# Patient Record
Sex: Male | Born: 1960 | Race: White | Hispanic: No | Smoking: Current some day smoker
Health system: Southern US, Community
[De-identification: ages and names within clinical notes are randomized; demographics above are authoritative.]

## PROBLEM LIST (undated history)

## (undated) DIAGNOSIS — J45909 Unspecified asthma, uncomplicated: Secondary | ICD-10-CM

## (undated) DIAGNOSIS — J189 Pneumonia, unspecified organism: Secondary | ICD-10-CM

## (undated) DIAGNOSIS — G893 Neoplasm related pain (acute) (chronic): Secondary | ICD-10-CM

## (undated) DIAGNOSIS — I10 Essential (primary) hypertension: Secondary | ICD-10-CM

## (undated) DIAGNOSIS — E119 Type 2 diabetes mellitus without complications: Secondary | ICD-10-CM

## (undated) DIAGNOSIS — C3490 Malignant neoplasm of unspecified part of unspecified bronchus or lung: Secondary | ICD-10-CM

## (undated) DIAGNOSIS — J449 Chronic obstructive pulmonary disease, unspecified: Secondary | ICD-10-CM

## (undated) HISTORY — PX: FRACTURE SURGERY: SHX138

## (undated) HISTORY — PX: TONSILLECTOMY: SUR1361

## (undated) HISTORY — DX: Neoplasm related pain (acute) (chronic): G89.3

---

## 2008-11-25 ENCOUNTER — Emergency Department: Payer: Self-pay | Admitting: Unknown Physician Specialty

## 2015-05-15 ENCOUNTER — Emergency Department
Admission: EM | Admit: 2015-05-15 | Discharge: 2015-05-15 | Disposition: A | Discharge status: Home / Self Care | Payer: 59 | Attending: Emergency Medicine | Admitting: Emergency Medicine

## 2015-05-15 ENCOUNTER — Encounter: Payer: Self-pay | Admitting: Medical Oncology

## 2015-05-15 DIAGNOSIS — I10 Essential (primary) hypertension: Secondary | ICD-10-CM | POA: Diagnosis not present

## 2015-05-15 DIAGNOSIS — E1165 Type 2 diabetes mellitus with hyperglycemia: Secondary | ICD-10-CM | POA: Diagnosis present

## 2015-05-15 DIAGNOSIS — F17200 Nicotine dependence, unspecified, uncomplicated: Secondary | ICD-10-CM | POA: Insufficient documentation

## 2015-05-15 DIAGNOSIS — R110 Nausea: Secondary | ICD-10-CM | POA: Diagnosis not present

## 2015-05-15 DIAGNOSIS — R739 Hyperglycemia, unspecified: Secondary | ICD-10-CM

## 2015-05-15 HISTORY — DX: Chronic obstructive pulmonary disease, unspecified: J44.9

## 2015-05-15 HISTORY — DX: Essential (primary) hypertension: I10

## 2015-05-15 HISTORY — DX: Type 2 diabetes mellitus without complications: E11.9

## 2015-05-15 LAB — CBC
HEMATOCRIT: 52.3 % — AB (ref 40.0–52.0)
Hemoglobin: 17.4 g/dL (ref 13.0–18.0)
MCH: 29.3 pg (ref 26.0–34.0)
MCHC: 33.2 g/dL (ref 32.0–36.0)
MCV: 88.2 fL (ref 80.0–100.0)
PLATELETS: 186 10*3/uL (ref 150–440)
RBC: 5.93 MIL/uL — AB (ref 4.40–5.90)
RDW: 12.7 % (ref 11.5–14.5)
WBC: 8.2 10*3/uL (ref 3.8–10.6)

## 2015-05-15 LAB — BASIC METABOLIC PANEL
Anion gap: 12 (ref 5–15)
BUN: 20 mg/dL (ref 6–20)
CHLORIDE: 86 mmol/L — AB (ref 101–111)
CO2: 29 mmol/L (ref 22–32)
CREATININE: 0.96 mg/dL (ref 0.61–1.24)
Calcium: 8.7 mg/dL — ABNORMAL LOW (ref 8.9–10.3)
GFR calc non Af Amer: >60 mL/min (ref >60)
Glucose, Bld: 446 mg/dL — ABNORMAL HIGH (ref 65–99)
POTASSIUM: 3.5 mmol/L (ref 3.5–5.1)
SODIUM: 127 mmol/L — AB (ref 135–145)

## 2015-05-15 LAB — URINALYSIS COMPLETE WITH MICROSCOPIC (ARMC ONLY)
BILIRUBIN URINE: NEGATIVE
Glucose, UA: >500 mg/dL — AB
Nitrite: NEGATIVE
PH: 5 (ref 5.0–8.0)
PROTEIN: 100 mg/dL — AB
Specific Gravity, Urine: 1.02 (ref 1.005–1.030)

## 2015-05-15 LAB — GLUCOSE, CAPILLARY
GLUCOSE-CAPILLARY: 399 mg/dL — AB (ref 65–99)
GLUCOSE-CAPILLARY: 413 mg/dL — AB (ref 65–99)
GLUCOSE-CAPILLARY: 371 mg/dL — AB (ref 65–99)

## 2015-05-15 MED ORDER — SODIUM CHLORIDE 0.9 % IV BOLUS (SEPSIS)
1000.0000 mL | INTRAVENOUS | Status: AC
  Administered 2015-05-15: 1000 mL via INTRAVENOUS

## 2015-05-15 NOTE — ED Provider Notes (Signed)
[facility] Emergency Department Provider Note  ____________________________________________  Time seen: Approximately 6:54 PM  I have reviewed the triage vital signs and the nursing notes.   HISTORY  Chief Complaint Hyperglycemia    HPI Gabriel Mendez is a 54 y.o. male with a history of diabetes on oral medication, hypertension, and COPD who presents with hyperglycemia.  He is reportedly to his primary care doctor and they checked his blood sugar and found it is greater than 470 they sent him to the emergency Department.  In general he feels well except that over the last few days he has had occasional episodes of nausea.  He denies headache, visual changes, weakness, chest pain, shortness of breath, abdominal pain, vomiting, dysuria.  He has been urinating more than usual.  Thanksgiving was last week, and he said he has not been sticking to his diabetic diet at all and has had many dietary indiscretions.  He has also been drinking a lot of (high-calorie) Gatorade recently.  A blood sugar greater than 400 consult to do's severe for him.  His dietary instructions are making it worse and nothing is making it better.   Past Medical History  Diagnosis Date  . Diabetes mellitus without complication ([place])   . Hypertension   . COPD (chronic obstructive pulmonary disease) ([facility])     There are no active problems to display for this patient.   History reviewed. No pertinent past surgical history.  No current outpatient prescriptions on file.  Allergies Shrimp  No family history on file.  Social History Social History  Substance Use Topics  . Smoking status: Current Every Day Smoker  . Smokeless tobacco: None  . Alcohol Use: No    Review of Systems Constitutional: No fever/chills Eyes: No visual changes. ENT: No sore throat. Cardiovascular: Denies chest pain. Respiratory: Denies shortness of breath. Gastrointestinal: No abdominal pain.  nausea, no  vomiting.  No diarrhea.  No constipation. Genitourinary: Negative for dysuria. Musculoskeletal: Negative for back pain. Skin: Negative for rash. Neurological: Negative for headaches, focal weakness or numbness.  10-point ROS otherwise negative.  ____________________________________________   PHYSICAL EXAM:  VITAL SIGNS: ED Triage Vitals  Enc Vitals Group     BP 05/15/15 1731 156/100 mmHg     Pulse Rate 05/15/15 1731 88     Resp 05/15/15 1731 18     Temp 05/15/15 1731 98.8 F (37.1 C)     Temp Source 05/15/15 1731 Oral     SpO2 05/15/15 1731 95 %     Weight 05/15/15 1731 208 lb (94.348 kg)     Height 05/15/15 1731 '5\' 8"'$  (1.727 m)     Head Cir --      Peak Flow --      Pain Score 05/15/15 1731 5     Pain Loc --      Pain Edu? --      Excl. in [place]? --     Constitutional: Alert and oriented. Well appearing and in no acute distress. Eyes: Conjunctivae are normal. PERRL. EOMI. Head: Atraumatic. Nose: No congestion/rhinnorhea. Mouth/Throat: Mucous membranes are moist.  Oropharynx non-erythematous. Neck: No stridor.   Cardiovascular: Normal rate, regular rhythm. Grossly normal heart sounds.  Good peripheral circulation. Respiratory: Normal respiratory effort.  No retractions. Lungs CTAB. Gastrointestinal: Soft and nontender. No distention. No abdominal bruits. No CVA tenderness. Musculoskeletal: No lower extremity tenderness nor edema.  No joint effusions. Neurologic:  Normal speech and language. No gross focal neurologic deficits are appreciated.  Skin:  Skin is warm, dry and intact. No rash noted. Psychiatric: Mood and affect are normal. Speech and behavior are normal.  ____________________________________________   LABS (all labs ordered are listed, but only abnormal results are displayed)  Labs Reviewed  BASIC METABOLIC PANEL - Abnormal; Notable for the following:    Sodium 127 (*)    Chloride 86 (*)    Glucose, Bld 446 (*)    Calcium 8.7 (*)    All other  components within normal limits  CBC - Abnormal; Notable for the following:    RBC 5.93 (*)    HCT 52.3 (*)    All other components within normal limits  GLUCOSE, CAPILLARY - Abnormal; Notable for the following:    Glucose-Capillary 399 (*)    All other components within normal limits  GLUCOSE, CAPILLARY - Abnormal; Notable for the following:    Glucose-Capillary 413 (*)    All other components within normal limits  URINALYSIS COMPLETEWITH MICROSCOPIC ([facility] ONLY)  CBG MONITORING, ED   ____________________________________________  EKG  Not indicated ____________________________________________  RADIOLOGY   No results found.  ____________________________________________   PROCEDURES  Procedure(s) performed: None  Critical Care performed: No ____________________________________________   INITIAL IMPRESSION / ASSESSMENT AND PLAN / ED COURSE  Pertinent labs & imaging results that were available during my care of the patient were reviewed by me and considered in my medical decision making (see chart for details).  The patient's blood sugar is elevated he is otherwise well and asymptomatic.  He is not in DKA and his anion gap is within normal limits.  I explained to him that we will give him a liter of fluids and see if we can bring his blood sugar down somewhat, but that IV insulin is not indicated.  Mostly he needs to be hydrated with water, stick to his diabetic diet, follow up as an outpatient.  The patient understands and agrees and is comfortably watching TV at this time.  ----------------------------------------- 9:26 PM on 05/15/2015 -----------------------------------------  Patient remains asymptomatic.  Blood sugar remains elevated at nearly 400, but I have no medical reason to keep the patient, and he very much wants to go home.  I gave my usual customary return precautions.  ____________________________________________  FINAL CLINICAL IMPRESSION(S) / ED  DIAGNOSES  Final diagnoses:  Hyperglycemia      NEW MEDICATIONS STARTED DURING THIS VISIT:  New Prescriptions   No medications on file     Hinda Kehr, MD 05/15/15 2126

## 2015-05-15 NOTE — Discharge Instructions (Signed)
As we discussed, though your blood sugar is running high, it is not dangerous at this time.  Making adjustments in the Emergency Department (ED) and possibly causing your glucose level to drop too low is more dangerous than continuing your current medications at this time until you can follow up with your clinic doctor.  Please continue your medications and follow up with your regular doctor as recommended in these documents.  If you develop new or worsening symptoms that concern you, please return to the Emergency Department.   Hyperglycemia Hyperglycemia occurs when the glucose (sugar) in your blood is too high. Hyperglycemia can happen for many reasons, but it most often happens to people who do not know they have diabetes or are not managing their diabetes properly.  CAUSES  Whether you have diabetes or not, there are other causes of hyperglycemia. Hyperglycemia can occur when you have diabetes, but it can also occur in other situations that you might not be as aware of, such as: Diabetes  If you have diabetes and are having problems controlling your blood glucose, hyperglycemia could occur because of some of the following reasons:  Not following your meal plan.  Not taking your diabetes medications or not taking it properly.  Exercising less or doing less activity than you normally do.  Being sick. Pre-diabetes  This cannot be ignored. Before people develop Type 2 diabetes, they almost always have "pre-diabetes." This is when your blood glucose levels are higher than normal, but not yet high enough to be diagnosed as diabetes. Research has shown that some long-term damage to the body, especially the heart and circulatory system, may already be occurring during pre-diabetes. If you take action to manage your blood glucose when you have pre-diabetes, you may delay or prevent Type 2 diabetes from developing. Stress  If you have diabetes, you may be "diet" controlled or on oral medications  or insulin to control your diabetes. However, you may find that your blood glucose is higher than usual in the hospital whether you have diabetes or not. This is often referred to as "stress hyperglycemia." Stress can elevate your blood glucose. This happens because of hormones put out by the body during times of stress. If stress has been the cause of your high blood glucose, it can be followed regularly by your caregiver. That way he/she can make sure your hyperglycemia does not continue to get worse or progress to diabetes. Steroids  Steroids are medications that act on the infection fighting system (immune system) to block inflammation or infection. One side effect can be a rise in blood glucose. Most people can produce enough extra insulin to allow for this rise, but for those who cannot, steroids make blood glucose levels go even higher. It is not unusual for steroid treatments to "uncover" diabetes that is developing. It is not always possible to determine if the hyperglycemia will go away after the steroids are stopped. A special blood test called an A1c is sometimes done to determine if your blood glucose was elevated before the steroids were started. SYMPTOMS  Thirsty.  Frequent urination.  Dry mouth.  Blurred vision.  Tired or fatigue.  Weakness.  Sleepy.  Tingling in feet or leg. DIAGNOSIS  Diagnosis is made by monitoring blood glucose in one or all of the following ways:  A1c test. This is a chemical found in your blood.  Fingerstick blood glucose monitoring.  Laboratory results. TREATMENT  First, knowing the cause of the hyperglycemia is important before the  hyperglycemia can be treated. Treatment may include, but is not be limited to:  Education.  Change or adjustment in medications.  Change or adjustment in meal plan.  Treatment for an illness, infection, etc.  More frequent blood glucose monitoring.  Change in exercise plan.  Decreasing or stopping  steroids.  Lifestyle changes. HOME CARE INSTRUCTIONS   Test your blood glucose as directed.  Exercise regularly. Your caregiver will give you instructions about exercise. Pre-diabetes or diabetes which comes on with stress is helped by exercising.  Eat wholesome, balanced meals. Eat often and at regular, fixed times. Your caregiver or nutritionist will give you a meal plan to guide your sugar intake.  Being at an ideal weight is important. If needed, losing as little as 10 to 15 pounds may help improve blood glucose levels. SEEK MEDICAL CARE IF:   You have questions about medicine, activity, or diet.  You continue to have symptoms (problems such as increased thirst, urination, or weight gain). SEEK IMMEDIATE MEDICAL CARE IF:   You are vomiting or have diarrhea.  Your breath smells fruity.  You are breathing faster or slower.  You are very sleepy or incoherent.  You have numbness, tingling, or pain in your feet or hands.  You have chest pain.  Your symptoms get worse even though you have been following your caregiver's orders.  If you have any other questions or concerns.   This information is not intended to replace advice given to you by your health care provider. Make sure you discuss any questions you have with your health care provider.   Document Released: 11/27/2000 Document Revised: 08/26/2011 Document Reviewed: 02/07/2015 Elsevier Interactive Patient Education [employer].

## 2015-05-15 NOTE — ED Notes (Signed)
Pt reports that he was going to [facility] for nausea when they checked his sugar and was told it was 413. Pt reports body aches.

## 2017-04-16 ENCOUNTER — Telehealth: Payer: Self-pay | Admitting: *Deleted

## 2017-04-16 NOTE — Telephone Encounter (Signed)
Attempted to call patient and notified that imaging needs to be put on a disk (CD).

## 2017-04-18 ENCOUNTER — Ambulatory Visit (INDEPENDENT_AMBULATORY_CARE_PROVIDER_SITE_OTHER): Payer: BLUE CROSS/BLUE SHIELD | Admitting: Internal Medicine

## 2017-04-18 ENCOUNTER — Encounter: Payer: Self-pay | Admitting: Internal Medicine

## 2017-04-18 VITALS — BP 144/100 | HR 88 | Ht 68.0 in

## 2017-04-18 DIAGNOSIS — F17210 Nicotine dependence, cigarettes, uncomplicated: Secondary | ICD-10-CM

## 2017-04-18 DIAGNOSIS — J42 Unspecified chronic bronchitis: Secondary | ICD-10-CM

## 2017-04-18 DIAGNOSIS — J181 Lobar pneumonia, unspecified organism: Secondary | ICD-10-CM | POA: Diagnosis not present

## 2017-04-18 NOTE — Patient Instructions (Addendum)
--  will check CT chest.  --Please get a copy of your xrays on a CD for me to review.   --Quitting smoking is the most important thing that you can do for your health.  --Quitting smoking will have greater affect on your health than any medicine that we can give you.   --The best way to quit is to set a quit date, usually a day that has meaning like someone's birthday.  --Start any medication prescribed for quitting one week before you quit date. Then toss out the cigarettes on your quit date.  --If you start smoking again, start from scratch--set another quit day and try again!

## 2017-04-18 NOTE — Progress Notes (Addendum)
[facility] Consultation      Assessment and Plan:  56 year old male with persistent changes of pneumonia.  Pneumonia versus lung mass.   --Non-resolving pneumonia concerning for resistant organism or post obstructive pneumonia.  --Per imaging report, the patient has a large area of pneumonia, images were not available for review today.  --Will send pt for CT chest, and asked that he bring in outside CXR films on CD for my review.  --Discussed with patient that I will contact him once he has had the CT chest, he may need a bronchoscopy vs. Continued surveillance.   Chronic bronchitis --Pt has wheezing on exam today, but has no complaints of dyspnea. Therefore will not prescribe inhaler at this time, his best intervention would be smoking cessation.  --Will need a PFT, will consider after viewing CT chest.   Nicotine abuse. --Discussed the importance of smoking cessation, spent > greater than 3 min in discussion.   Addendum 05/01/17; Images personally reviewed, outside chest x-ray 04/15/17 there is a right mid zone infiltrate; CT chest 04/28/17, there is consolidation/atelectasis of the predominantly anterior segment of the right upper lobe, there appears to be cut off of the right upper lobe bronchus.  There is an enlarged right paratracheal node, enlarged right 10 R/mass which wraps anterior to the right main stem bronchus, 11 R node, mild subcarinal, which may be contiguous with the right paratracheal node lymphadenopathy.  ---------Will need to schedule biopsy.    Date: 04/18/2017  MRN# [id number] NEEKO PHARO Nov 09, 1960  Referring Physician: [facility]  AUSTAN NICHOLL is a 56 y.o. old male seen in consultation for chief complaint of:    Chief Complaint  Patient presents with  . Advice Only    prod cough; recent PNA:     HPI:   His problems started in June, he was coughing up phlegm, and had a lot of chest congestion. He was coughing up green and  yellow sputum. He went to his doctor, got a Zpack, which helped somewhat but did not get better, went back and got a CXR which showed pneumonia. He was found to have pneumonia, got a course of augmentin for 10 days and felt that it helped tremendously. A follow up CXR report 2 weeks ago showed mild improvement but it still persists. He was then referred there.  He has not lost weight. He is smoking just over a ppd. He would like to quit but does not think that he is able. He works as a Pharmacist, community.  No family or personal history of cancer. He has had 4 broken ribs on the right side.  He denies reflux. He denies dyspnea. He does not snore at night and is not sleepy during the day.   Images are not currently available for review.  Review of reports, chest x-ray April 01, 2017, right anterior mid lung 7 cm x 4 cm x 11 cm irregular consolidation.   PMHX:   Past Medical History:  Diagnosis Date  . COPD (chronic obstructive pulmonary disease) ([place])   . Diabetes mellitus without complication ([place])   . Hypertension    Surgical Hx:  History reviewed. No pertinent surgical history. Family Hx:  History reviewed. No pertinent family history. Social Hx:   Social History  Substance Use Topics  . Smoking status: Current Every Day Smoker    Packs/day: 1.50  . Smokeless tobacco: Never Used  . Alcohol use No   Medication:    Current Outpatient Prescriptions:  .  acetaminophen (TYLENOL) 325 MG tablet, Take 650 mg by mouth every 8 (eight) hours as needed for mild pain., Disp: , Rfl:  .  albuterol (ACCUNEB) 0.63 MG/3ML nebulizer solution, Take 1 ampule by nebulization every 6 (six) hours as needed for wheezing., Disp: , Rfl:  .  albuterol (PROVENTIL HFA;VENTOLIN HFA) 108 (90 BASE) MCG/ACT inhaler, Inhale 2 puffs into the lungs every 6 (six) hours as needed for wheezing or shortness of breath., Disp: , Rfl:  .  beclomethasone (QVAR) 80 MCG/ACT inhaler, Inhale 1 puff into the lungs 2 (two) times daily.,  Disp: , Rfl:  .  glipiZIDE (GLUCOTROL) 5 MG tablet, Take 5 mg by mouth daily before breakfast., Disp: , Rfl:  .  lisinopril-hydrochlorothiazide (PRINZIDE,ZESTORETIC) 20-25 MG tablet, Take 1 tablet by mouth daily., Disp: , Rfl:  .  metFORMIN (GLUCOPHAGE) 1000 MG tablet, Take 1,000 mg by mouth 2 (two) times daily., Disp: , Rfl:    Allergies:  Shrimp [shellfish allergy]  Review of Systems: Gen:  Denies  fever, sweats, chills HEENT: Denies blurred vision, double vision. bleeds, sore throat Cvc:  No dizziness, chest pain. Resp:   Denies cough or sputum production, shortness of breath Gi: Denies swallowing difficulty, stomach pain. Gu:  Denies bladder incontinence, burning urine Ext:   No Joint pain, stiffness. Skin: No skin rash,  hives  Endoc:  No polyuria, polydipsia. Psych: No depression, insomnia. Other:  All other systems were reviewed with the patient and were negative other that what is mentioned in the HPI.   Physical Examination:   VS: BP (!) 144/100 (BP Location: Left Arm, Cuff Size: Normal)   Pulse 88   Ht 5\' 8"  (1.727 m)   SpO2 98%   General Appearance: No distress  Neuro:without focal findings,  speech normal,  HEENT: PERRLA, EOM intact.   Pulmonary: normal breath sounds, No wheezing.  CardiovascularNormal S1,S2.  No m/r/g.   Abdomen: Benign, Soft, non-tender. Renal:  No costovertebral tenderness  GU:  No performed at this time. Endoc: No evident thyromegaly, no signs of acromegaly. Skin:   warm, no rashes, no ecchymosis  Extremities: normal, no cyanosis, clubbing.  Other findings:    LABORATORY PANEL:   CBC No results for input(s): WBC, HGB, HCT, PLT in the last 168 hours. ------------------------------------------------------------------------------------------------------------------  Chemistries  No results for input(s): NA, K, CL, CO2, GLUCOSE, BUN, CREATININE, CALCIUM, MG, AST, ALT, ALKPHOS, BILITOT in the last 168 hours.  Invalid input(s):  GFRCGP ------------------------------------------------------------------------------------------------------------------  Cardiac Enzymes No results for input(s): TROPONINI in the last 168 hours. ------------------------------------------------------------  RADIOLOGY:  No results found.     Thank  you for the consultation and for allowing [facility] to assist in the care of your patient. Our recommendations are noted above.  Please contact us if we can be of further service.   Marda Stalker, MD.  Board Certified in Internal Medicine, Pulmonary Medicine, [place], and Sleep Medicine.  [facility] Office Number: [phone]  Patricia Pesa, M.D.  Merton Border, M.D  04/18/2017

## 2017-04-18 NOTE — H&P (View-Only) (Signed)
[name]      Assessment and Plan:  56 year old male with persistent changes of pneumonia.  Pneumonia versus lung mass.   --Non-resolving pneumonia concerning for resistant organism or post obstructive pneumonia.  --Per imaging report, the patient has a large area of pneumonia, images were not available for review today.  --Will send pt for CT chest, and asked that he bring in outside CXR films on CD for my review.  --Discussed with patient that I will contact him once he has had the CT chest, he may need a bronchoscopy vs. Continued surveillance.   Chronic bronchitis --Pt has wheezing on exam today, but has no complaints of dyspnea. Therefore will not prescribe inhaler at this time, his best intervention would be smoking cessation.  --Will need a PFT, will consider after viewing CT chest.   Nicotine abuse. --Discussed the importance of smoking cessation, spent > greater than 3 min in discussion.   Addendum 05/01/17; Images personally reviewed, outside chest x-ray 04/15/17 there is a right mid zone infiltrate; CT chest 04/28/17, there is consolidation/atelectasis of the predominantly anterior segment of the right upper lobe, there appears to be cut off of the right upper lobe bronchus.  There is an enlarged right paratracheal node, enlarged right 10 R/mass which wraps anterior to the right main stem bronchus, 11 R node, mild subcarinal, which may be contiguous with the right paratracheal node lymphadenopathy.  ---------Will need to schedule biopsy.    Date: 04/18/2017  MRN# [id number] Gabriel Mendez Jul 18, 1960  Referring Physician: [facility]  Gabriel Mendez is a 56 y.o. old male seen in consultation for chief complaint of:    Chief Complaint  Patient presents with  . Advice Only    prod cough; recent PNA:     HPI:   His problems started in June, he was coughing up phlegm, and had a lot of chest congestion. He was coughing up green and  yellow sputum. He went to his doctor, got a Zpack, which helped somewhat but did not get better, went back and got a CXR which showed pneumonia. He was found to have pneumonia, got a course of augmentin for 10 days and felt that it helped tremendously. A follow up CXR report 2 weeks ago showed mild improvement but it still persists. He was then referred there.  He has not lost weight. He is smoking just over a ppd. He would like to quit but does not think that he is able. He works as a Pharmacist, community.  No family or personal history of cancer. He has had 4 broken ribs on the right side.  He denies reflux. He denies dyspnea. He does not snore at night and is not sleepy during the day.   Images are not currently available for review.  Review of reports, chest x-ray April 01, 2017, right anterior mid lung 7 cm x 4 cm x 11 cm irregular consolidation.   PMHX:   Past Medical History:  Diagnosis Date  . COPD (chronic obstructive pulmonary disease) ([place])   . Diabetes mellitus without complication ([place])   . Hypertension    Surgical Hx:  History reviewed. No pertinent surgical history. Family Hx:  History reviewed. No pertinent family history. Social Hx:   Social History  Substance Use Topics  . Smoking status: Current Every Day Smoker    Packs/day: 1.50  . Smokeless tobacco: Never Used  . Alcohol use No   Medication:    Current Outpatient Prescriptions:  .  acetaminophen (TYLENOL) 325 MG tablet, Take 650 mg by mouth every 8 (eight) hours as needed for mild pain., Disp: , Rfl:  .  albuterol (ACCUNEB) 0.63 MG/3ML nebulizer solution, Take 1 ampule by nebulization every 6 (six) hours as needed for wheezing., Disp: , Rfl:  .  albuterol (PROVENTIL HFA;VENTOLIN HFA) 108 (90 BASE) MCG/ACT inhaler, Inhale 2 puffs into the lungs every 6 (six) hours as needed for wheezing or shortness of breath., Disp: , Rfl:  .  beclomethasone (QVAR) 80 MCG/ACT inhaler, Inhale 1 puff into the lungs 2 (two) times daily.,  Disp: , Rfl:  .  glipiZIDE (GLUCOTROL) 5 MG tablet, Take 5 mg by mouth daily before breakfast., Disp: , Rfl:  .  lisinopril-hydrochlorothiazide (PRINZIDE,ZESTORETIC) 20-25 MG tablet, Take 1 tablet by mouth daily., Disp: , Rfl:  .  metFORMIN (GLUCOPHAGE) 1000 MG tablet, Take 1,000 mg by mouth 2 (two) times daily., Disp: , Rfl:    Allergies:  Shrimp [shellfish allergy]  Review of Systems: Gen:  Denies  fever, sweats, chills HEENT: Denies blurred vision, double vision. bleeds, sore throat Cvc:  No dizziness, chest pain. Resp:   Denies cough or sputum production, shortness of breath Gi: Denies swallowing difficulty, stomach pain. Gu:  Denies bladder incontinence, burning urine Ext:   No Joint pain, stiffness. Skin: No skin rash,  hives  Endoc:  No polyuria, polydipsia. Psych: No depression, insomnia. Other:  All other systems were reviewed with the patient and were negative other that what is mentioned in the HPI.   Physical Examination:   VS: BP (!) 144/100 (BP Location: Left Arm, Cuff Size: Normal)   Pulse 88   Ht 5\' 8"  (1.727 m)   SpO2 98%   General Appearance: No distress  Neuro:without focal findings,  speech normal,  HEENT: PERRLA, EOM intact.   Pulmonary: normal breath sounds, No wheezing.  CardiovascularNormal S1,S2.  No m/r/g.   Abdomen: Benign, Soft, non-tender. Renal:  No costovertebral tenderness  GU:  No performed at this time. Endoc: No evident thyromegaly, no signs of acromegaly. Skin:   warm, no rashes, no ecchymosis  Extremities: normal, no cyanosis, clubbing.  Other findings:    LABORATORY PANEL:   CBC No results for input(s): WBC, HGB, HCT, PLT in the last 168 hours. ------------------------------------------------------------------------------------------------------------------  Chemistries  No results for input(s): NA, K, CL, CO2, GLUCOSE, BUN, CREATININE, CALCIUM, MG, AST, ALT, ALKPHOS, BILITOT in the last 168 hours.  Invalid input(s):  GFRCGP ------------------------------------------------------------------------------------------------------------------  Cardiac Enzymes No results for input(s): TROPONINI in the last 168 hours. ------------------------------------------------------------  RADIOLOGY:  No results found.     Thank  you for the consultation and for allowing [facility] to assist in the care of your patient. Our recommendations are noted above.  Please contact us if we can be of further service.   Marda Stalker, MD.  Board Certified in Internal Medicine, Pulmonary Medicine, [place], and Sleep Medicine.  [facility] Office Number: [phone]  Patricia Pesa, M.D.  Merton Border, M.D  04/18/2017

## 2017-04-28 ENCOUNTER — Ambulatory Visit
Admission: RE | Admit: 2017-04-28 | Discharge: 2017-04-28 | Disposition: A | Discharge status: Home / Self Care | Payer: BLUE CROSS/BLUE SHIELD | Source: Ambulatory Visit | Attending: Internal Medicine | Admitting: Internal Medicine

## 2017-04-28 ENCOUNTER — Telehealth: Payer: Self-pay | Admitting: Internal Medicine

## 2017-04-28 DIAGNOSIS — J181 Lobar pneumonia, unspecified organism: Secondary | ICD-10-CM | POA: Diagnosis present

## 2017-04-28 DIAGNOSIS — I2510 Atherosclerotic heart disease of native coronary artery without angina pectoris: Secondary | ICD-10-CM | POA: Insufficient documentation

## 2017-04-28 DIAGNOSIS — J42 Unspecified chronic bronchitis: Secondary | ICD-10-CM | POA: Diagnosis present

## 2017-04-28 DIAGNOSIS — I700 Atherosclerosis of aorta: Secondary | ICD-10-CM | POA: Insufficient documentation

## 2017-04-28 LAB — POCT I-STAT CREATININE: Creatinine, Ser: 0.8 mg/dL (ref 0.61–1.24)

## 2017-04-28 MED ORDER — IOPAMIDOL (ISOVUE-300) INJECTION 61%
75.0000 mL | Freq: Once | INTRAVENOUS | Status: AC | PRN
  Administered 2017-04-28: 75 mL via INTRAVENOUS

## 2017-04-28 NOTE — Telephone Encounter (Signed)
PT dropped off disc with chest x-ray and a letter from Radiologist  Placed in Nurse Box

## 2017-04-28 NOTE — Telephone Encounter (Signed)
Disc and report placed in DR's folder for review. Nothing further needed.

## 2017-05-01 ENCOUNTER — Other Ambulatory Visit: Payer: Self-pay | Admitting: Internal Medicine

## 2017-05-01 ENCOUNTER — Telehealth: Payer: Self-pay | Admitting: *Deleted

## 2017-05-01 DIAGNOSIS — R918 Other nonspecific abnormal finding of lung field: Secondary | ICD-10-CM

## 2017-05-01 NOTE — Telephone Encounter (Signed)
-----   Message from Laverle Hobby, MD sent at 05/01/2017  9:17 AM EST ----- Regarding: Pls schedule EBUS Spoke with Mr Schoch about CT chest and CXR, will need to schedule EBUS bronchoscopy (no fluoro).

## 2017-05-02 ENCOUNTER — Telehealth: Payer: Self-pay | Admitting: *Deleted

## 2017-05-02 NOTE — Telephone Encounter (Signed)
Called patient and made aware CXR disc has been reviewed and ready for pick. It will be placed at front desk. Nothing further needed.

## 2017-05-05 ENCOUNTER — Telehealth: Payer: Self-pay | Admitting: *Deleted

## 2017-05-05 NOTE — Telephone Encounter (Signed)
EBUS --- Ramachandran scheduled for 05/12/17 at 1 pm. Pre-Admission 05/07/17 by phone 1-5.  Patient aware

## 2017-05-07 ENCOUNTER — Encounter
Admission: RE | Admit: 2017-05-07 | Discharge: 2017-05-07 | Disposition: A | Discharge status: Home / Self Care | Payer: BLUE CROSS/BLUE SHIELD | Source: Ambulatory Visit | Attending: Internal Medicine | Admitting: Internal Medicine

## 2017-05-07 ENCOUNTER — Other Ambulatory Visit: Payer: Self-pay

## 2017-05-07 HISTORY — DX: Pneumonia, unspecified organism: J18.9

## 2017-05-07 NOTE — Patient Instructions (Signed)
Your procedure is scheduled on: 05/12/17 Report to Day Surgery. [facility] SECOND FLOOR To find out your arrival time please call [phone] between 1PM - 3PM on 05/09/17.  Remember: Instructions that are not followed completely may result in serious medical risk, up to and including death, or upon the discretion of your surgeon and anesthesiologist your surgery may need to be rescheduled.     _X__ 1. Do not eat food after midnight the night before your procedure.                 No gum chewing or hard candies. You may drink clear liquids up to 2 hours                 before you are scheduled to arrive for your surgery- DO not drink clear                 liquids within 2 hours of the start of your surgery.                 Clear Liquids include:  water, apple juice without pulp, clear carbohydrate                 drink such as Clearfast of Gartorade, Black Coffee or Tea (Do not add                 anything to coffee or tea).     _X__ 2.  No Alcohol for 24 hours before or after surgery.   _X__ 3.  Do Not Smoke or use e-cigarettes For 24 Hours Prior to Your Surgery.                 Do not use any chewable tobacco products for at least 6 hours prior to                 surgery.  ____  4.  Bring all medications with you on the day of surgery if instructed.   __X__  5.  Notify your doctor if there is any change in your medical condition      (cold, fever, infections).     Do not wear jewelry, make-up, hairpins, clips or nail polish. Do not wear lotions, powders, or perfumes. You may wear deodorant. Do not shave 48 hours prior to surgery. Men may shave face and neck. Do not bring valuables to the hospital.    [facility] is not responsible for any belongings or valuables.  Contacts, dentures or bridgework may not be worn into surgery. Leave your suitcase in the car. After surgery it may be brought to your room. For patients admitted to the hospital, discharge  time is determined by your treatment team.   Patients discharged the day of surgery will not be allowed to drive home.   ____ Take these medicines the morning of surgery with A SIP OF WATER:    1.  2.   3.   4.  5.  6.  ____ Fleet Enema (as directed)   ____ Use CHG Soap as directed  _X___ Use inhalers on the day of surgery    DO NEB TX AM SURGERY AND BRING INHALERS  __X__ Stop metformin 2 days prior to surgery    ____ Take 1/2 of usual insulin dose the night before surgery. No insulin the morning          of surgery.   ____ Stop Coumadin/Plavix/aspirin on  ____ Stop Anti-inflammatories on  ____ Stop supplements until after surgery.    ____ Bring C-Pap to the hospital.

## 2017-05-07 NOTE — Pre-Procedure Instructions (Signed)
COIULD NOT COME PREOP  FOR EKG/CBC/METB. ORDERED FOR AM SURGERY

## 2017-05-12 ENCOUNTER — Ambulatory Visit: Payer: BLUE CROSS/BLUE SHIELD

## 2017-05-12 ENCOUNTER — Ambulatory Visit: Payer: BLUE CROSS/BLUE SHIELD | Admitting: Certified Registered"

## 2017-05-12 ENCOUNTER — Encounter
Admission: RE | Disposition: A | Discharge status: Home / Self Care | Payer: Self-pay | Source: Ambulatory Visit | Attending: Internal Medicine

## 2017-05-12 ENCOUNTER — Encounter: Payer: Self-pay | Admitting: *Deleted

## 2017-05-12 ENCOUNTER — Ambulatory Visit
Admission: RE | Admit: 2017-05-12 | Discharge: 2017-05-12 | Disposition: A | Discharge status: Home / Self Care | Payer: BLUE CROSS/BLUE SHIELD | Source: Ambulatory Visit | Attending: Internal Medicine | Admitting: Internal Medicine

## 2017-05-12 DIAGNOSIS — E119 Type 2 diabetes mellitus without complications: Secondary | ICD-10-CM | POA: Diagnosis not present

## 2017-05-12 DIAGNOSIS — Z79899 Other long term (current) drug therapy: Secondary | ICD-10-CM | POA: Insufficient documentation

## 2017-05-12 DIAGNOSIS — C3411 Malignant neoplasm of upper lobe, right bronchus or lung: Secondary | ICD-10-CM | POA: Diagnosis not present

## 2017-05-12 DIAGNOSIS — R918 Other nonspecific abnormal finding of lung field: Secondary | ICD-10-CM | POA: Diagnosis not present

## 2017-05-12 DIAGNOSIS — Z01818 Encounter for other preprocedural examination: Secondary | ICD-10-CM

## 2017-05-12 DIAGNOSIS — Z9889 Other specified postprocedural states: Secondary | ICD-10-CM

## 2017-05-12 DIAGNOSIS — Z8701 Personal history of pneumonia (recurrent): Secondary | ICD-10-CM | POA: Diagnosis not present

## 2017-05-12 DIAGNOSIS — C771 Secondary and unspecified malignant neoplasm of intrathoracic lymph nodes: Secondary | ICD-10-CM | POA: Diagnosis not present

## 2017-05-12 DIAGNOSIS — F17210 Nicotine dependence, cigarettes, uncomplicated: Secondary | ICD-10-CM | POA: Diagnosis not present

## 2017-05-12 DIAGNOSIS — Z7984 Long term (current) use of oral hypoglycemic drugs: Secondary | ICD-10-CM | POA: Insufficient documentation

## 2017-05-12 DIAGNOSIS — J449 Chronic obstructive pulmonary disease, unspecified: Secondary | ICD-10-CM | POA: Insufficient documentation

## 2017-05-12 DIAGNOSIS — Z791 Long term (current) use of non-steroidal anti-inflammatories (NSAID): Secondary | ICD-10-CM | POA: Diagnosis not present

## 2017-05-12 DIAGNOSIS — I10 Essential (primary) hypertension: Secondary | ICD-10-CM | POA: Diagnosis not present

## 2017-05-12 HISTORY — PX: ENDOBRONCHIAL ULTRASOUND: SHX5096

## 2017-05-12 LAB — CBC
HCT: 43 % (ref 40.0–52.0)
Hemoglobin: 14.2 g/dL (ref 13.0–18.0)
MCH: 28.4 pg (ref 26.0–34.0)
MCHC: 33 g/dL (ref 32.0–36.0)
MCV: 86 fL (ref 80.0–100.0)
PLATELETS: 416 10*3/uL (ref 150–440)
RBC: 5 MIL/uL (ref 4.40–5.90)
RDW: 13.3 % (ref 11.5–14.5)
WBC: 14.9 10*3/uL — AB (ref 3.8–10.6)

## 2017-05-12 LAB — BASIC METABOLIC PANEL
ANION GAP: 11 (ref 5–15)
BUN: 16 mg/dL (ref 6–20)
CHLORIDE: 97 mmol/L — AB (ref 101–111)
CO2: 26 mmol/L (ref 22–32)
Calcium: 9.2 mg/dL (ref 8.9–10.3)
Creatinine, Ser: 0.7 mg/dL (ref 0.61–1.24)
GFR calc Af Amer: >60 mL/min (ref >60)
GFR calc non Af Amer: >60 mL/min (ref >60)
GLUCOSE: 201 mg/dL — AB (ref 65–99)
Potassium: 3.9 mmol/L (ref 3.5–5.1)
Sodium: 134 mmol/L — ABNORMAL LOW (ref 135–145)

## 2017-05-12 LAB — GLUCOSE, CAPILLARY
GLUCOSE-CAPILLARY: 190 mg/dL — AB (ref 65–99)
GLUCOSE-CAPILLARY: 178 mg/dL — AB (ref 65–99)

## 2017-05-12 SURGERY — ENDOBRONCHIAL ULTRASOUND (EBUS)
Anesthesia: General

## 2017-05-12 MED ORDER — DEXAMETHASONE SODIUM PHOSPHATE 10 MG/ML IJ SOLN
INTRAMUSCULAR | Status: DC | PRN
  Administered 2017-05-12: 10 mg via INTRAVENOUS

## 2017-05-12 MED ORDER — FENTANYL CITRATE (PF) 100 MCG/2ML IJ SOLN: 25.0000 ug | INTRAMUSCULAR | Status: DC | PRN

## 2017-05-12 MED ORDER — PROPOFOL 10 MG/ML IV BOLUS
INTRAVENOUS | Status: AC
  Filled 2017-05-12: qty 20

## 2017-05-12 MED ORDER — ONDANSETRON HCL 4 MG/2ML IJ SOLN: 4.0000 mg | Freq: Once | INTRAMUSCULAR | Status: DC | PRN

## 2017-05-12 MED ORDER — SUCCINYLCHOLINE CHLORIDE 20 MG/ML IJ SOLN
INTRAMUSCULAR | Status: DC | PRN
  Administered 2017-05-12: 100 mg via INTRAVENOUS

## 2017-05-12 MED ORDER — DEXAMETHASONE SODIUM PHOSPHATE 10 MG/ML IJ SOLN
INTRAMUSCULAR | Status: AC
  Filled 2017-05-12: qty 1

## 2017-05-12 MED ORDER — LIDOCAINE HCL 2 % EX GEL
1.0000 "application " | Freq: Once | CUTANEOUS | Status: DC
  Filled 2017-05-12: qty 5

## 2017-05-12 MED ORDER — SUGAMMADEX SODIUM 200 MG/2ML IV SOLN
INTRAVENOUS | Status: AC
  Filled 2017-05-12: qty 2

## 2017-05-12 MED ORDER — FENTANYL CITRATE (PF) 100 MCG/2ML IJ SOLN
INTRAMUSCULAR | Status: DC | PRN
  Administered 2017-05-12: 100 ug via INTRAVENOUS

## 2017-05-12 MED ORDER — SUCCINYLCHOLINE CHLORIDE 20 MG/ML IJ SOLN
INTRAMUSCULAR | Status: AC
  Filled 2017-05-12: qty 1

## 2017-05-12 MED ORDER — IPRATROPIUM-ALBUTEROL 0.5-2.5 (3) MG/3ML IN SOLN: 3.0000 mL | Freq: Four times a day (QID) | RESPIRATORY_TRACT | Status: DC

## 2017-05-12 MED ORDER — ONDANSETRON HCL 4 MG/2ML IJ SOLN
INTRAMUSCULAR | Status: AC
  Filled 2017-05-12: qty 2

## 2017-05-12 MED ORDER — GLYCOPYRROLATE 0.2 MG/ML IJ SOLN
INTRAMUSCULAR | Status: DC | PRN
  Administered 2017-05-12: 0.2 mg via INTRAVENOUS

## 2017-05-12 MED ORDER — ROCURONIUM BROMIDE 50 MG/5ML IV SOLN
INTRAVENOUS | Status: AC
  Filled 2017-05-12: qty 1

## 2017-05-12 MED ORDER — MIDAZOLAM HCL 2 MG/2ML IJ SOLN
INTRAMUSCULAR | Status: AC
  Filled 2017-05-12: qty 2

## 2017-05-12 MED ORDER — FAMOTIDINE 20 MG PO TABS
20.0000 mg | ORAL_TABLET | Freq: Once | ORAL | Status: AC
  Administered 2017-05-12: 20 mg via ORAL

## 2017-05-12 MED ORDER — ONDANSETRON HCL 4 MG/2ML IJ SOLN
INTRAMUSCULAR | Status: DC | PRN
  Administered 2017-05-12: 4 mg via INTRAVENOUS

## 2017-05-12 MED ORDER — PHENYLEPHRINE HCL 0.25 % NA SOLN
1.0000 | Freq: Four times a day (QID) | NASAL | Status: DC | PRN
  Filled 2017-05-12: qty 15

## 2017-05-12 MED ORDER — ACETAMINOPHEN 500 MG PO TABS
ORAL_TABLET | ORAL | Status: AC
  Administered 2017-05-12: 1000 mg via ORAL
  Filled 2017-05-12: qty 2

## 2017-05-12 MED ORDER — FENTANYL CITRATE (PF) 100 MCG/2ML IJ SOLN
INTRAMUSCULAR | Status: AC
  Filled 2017-05-12: qty 2

## 2017-05-12 MED ORDER — ROCURONIUM BROMIDE 100 MG/10ML IV SOLN
INTRAVENOUS | Status: DC | PRN
  Administered 2017-05-12: 10 mg via INTRAVENOUS
  Administered 2017-05-12: 20 mg via INTRAVENOUS

## 2017-05-12 MED ORDER — SODIUM CHLORIDE 0.9 % IV SOLN
INTRAVENOUS | Status: DC
  Administered 2017-05-12: 12:00:00 via INTRAVENOUS

## 2017-05-12 MED ORDER — IPRATROPIUM-ALBUTEROL 0.5-2.5 (3) MG/3ML IN SOLN
RESPIRATORY_TRACT | Status: AC
  Administered 2017-05-12: 3 mL
  Filled 2017-05-12: qty 3

## 2017-05-12 MED ORDER — SUGAMMADEX SODIUM 200 MG/2ML IV SOLN
INTRAVENOUS | Status: DC | PRN
  Administered 2017-05-12: 180 mg via INTRAVENOUS

## 2017-05-12 MED ORDER — BUTAMBEN-TETRACAINE-BENZOCAINE 2-2-14 % EX AERO
1.0000 | INHALATION_SPRAY | Freq: Once | CUTANEOUS | Status: DC
  Filled 2017-05-12: qty 20

## 2017-05-12 MED ORDER — FAMOTIDINE 20 MG PO TABS
ORAL_TABLET | ORAL | Status: AC
  Filled 2017-05-12: qty 1

## 2017-05-12 MED ORDER — PROPOFOL 10 MG/ML IV BOLUS
INTRAVENOUS | Status: DC | PRN
  Administered 2017-05-12: 120 mg via INTRAVENOUS

## 2017-05-12 MED ORDER — ACETAMINOPHEN 500 MG PO TABS
1000.0000 mg | ORAL_TABLET | Freq: Once | ORAL | Status: AC
  Administered 2017-05-12: 1000 mg via ORAL

## 2017-05-12 NOTE — Procedures (Signed)
  [facility]            Bronchoscopy Note   FINDINGS/SUMMARY:   -Enlarged mediastinal lymph nodes seen in the right hilar, subcarinal, right paratracheal areas.  All were sampled by EBUS guided needle biopsy. -Abnormal mucosa in the right mainstem extending to the right lower lobe, with possible tumor studding.  Cytology brush was taken in the right lower lobe endobronchially. 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 transbronchial cytology brushing, endobronchial forceps biopsies, bronchoalveolar lavage were all performed in the right upper lobe.  Indication: right lung mass seen on CT chest.  The patient (or their representative) was informed of the risks (including but not limited to bleeding, infection, respiratory failure, lung injury, tooth/oral injury) and benefits of the procedure and gave consent, see chart.   Pre-op diagnosis: Right lung mass, enlarged lymph nodes.  Post-op diagnosis: same, RUL endobronchial lesion.  Estimated blood loss: 20cc  Medications for procedure: Pls see anesthesia not.   Procedure description: After obtaining informed consent, a timeout was called to confirm the patient and the procedure.  Patient was intubated by anesthesia services please see their note for further details.  Bronchoscope was passed via the endotracheal tube, the Harmon Pier scope was then taken to the subcarinal area.  3 passes were taken with good returns.  Bronchoscope was then taken to the right paratracheal area and enlarged lymph node was seen in this area, 2 passes were taken with good returns.  Bronchoscope was then taken to the right hilar lymph node station, and a large lymph node/mass was seen in this area, he was guided needle biopsy was taken here as well. Adequate tissue appear to have been obtained by EBUS bronchoscopy, rapid on-site cytology was performed with findings of atypical cells in the subcarinal node.  The Harmon Pier scope was then removed, the  white light bronchoscope was advanced via the endotracheal tube.  An anatomical tumor was performed in the left lung, all segments were visualized no abnormalities were noted. There were copious mucosal secretions throughout both lungs which were suctioned and removed. On entering the right lung there was abnormal mucosa with studying and external compression seen from the right bronchus intermedius through to the right lower lobe.  There was near complete them (90%) closure of the right upper lobe bronchus due to endobronchial tumor.  A total of 4 cc of topical epinephrine was applied. Cytology transbronchial brushing was performed of the right lower lobe x2.  Bronchoscope was then taken to the right upper lobe, the bronchoscope could not be passed into the right upper lobe due to the presence of the endobronchial lesion.  Cytology brush could be passed through the narrow opening but could not be passed all the way distal.  Cytology brushes were taken to this area.  This was followed by endobronchial forceps biopsies of the mass. Subsequently bronchoalveolar lavage performed x2 of the right upper lobe with results sent for both cytology and microbiology.  As adequate samples have been obtained at that time the bronchoscope was removed.    Condition post procedure: Stable   Complications: None noted.     Marda Stalker, MD.  Board Certified in Internal Medicine, Pulmonary Medicine, [place], and Sleep Medicine.  [facility] Office Number: [phone]  Patricia Pesa, M.D.  Cheral Marker, M.D  05/12/2017

## 2017-05-12 NOTE — Interval H&P Note (Signed)
History and Physical Interval Note:  05/12/2017 11:48 AM  Gabriel Mendez  has presented today for surgery, with the diagnosis of LUNG NODULE  The various methods of treatment have been discussed with the patient and family. After consideration of risks, benefits and other options for treatment, the patient has consented to  Procedure(s): ENDOBRONCHIAL ULTRASOUND (N/A) as a surgical intervention .  The patient's history has been reviewed, patient examined, no change in status, stable for surgery.  I have reviewed the patient's chart and labs.  Questions were answered to the patient's satisfaction.     Laverle Hobby

## 2017-05-12 NOTE — Anesthesia Preprocedure Evaluation (Signed)
Anesthesia Evaluation  Patient identified by MRN, date of birth, ID band Patient awake    Reviewed: Allergy & Precautions, NPO status , Patient's Chart, lab work & pertinent test results  Airway Mallampati: II  TM Distance: >3 FB     Dental   Pulmonary pneumonia, resolved, COPD, Current Smoker,    Pulmonary exam normal        Cardiovascular hypertension, Pt. on medications Normal cardiovascular exam     Neuro/Psych negative neurological ROS  negative psych ROS   GI/Hepatic negative GI ROS, Neg liver ROS,   Endo/Other  diabetes, Well Controlled, Type 2, Oral Hypoglycemic Agents  Renal/GU negative Renal ROS     Musculoskeletal negative musculoskeletal ROS (+)   Abdominal Normal abdominal exam  (+)   Peds  Hematology negative hematology ROS (+)   Anesthesia Other Findings   Reproductive/Obstetrics                             Anesthesia Physical Anesthesia Plan  ASA: III  Anesthesia Plan: General   Post-op Pain Management:    Induction: Intravenous, Cricoid pressure planned and Rapid sequence  PONV Risk Score and Plan:   Airway Management Planned: Oral ETT  Additional Equipment:   Intra-op Plan:   Post-operative Plan: Extubation in OR  Informed Consent: I have reviewed the patients History and Physical, chart, labs and discussed the procedure including the risks, benefits and alternatives for the proposed anesthesia with the patient or authorized representative who has indicated his/her understanding and acceptance.   Dental advisory given  Plan Discussed with: CRNA and Surgeon  Anesthesia Plan Comments:         Anesthesia Quick Evaluation

## 2017-05-12 NOTE — Anesthesia Procedure Notes (Signed)
Procedure Name: Intubation Date/Time: 05/12/2017 1:16 PM Performed by: Jonna Clark, CRNA Pre-anesthesia Checklist: Patient identified, Patient being monitored, Timeout performed, Emergency Drugs available and Suction available Patient Re-evaluated:Patient Re-evaluated prior to induction Oxygen Delivery Method: Circle system utilized Preoxygenation: Pre-oxygenation with 100% oxygen Induction Type: IV induction Ventilation: Mask ventilation without difficulty Laryngoscope Size: Miller and 2 Grade View: Grade I Tube type: Oral Tube size: 8.0 mm Number of attempts: 1 Airway Equipment and Method: Stylet Placement Confirmation: ETT inserted through vocal cords under direct vision,  positive ETCO2 and breath sounds checked- equal and bilateral Secured at: 21 cm Tube secured with: Tape Dental Injury: Teeth and Oropharynx as per pre-operative assessment

## 2017-05-12 NOTE — Anesthesia Post-op Follow-up Note (Signed)
Anesthesia QCDR form completed.        

## 2017-05-12 NOTE — Anesthesia Postprocedure Evaluation (Signed)
Anesthesia Post Note  Patient: Gabriel Mendez  Procedure(s) Performed: ENDOBRONCHIAL ULTRASOUND (N/A )  Patient location during evaluation: PACU Anesthesia Type: General Level of consciousness: awake and alert and oriented Pain management: pain level controlled Vital Signs Assessment: post-procedure vital signs reviewed and stable Respiratory status: spontaneous breathing Cardiovascular status: blood pressure returned to baseline Anesthetic complications: no     Last Vitals:  Vitals:   05/12/17 1503 05/12/17 1526  BP: (!) 131/55 (!) 129/109  Pulse: 98 100  Resp: 20 18  Temp: 36.9 C   SpO2: 93% 95%    Last Pain:  Vitals:   05/12/17 1526  TempSrc:   PainSc: 4                  Lorayne Getchell

## 2017-05-12 NOTE — Transfer of Care (Signed)
Immediate Anesthesia Transfer of Care Note  Patient: Gabriel Mendez  Procedure(s) Performed: ENDOBRONCHIAL ULTRASOUND (N/A )  Patient Location: PACU  Anesthesia Type:General  Level of Consciousness: awake, alert  and oriented  Airway & Oxygen Therapy: Patient Spontanous Breathing and Patient connected to face mask oxygen  Post-op Assessment: Report given to RN and Post -op Vital signs reviewed and stable  Post vital signs: Reviewed and stable  Last Vitals:  Vitals:   05/12/17 1142 05/12/17 1415  BP: (!) 146/82 116/71  Pulse: 85 (!) 113  Resp: 20 15  Temp: 36.6 C 37.8 C  SpO2: 96% 98%    Last Pain:  Vitals:   05/12/17 1142  TempSrc: Tympanic         Complications: No apparent anesthesia complications

## 2017-05-12 NOTE — Discharge Instructions (Signed)
AMBULATORY SURGERY  DISCHARGE INSTRUCTIONS  1) The drugs that you were given will stay in your system until tomorrow so for the next 24 hours you should not: A) Drive an automobile B) Make any legal decisions C) Drink any alcoholic beverage  2) You may resume regular meals tomorrow.  Today it is better to start with liquids and gradually work up to solid foods. You may eat anything you prefer, but it is better to start with liquids, then soup and crackers, and gradually work up to solid foods.  3) Please notify your doctor immediately if you have any unusual bleeding, trouble breathing, redness and pain at the surgery site, drainage, fever, or pain not relieved by medication.  Additional Instructions:  Please contact your physician with any problems or Same Day Surgery at [phone], Monday through Friday 6 am to 4 pm, or [place] [facility] number at [phone].

## 2017-05-13 ENCOUNTER — Other Ambulatory Visit: Payer: Self-pay | Admitting: Pathology

## 2017-05-13 ENCOUNTER — Encounter: Payer: Self-pay | Admitting: Internal Medicine

## 2017-05-13 LAB — CYTOLOGY - NON PAP

## 2017-05-13 LAB — ACID FAST SMEAR (AFB)

## 2017-05-13 LAB — ACID FAST SMEAR (AFB, MYCOBACTERIA): Acid Fast Smear: NEGATIVE

## 2017-05-13 LAB — SURGICAL PATHOLOGY

## 2017-05-15 ENCOUNTER — Encounter: Payer: Self-pay | Admitting: *Deleted

## 2017-05-15 NOTE — Progress Notes (Signed)
  Oncology Nurse Navigator Documentation  Navigator Location: [facility] (05/15/17 1400) Referral date to RadOnc/MedOnc: 05/15/17 (05/15/17 1400) )Navigator Encounter Type: Introductory phone call (05/15/17 1400)   Abnormal Finding Date: 04/28/17 (05/15/17 1400) Confirmed Diagnosis Date: 05/13/17 (05/15/17 1400)                   Barriers/Navigation Needs: Coordination of Care (05/15/17 1400)   Interventions: Coordination of Care (05/15/17 1400)   Coordination of Care: Appts (05/15/17 1400)        Acuity: Level 2 (05/15/17 1400)   Acuity Level 2: Initial guidance, education and coordination as needed;Educational needs;Assistance expediting appointments (05/15/17 1400)    phone call made to patient to give appt information and to introduce to navigator services. appt given to see Dr. Grayland Ormond in [place] on Fri 11/30 at 9:15am. Instructed pt to arrive at 9am to register. Contact info given and instructed to call with any questions or needs. Pt verbalized understanding and confirmed appt.  Time Spent with Patient: 30 (05/15/17 1400)

## 2017-05-16 ENCOUNTER — Encounter: Payer: Self-pay | Admitting: *Deleted

## 2017-05-16 ENCOUNTER — Other Ambulatory Visit: Payer: Self-pay | Admitting: Internal Medicine

## 2017-05-16 ENCOUNTER — Encounter: Payer: Self-pay | Admitting: Oncology

## 2017-05-16 ENCOUNTER — Inpatient Hospital Stay: Payer: BLUE CROSS/BLUE SHIELD | Attending: Oncology | Admitting: Oncology

## 2017-05-16 DIAGNOSIS — F17210 Nicotine dependence, cigarettes, uncomplicated: Secondary | ICD-10-CM | POA: Diagnosis not present

## 2017-05-16 DIAGNOSIS — E119 Type 2 diabetes mellitus without complications: Secondary | ICD-10-CM | POA: Diagnosis not present

## 2017-05-16 DIAGNOSIS — J449 Chronic obstructive pulmonary disease, unspecified: Secondary | ICD-10-CM | POA: Diagnosis not present

## 2017-05-16 DIAGNOSIS — Z8701 Personal history of pneumonia (recurrent): Secondary | ICD-10-CM | POA: Insufficient documentation

## 2017-05-16 DIAGNOSIS — R05 Cough: Secondary | ICD-10-CM

## 2017-05-16 DIAGNOSIS — I700 Atherosclerosis of aorta: Secondary | ICD-10-CM | POA: Insufficient documentation

## 2017-05-16 DIAGNOSIS — C3491 Malignant neoplasm of unspecified part of right bronchus or lung: Secondary | ICD-10-CM

## 2017-05-16 DIAGNOSIS — C3411 Malignant neoplasm of upper lobe, right bronchus or lung: Secondary | ICD-10-CM | POA: Insufficient documentation

## 2017-05-16 DIAGNOSIS — I2510 Atherosclerotic heart disease of native coronary artery without angina pectoris: Secondary | ICD-10-CM | POA: Diagnosis not present

## 2017-05-16 DIAGNOSIS — Z79899 Other long term (current) drug therapy: Secondary | ICD-10-CM | POA: Diagnosis not present

## 2017-05-16 DIAGNOSIS — C3492 Malignant neoplasm of unspecified part of left bronchus or lung: Secondary | ICD-10-CM | POA: Insufficient documentation

## 2017-05-16 DIAGNOSIS — Z7189 Other specified counseling: Secondary | ICD-10-CM | POA: Insufficient documentation

## 2017-05-16 DIAGNOSIS — I10 Essential (primary) hypertension: Secondary | ICD-10-CM | POA: Insufficient documentation

## 2017-05-16 DIAGNOSIS — Z7984 Long term (current) use of oral hypoglycemic drugs: Secondary | ICD-10-CM | POA: Diagnosis not present

## 2017-05-16 LAB — CULTURE, RESPIRATORY

## 2017-05-16 LAB — CULTURE, RESPIRATORY W GRAM STAIN

## 2017-05-16 MED ORDER — SULFAMETHOXAZOLE-TRIMETHOPRIM 400-80 MG PO TABS: 1.0000 | ORAL_TABLET | Freq: Two times a day (BID) | ORAL | 0 refills | Status: AC

## 2017-05-16 NOTE — Progress Notes (Signed)
  Oncology Nurse Navigator Documentation  Navigator Location: [facility] (05/16/17 1400)   )Navigator Encounter Type: Initial MedOnc (05/16/17 1400)                       Treatment Phase: Pre-Tx/Tx Discussion (05/16/17 1400) Barriers/Navigation Needs: Coordination of Care;Education (05/16/17 1400) Education: Understanding Cancer/ Treatment Options;Newly Diagnosed Cancer Education (05/16/17 1400) Interventions: Coordination of Care (05/16/17 1400)   Coordination of Care: Appts;Radiology (05/16/17 1400)           met with patient during initial med-onc consultation with Dr. Grayland Ormond. All questions answered at the time of visit. Pt given education materials regarding diagnosis and information regarding supportive services available. Upcoming appts reviewed with patient. Informed pt that I will call him once his appts with Dr. Grayland Ormond and Dr. Baruch Gouty have been scheduled. Contact info given and instructed pt to call if has any further questions or needs. Pt verbalized understanding.        Time Spent with Patient: 60 (05/16/17 1400)

## 2017-05-16 NOTE — Progress Notes (Signed)
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  INTERVAL HISTORY: Patient is a 56 year old male who presented to his primary care physician with a persistent cough.  His symptoms mildly improved with antibiotics he was referred to the pulmonologist.  Further workup included CT scan endoscopy which revealed stated lung cancer.  He continues to have a cough, but otherwise feels well.  He has no neurologic complaints.  He denies any recent fevers.  He has a good appetite and denies weight loss.  He denies any chest pain, shortness of breath, or hemoptysis.  He has no nausea, vomiting, constipation, or diarrhea.  He has no urinary complaints.  Patient offers no further specific complaints today.  REVIEW OF SYSTEMS:   Review of Systems  Constitutional: Negative.  Negative for fever, malaise/fatigue and weight loss.  Respiratory: Positive for cough. Negative for hemoptysis and shortness of breath.   Cardiovascular: Negative.  Negative for chest pain and leg swelling.  Gastrointestinal: Negative.  Negative for abdominal pain.  Genitourinary: Negative.   Musculoskeletal: Negative.   Skin: Negative.  Negative for rash.  Neurological: Negative.  Negative for sensory change and weakness.  Psychiatric/Behavioral: Negative.  The patient is not nervous/anxious.     As per HPI. Otherwise, a complete review of systems is negative.  PAST MEDICAL HISTORY: Past Medical History:  Diagnosis Date  . COPD (chronic obstructive pulmonary disease) ([place])   . Diabetes mellitus without complication ([place])   . Hypertension   . Pneumonia     PAST SURGICAL HISTORY: Past Surgical History:  Procedure Laterality Date  .  ENDOBRONCHIAL ULTRASOUND N/A 05/12/2017   Procedure: ENDOBRONCHIAL ULTRASOUND;  Surgeon: Laverle Hobby, MD;  Location: [facility] ORS;  Service: Pulmonary;  Laterality: N/A;  . FRACTURE SURGERY Left    ANKLE X 2  . TONSILLECTOMY      FAMILY HISTORY: Family History  Problem Relation Age of Onset  . Stroke Mother   . Diabetes Mother   . Hypertension Mother   . Diabetes Father   . Hypertension Father   . Diabetes Sister   . Diabetes Brother   . Heart attack Paternal Uncle   . Stroke Maternal Grandmother     ADVANCED DIRECTIVES (Y/N):  N  HEALTH MAINTENANCE: Social History   Tobacco Use  . Smoking status: Current Every Day Smoker    Packs/day: 1.50  . Smokeless tobacco: Never Used  Substance Use Topics  . Alcohol use: No  . Drug use: Not on file     Colonoscopy:  PAP:  Bone density:  Lipid panel:  Allergies  Allergen Reactions  . Shrimp [Shellfish Allergy] Nausea And Vomiting    Current Outpatient Medications  Medication Sig Dispense Refill  . albuterol (PROVENTIL HFA;VENTOLIN HFA) 108 (90 BASE) MCG/ACT inhaler Inhale 2 puffs into the lungs every 6 (six) hours as needed for wheezing or shortness of breath.    Marland Kitchen albuterol (PROVENTIL) (2.5 MG/3ML) 0.083% nebulizer solution Take 2.5 mg every 6 (six) hours as needed by nebulization for wheezing or shortness of breath.    . beclomethasone (QVAR) 80 MCG/ACT inhaler Inhale 1 puff into the lungs 2 (two) times daily.    Marland Kitchen glipiZIDE (GLUCOTROL) 5 MG tablet Take 5 mg by mouth daily before breakfast.    .  ibuprofen (ADVIL,MOTRIN) 200 MG tablet Take 400-800 mg every 8 (eight) hours as needed by mouth (for pain.).    Marland Kitchen lisinopril-hydrochlorothiazide (PRINZIDE,ZESTORETIC) 20-25 MG tablet Take 1 tablet by mouth daily.    . metFORMIN (GLUCOPHAGE) 1000 MG tablet Take 1,000 mg by mouth 2 (two) times daily.    Marland Kitchen sulfamethoxazole-trimethoprim (BACTRIM) 400-80 MG tablet Take 1 tablet by mouth 2 (two) times daily for 10 days. 20 tablet 0    No current facility-administered medications for this visit.     OBJECTIVE: Vitals:   05/16/17 0922  BP: (!) 149/98  Pulse: 80  Resp: 20  Temp: 97.9 F (36.6 C)     Body mass index is 32.08 kg/m.    ECOG FS:0 - Asymptomatic  General: Well-developed, well-nourished, no acute distress. Eyes: Pink conjunctiva, anicteric sclera. HEENT: Normocephalic, moist mucous membranes, clear oropharnyx. Lungs: Clear to auscultation bilaterally. Heart: Regular rate and rhythm. No rubs, murmurs, or gallops. Abdomen: Soft, nontender, nondistended. No organomegaly noted, normoactive bowel sounds. Musculoskeletal: No edema, cyanosis, or clubbing. Neuro: Alert, answering all questions appropriately. Cranial nerves grossly intact. Skin: No rashes or petechiae noted. Psych: Normal affect. Lymphatics: No cervical, calvicular, axillary or inguinal LAD.   LAB RESULTS:  Lab Results  Component Value Date   NA 134 (L) 05/12/2017   K 3.9 05/12/2017   CL 97 (L) 05/12/2017   CO2 26 05/12/2017   GLUCOSE 201 (H) 05/12/2017   BUN 16 05/12/2017   CREATININE 0.70 05/12/2017   CALCIUM 9.2 05/12/2017   GFRNONAA >60 05/12/2017   GFRAA >60 05/12/2017    Lab Results  Component Value Date   WBC 14.9 (H) 05/12/2017   HGB 14.2 05/12/2017   HCT 43.0 05/12/2017   MCV 86.0 05/12/2017   PLT 416 05/12/2017     STUDIES: Dg Chest 1 View  Result Date: 05/12/2017 CLINICAL DATA:  Lung biopsy. EXAM: CHEST 1 VIEW COMPARISON:  04/28/2017 FINDINGS: Heart size is normal. There is persistent dense opacity within the right upper lobe. Following lung biopsy, it no pneumothorax is identified. No pulmonary edema. Left lung is clear. IMPRESSION: Persistent right upper lobe opacity.  No pneumothorax. Electronically Signed   By: Nolon Nations M.D.   On: 05/12/2017 15:19   Ct Chest W Contrast  Result Date: 04/28/2017 CLINICAL DATA:  Pneumonia, shortness of breath. EXAM: CT CHEST WITH CONTRAST TECHNIQUE: Multidetector  CT imaging of the chest was performed during intravenous contrast administration. CONTRAST:  44mL ISOVUE-300 IOPAMIDOL (ISOVUE-300) INJECTION 61% COMPARISON:  None. FINDINGS: Cardiovascular: Heart is normal size. Aorta is normal caliber. Scattered coronary artery and aortic calcifications. Mediastinum/Nodes: Numerous borderline sized and mildly enlarged mediastinal lymph nodes. Right paratracheal lymph node has a short axis diameter of 12 mm on image 61. Pre carina lymph node has a short axis diameter of 12 mm on image 70. No visible axillary or hilar adenopathy. Lungs/Pleura: Airspace disease noted in the anterior right upper lobe extending from the hilum to the anterolateral pleural surface. There is circumferential wall thickening noted in the right mainstem bronchus with possible bronchial occlusion in the upper lobe bronchus. Findings concerning for central obstructing endobronchial process/malignancy and postobstructive process. No effusions. Upper Abdomen: Right adrenal nodule measures 2.4 cm with a density of 18 Hounsfield units, nonspecific. Mild diffuse thickening of the right adrenal gland. Musculoskeletal: Chest wall soft tissues are unremarkable. No acute bony abnormality. IMPRESSION: Airspace disease/ consolidation in the right upper lobe with associated bronchial wall thickening in the right mainstem bronchus and upper airways with  possible airway occlusion/obstruction. Findings concerning for central obstructing process, possible malignancy. This could be further evaluated with bronchoscopy or followed with repeat CT after treatment for pneumonia. Borderline sized and mildly enlarged mediastinal lymph nodes. Nonspecific 2.4 cm left adrenal nodule. This could be further evaluated with noncontrast abdominal CT. Coronary artery disease, aortic atherosclerosis. Electronically Signed   By: Rolm Baptise M.D.   On: 04/28/2017 08:24    ASSESSMENT: Stage IIIb squamous cell carcinoma of the left  lung.  PLAN:    1. 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  Patient was also discussed at cancer conference earlier this week.  He will require a PET scan as well as MRI of the brain to complete the staging workup.  Patient was also given a referral to radiation oncology consideration of concurrent chemotherapy along with XRT.  The stage of his disease, he will also benefit from maintenance immunotherapy at the conclusion of his chemotherapy and XRT.  Return to clinic in approximately a week for further evaluation and treatment planning.  Approximately 60 minutes was spent in discussion of which greater than 50% was consultation.  Patient expressed understanding and was in agreement with this plan. He also understands that He can call clinic at any time with any questions, concerns, or complaints.   Cancer Staging Squamous cell lung cancer, left [facility]) Staging form: Lung, AJCC 8th Edition - Clinical stage from 05/16/2017: Stage IIIB (cT3, cN2, cM0) - Signed by Lloyd Huger, MD on 05/16/2017   Lloyd Huger, MD   05/18/2017 8:42 AM

## 2017-05-17 DIAGNOSIS — C3490 Malignant neoplasm of unspecified part of unspecified bronchus or lung: Secondary | ICD-10-CM

## 2017-05-17 HISTORY — DX: Malignant neoplasm of unspecified part of unspecified bronchus or lung: C34.90

## 2017-05-22 ENCOUNTER — Ambulatory Visit: Payer: BLUE CROSS/BLUE SHIELD

## 2017-05-23 ENCOUNTER — Ambulatory Visit
Admission: RE | Admit: 2017-05-23 | Discharge: 2017-05-23 | Disposition: A | Discharge status: Home / Self Care | Payer: BLUE CROSS/BLUE SHIELD | Source: Ambulatory Visit | Attending: Oncology | Admitting: Oncology

## 2017-05-23 ENCOUNTER — Encounter: Payer: Self-pay | Admitting: Radiation Oncology

## 2017-05-23 ENCOUNTER — Other Ambulatory Visit: Payer: Self-pay

## 2017-05-23 ENCOUNTER — Encounter: Payer: Self-pay | Admitting: *Deleted

## 2017-05-23 ENCOUNTER — Ambulatory Visit
Admission: RE | Admit: 2017-05-23 | Discharge: 2017-05-23 | Disposition: A | Discharge status: Home / Self Care | Payer: BLUE CROSS/BLUE SHIELD | Source: Ambulatory Visit | Attending: Radiation Oncology | Admitting: Radiation Oncology

## 2017-05-23 VITALS — BP 163/93 | HR 92 | Temp 97.8°F | Resp 20 | Wt 210.2 lb

## 2017-05-23 DIAGNOSIS — E119 Type 2 diabetes mellitus without complications: Secondary | ICD-10-CM | POA: Insufficient documentation

## 2017-05-23 DIAGNOSIS — Z7189 Other specified counseling: Secondary | ICD-10-CM | POA: Insufficient documentation

## 2017-05-23 DIAGNOSIS — C3492 Malignant neoplasm of unspecified part of left bronchus or lung: Secondary | ICD-10-CM | POA: Diagnosis present

## 2017-05-23 DIAGNOSIS — C3491 Malignant neoplasm of unspecified part of right bronchus or lung: Secondary | ICD-10-CM | POA: Diagnosis not present

## 2017-05-23 DIAGNOSIS — R9389 Abnormal findings on diagnostic imaging of other specified body structures: Secondary | ICD-10-CM | POA: Insufficient documentation

## 2017-05-23 DIAGNOSIS — Z8701 Personal history of pneumonia (recurrent): Secondary | ICD-10-CM | POA: Diagnosis not present

## 2017-05-23 DIAGNOSIS — Z79899 Other long term (current) drug therapy: Secondary | ICD-10-CM | POA: Insufficient documentation

## 2017-05-23 DIAGNOSIS — I10 Essential (primary) hypertension: Secondary | ICD-10-CM | POA: Diagnosis not present

## 2017-05-23 DIAGNOSIS — F17210 Nicotine dependence, cigarettes, uncomplicated: Secondary | ICD-10-CM | POA: Insufficient documentation

## 2017-05-23 DIAGNOSIS — Z510 Encounter for antineoplastic radiation therapy: Secondary | ICD-10-CM | POA: Insufficient documentation

## 2017-05-23 DIAGNOSIS — Z7984 Long term (current) use of oral hypoglycemic drugs: Secondary | ICD-10-CM | POA: Diagnosis not present

## 2017-05-23 DIAGNOSIS — J449 Chronic obstructive pulmonary disease, unspecified: Secondary | ICD-10-CM | POA: Insufficient documentation

## 2017-05-23 MED ORDER — GADOBENATE DIMEGLUMINE 529 MG/ML IV SOLN
20.0000 mL | Freq: Once | INTRAVENOUS | Status: AC | PRN
  Administered 2017-05-23: 19 mL via INTRAVENOUS

## 2017-05-23 NOTE — Progress Notes (Signed)
  Oncology Nurse Navigator Documentation  Navigator Location: [facility] (05/23/17 1500)   )Navigator Encounter Type: Initial RadOnc (05/23/17 1500)                       Treatment Phase: Pre-Tx/Tx Discussion (05/23/17 1500) Barriers/Navigation Needs: Coordination of Care (05/23/17 1500)   Interventions: Coordination of Care (05/23/17 1500)   Coordination of Care: Appts;Radiology (05/23/17 1500)         met with patient during initial rad-onc consultation with Dr. Baruch Gouty. Pt present with 2 daughters and his father. All questions answered at the time of visit. Upcoming appts reviewed with the patient and family. No further questions or needs. Informed pt that will follow up with him next week after his PET scan. Pt verbalized understanding.         Time Spent with Patient: 60 (05/23/17 1500)

## 2017-05-23 NOTE — Consult Note (Signed)
NEW PATIENT EVALUATION  Name: Gabriel Mendez  MRN: [id number]  Date:   05/23/2017     DOB: 1960/09/06   This 56 y.o. male patient presents to the clinic for initial evaluation of stage IIIB squamous cell carcinoma of the left lung.  REFERRING PHYSICIAN: Othelia Pulling Justain, P*  CHIEF COMPLAINT:  Chief Complaint  Patient presents with  . Cancer    Pt is here for initial consultation of lung cancer    DIAGNOSIS: The encounter diagnosis was Squamous cell lung cancer, left ([place]).   PREVIOUS INVESTIGATIONS:  CT scan PET CT scans reviewed Pathology reports reviewed Clinical notes reviewed  HPI: Patient is a 56 year old male who presented to his PMD with increasing persistent cough initially tried on empiric antibiotic therapy. He eventually had a CT scan of the chest showing consolidation of the right upper lobe concerning for central obstructing mass possibly malignancy. Patient underwent bronchoscopy with findings of squamous cell carcinoma of the left lung. Workup including MRI of the brain showed no evidence of metastatic disease. He is scheduled for early next week to have a PET CT scan performed. He is seen today and is doing fairly well still has persistent cough no hemoptysis. He has been seen by medical oncology and is now referred to radiation oncology for opinion. He's having no pain at this time and by mouth intake is good.  PLANNED TREATMENT REGIMEN: Concurrent chemoradiation with curative intent  PAST MEDICAL HISTORY:  has a past medical history of COPD (chronic obstructive pulmonary disease) ([place]), Diabetes mellitus without complication ([place]), Hypertension, and Pneumonia.    PAST SURGICAL HISTORY:  Past Surgical History:  Procedure Laterality Date  . ENDOBRONCHIAL ULTRASOUND N/A 05/12/2017   Procedure: ENDOBRONCHIAL ULTRASOUND;  Surgeon: Laverle Hobby, MD;  Location: [facility] ORS;  Service: Pulmonary;  Laterality: N/A;  . FRACTURE SURGERY Left    ANKLE X 2  .  TONSILLECTOMY      FAMILY HISTORY: family history includes Diabetes in his brother, father, mother, and sister; Heart attack in his paternal uncle; Hypertension in his father and mother; Stroke in his maternal grandmother and mother.  SOCIAL HISTORY:  reports that he has been smoking.  He has been smoking about 1.50 packs per day. he has never used smokeless tobacco. He reports that he does not drink alcohol.  ALLERGIES: Shrimp [shellfish allergy]  MEDICATIONS:  Current Outpatient Medications  Medication Sig Dispense Refill  . albuterol (PROVENTIL HFA;VENTOLIN HFA) 108 (90 BASE) MCG/ACT inhaler Inhale 2 puffs into the lungs every 6 (six) hours as needed for wheezing or shortness of breath.    Marland Kitchen albuterol (PROVENTIL) (2.5 MG/3ML) 0.083% nebulizer solution Take 2.5 mg every 6 (six) hours as needed by nebulization for wheezing or shortness of breath.    . beclomethasone (QVAR) 80 MCG/ACT inhaler Inhale 1 puff into the lungs 2 (two) times daily.    Marland Kitchen glipiZIDE (GLUCOTROL) 5 MG tablet Take 5 mg by mouth daily before breakfast.    . ibuprofen (ADVIL,MOTRIN) 200 MG tablet Take 400-800 mg every 8 (eight) hours as needed by mouth (for pain.).    Marland Kitchen lisinopril-hydrochlorothiazide (PRINZIDE,ZESTORETIC) 20-25 MG tablet Take 1 tablet by mouth daily.    . metFORMIN (GLUCOPHAGE) 1000 MG tablet Take 1,000 mg by mouth 2 (two) times daily.    Marland Kitchen sulfamethoxazole-trimethoprim (BACTRIM) 400-80 MG tablet Take 1 tablet by mouth 2 (two) times daily for 10 days. 20 tablet 0   No current facility-administered medications for this encounter.     ECOG PERFORMANCE  STATUS:  1 - Symptomatic but completely ambulatory  REVIEW OF SYSTEMS:  Patient denies any weight loss, fatigue, weakness, fever, chills or night sweats. Patient denies any loss of vision, blurred vision. Patient denies any ringing  of the ears or hearing loss. No irregular heartbeat. Patient denies heart murmur or history of fainting. Patient denies any  chest pain or pain radiating to her upper extremities. Patient denies any shortness of breath, difficulty breathing at night, cough or hemoptysis. Patient denies any swelling in the lower legs. Patient denies any nausea vomiting, vomiting of blood, or coffee ground material in the vomitus. Patient denies any stomach pain. Patient states has had normal bowel movements no significant constipation or diarrhea. Patient denies any dysuria, hematuria or significant nocturia. Patient denies any problems walking, swelling in the joints or loss of balance. Patient denies any skin changes, loss of hair or loss of weight. Patient denies any excessive worrying or anxiety or significant depression. Patient denies any problems with insomnia. Patient denies excessive thirst, polyuria, polydipsia. Patient denies any swollen glands, patient denies easy bruising or easy bleeding. Patient denies any recent infections, allergies or URI. Patient "s visual fields have not changed significantly in recent time.    PHYSICAL EXAM: BP (!) 163/93   Pulse 92   Temp 97.8 F (36.6 C)   Resp 20   Wt 210 lb 3.3 oz (95.4 kg)   BMI 31.96 kg/m  Well-developed well-nourished patient in NAD. HEENT reveals PERLA, EOMI, discs not visualized.  Oral cavity is clear. No oral mucosal lesions are identified. Neck is clear without evidence of cervical or supraclavicular adenopathy. Lungs are clear to A&P. Cardiac examination is essentially unremarkable with regular rate and rhythm without murmur rub or thrill. Abdomen is benign with no organomegaly or masses noted. Motor sensory and DTR levels are equal and symmetric in the upper and lower extremities. Cranial nerves II through XII are grossly intact. Proprioception is intact. No peripheral adenopathy or edema is identified. No motor or sensory levels are noted. Crude visual fields are within normal range.  LABORATORY DATA: Cytology and pathology reports reviewed    RADIOLOGY RESULTS: CT scan  of chest and MRI scan of brain reviewed PET CT scan to be reviewed when available   IMPRESSION: Stage IIIB squamous cell carcinoma of left lung in 57 year old male  PLAN: At this time will review his PET/CT scan for complete staging when available. At this time I would stage this is a IIIB squamous cell carcinoma the left lung. I would recommend concurrent chemoradiation. I would plan on delivering 6600 cGy using I MRT radiation therapy to his left lung. Would use PET CT fusion study for treatment planning. I would choose I MRT to void critical structures such as his left ventricle normal lung volume spinal cord and esophagus. Risks and benefits of treatment including possible radiation esophagitis fatigue skin reaction alteration of blood counts all were discussed in detail with the patient and his family. They all seem to comprehend my treatment plan well.There will be extra effort by both professional staff as well as technical staff to coordinate and manage concurrent chemoradiation and ensuing side effects during his treatments. I have personally set up and ordered CT simulation for next week after I have reviewed his PET/CT scan. We'll coordinate his chemotherapy with medical oncology. Patient and family seem to comprehend my treatment plan well.  I would like to take this opportunity to thank you for allowing me to participate in the care of  your patient.Armstead Peaks., MD

## 2017-05-26 ENCOUNTER — Ambulatory Visit: Payer: BLUE CROSS/BLUE SHIELD

## 2017-05-27 ENCOUNTER — Encounter
Admission: RE | Admit: 2017-05-27 | Discharge: 2017-05-27 | Disposition: A | Discharge status: Home / Self Care | Payer: BLUE CROSS/BLUE SHIELD | Source: Ambulatory Visit | Attending: Oncology | Admitting: Oncology

## 2017-05-27 ENCOUNTER — Ambulatory Visit: Payer: BLUE CROSS/BLUE SHIELD | Admitting: Oncology

## 2017-05-27 DIAGNOSIS — C3491 Malignant neoplasm of unspecified part of right bronchus or lung: Secondary | ICD-10-CM | POA: Diagnosis present

## 2017-05-27 DIAGNOSIS — Z7189 Other specified counseling: Secondary | ICD-10-CM | POA: Insufficient documentation

## 2017-05-27 LAB — GLUCOSE, CAPILLARY: GLUCOSE-CAPILLARY: 148 mg/dL — AB (ref 65–99)

## 2017-05-27 MED ORDER — FLUDEOXYGLUCOSE F - 18 (FDG) INJECTION
12.0000 | Freq: Once | INTRAVENOUS | Status: AC | PRN
  Administered 2017-05-27: 13.1 via INTRAVENOUS

## 2017-05-27 NOTE — Progress Notes (Signed)
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  INTERVAL HISTORY: Patient returns to clinic today for further evaluation, discussion of his imaging results, and treatment planning.  He currently feels well and is asymptomatic. He has no neurologic complaints.  He denies any recent fevers.  He has a good appetite and denies weight loss.  He denies any chest pain, cough, shortness of breath, or hemoptysis.  He has no nausea, vomiting, constipation, or diarrhea.  He has no urinary complaints.  Patient offers no specific complaints today.  REVIEW OF SYSTEMS:   Review of Systems  Constitutional: Negative.  Negative for fever, malaise/fatigue and weight loss.  Respiratory: Negative.  Negative for cough, hemoptysis and shortness of breath.   Cardiovascular: Negative.  Negative for chest pain and leg swelling.  Gastrointestinal: Negative.  Negative for abdominal pain.  Genitourinary: Negative.   Musculoskeletal: Negative.   Skin: Negative.  Negative for rash.  Neurological: Negative.  Negative for sensory change and weakness.  Psychiatric/Behavioral: Negative.  The patient is not nervous/anxious.     As per HPI. Otherwise, a complete review of systems is negative.  PAST MEDICAL HISTORY: Past Medical History:  Diagnosis Date  . COPD (chronic obstructive pulmonary disease) ([place])   . Diabetes mellitus without complication ([place])   . Hypertension   . Pneumonia     PAST SURGICAL HISTORY: Past Surgical History:  Procedure Laterality Date  . ENDOBRONCHIAL ULTRASOUND N/A 05/12/2017   Procedure: ENDOBRONCHIAL ULTRASOUND;  Surgeon: Laverle Hobby, MD;  Location: [facility] ORS;  Service: Pulmonary;   Laterality: N/A;  . FRACTURE SURGERY Left    ANKLE X 2  . TONSILLECTOMY      FAMILY HISTORY: Family History  Problem Relation Age of Onset  . Stroke Mother   . Diabetes Mother   . Hypertension Mother   . Diabetes Father   . Hypertension Father   . Diabetes Sister   . Diabetes Brother   . Heart attack Paternal Uncle   . Stroke Maternal Grandmother     ADVANCED DIRECTIVES (Y/N):  N  HEALTH MAINTENANCE: Social History   Tobacco Use  . Smoking status: Current Every Day Smoker    Packs/day: 1.50  . Smokeless tobacco: Never Used  Substance Use Topics  . Alcohol use: No  . Drug use: Not on file     Colonoscopy:  PAP:  Bone density:  Lipid panel:  Allergies  Allergen Reactions  . Shrimp [Shellfish Allergy] Nausea And Vomiting    Current Outpatient Medications  Medication Sig Dispense Refill  . albuterol (PROVENTIL HFA;VENTOLIN HFA) 108 (90 BASE) MCG/ACT inhaler Inhale 2 puffs into the lungs every 6 (six) hours as needed for wheezing or shortness of breath.    Marland Kitchen albuterol (PROVENTIL) (2.5 MG/3ML) 0.083% nebulizer solution Take 2.5 mg every 6 (six) hours as needed by nebulization for wheezing or shortness of breath.    . beclomethasone (QVAR) 80 MCG/ACT inhaler Inhale 1 puff into the lungs 2 (two) times daily.    Marland Kitchen glipiZIDE (GLUCOTROL) 5 MG tablet Take 5 mg by mouth daily before breakfast.    . ibuprofen (ADVIL,MOTRIN) 200 MG tablet Take 400-800 mg every 8 (eight) hours as needed by mouth (for pain.).    Marland Kitchen lisinopril-hydrochlorothiazide (PRINZIDE,ZESTORETIC) 20-25 MG  tablet Take 1 tablet by mouth daily.    . metFORMIN (GLUCOPHAGE) 1000 MG tablet Take 1,000 mg by mouth 2 (two) times daily.     No current facility-administered medications for this visit.     OBJECTIVE: Vitals:   05/28/17 0932  BP: (!) 178/97  Pulse: (!) 111  Resp: 18  Temp: 97.7 F (36.5 C)     Body mass index is 32.46 kg/m.    ECOG FS:0 - Asymptomatic  General: Well-developed, well-nourished,  no acute distress. Eyes: Pink conjunctiva, anicteric sclera. Lungs: Clear to auscultation bilaterally. Heart: Regular rate and rhythm. No rubs, murmurs, or gallops. Abdomen: Soft, nontender, nondistended. No organomegaly noted, normoactive bowel sounds. Musculoskeletal: No edema, cyanosis, or clubbing. Neuro: Alert, answering all questions appropriately. Cranial nerves grossly intact. Skin: No rashes or petechiae noted. Psych: Normal affect.  LAB RESULTS:  Lab Results  Component Value Date   NA 134 (L) 05/12/2017   K 3.9 05/12/2017   CL 97 (L) 05/12/2017   CO2 26 05/12/2017   GLUCOSE 201 (H) 05/12/2017   BUN 16 05/12/2017   CREATININE 0.70 05/12/2017   CALCIUM 9.2 05/12/2017   GFRNONAA >60 05/12/2017   GFRAA >60 05/12/2017    Lab Results  Component Value Date   WBC 14.9 (H) 05/12/2017   HGB 14.2 05/12/2017   HCT 43.0 05/12/2017   MCV 86.0 05/12/2017   PLT 416 05/12/2017     STUDIES: Dg Chest 1 View  Result Date: 05/12/2017 CLINICAL DATA:  Lung biopsy. EXAM: CHEST 1 VIEW COMPARISON:  04/28/2017 FINDINGS: Heart size is normal. There is persistent dense opacity within the right upper lobe. Following lung biopsy, it no pneumothorax is identified. No pulmonary edema. Left lung is clear. IMPRESSION: Persistent right upper lobe opacity.  No pneumothorax. Electronically Signed   By: Nolon Nations M.D.   On: 05/12/2017 15:19   Mr [name] SN Contrast  Result Date: 05/23/2017 CLINICAL DATA:  Recent diagnosis of lung cancer.  Staging. EXAM: MRI HEAD WITHOUT AND WITH CONTRAST TECHNIQUE: Multiplanar, multiecho pulse sequences of the brain and surrounding structures were obtained without and with intravenous contrast. CONTRAST:  78mL MULTIHANCE GADOBENATE DIMEGLUMINE 529 MG/ML IV SOLN COMPARISON:  None. FINDINGS: Brain: No acute infarction, hemorrhage, hydrocephalus, extra-axial collection or mass lesion. Mild chronic microvascular ischemic type change in the cerebral white matter.  Remote lacunar infarct in the left corona radiata. Vascular: Major flow voids and vascular enhancements are preserved. Tiny left frontal developmental venous anomaly that is incidental Skull and upper cervical spine: Negative for marrow lesion. Sinuses/Orbits: Mucosal thickening in the paranasal sinuses. Nasal septal perforation without neighboring nodularity. Other: 18 mm left parotid nodule, larger than expected for normal node. IMPRESSION: 1. No evidence of intracranial metastasis. 2. 18 mm left parotid nodule that is nonspecific, attention on follow-up imaging/PET. Electronically Signed   By: Monte Fantasia M.D.   On: 05/23/2017 09:30   Nm Pet Image Initial (pi) Skull Base To Thigh  Result Date: 05/27/2017 CLINICAL DATA:  Initial treatment strategy for RIGHT upper lobe consolidation. RIGHT upper lobe squamous cell carcinoma. EXAM: NUCLEAR MEDICINE PET SKULL BASE TO THIGH TECHNIQUE: 13.1 mCi F-18 FDG was injected intravenously. Full-ring PET imaging was performed from the skull base to thigh after the radiotracer. CT data was obtained and used for attenuation correction and anatomic localization. FASTING BLOOD GLUCOSE:  Value: 148 mg/dl COMPARISON:  Chest CT 04/28/2017 FINDINGS: NECK Hypermetabolic nodule within the posterior aspect of the LEFT parotid gland SUV max equals 6.7. This  nodule is mildly hyper dense on noncontrast CT measuring 13 mm (image 27, series 3). No hypermetabolic lymph nodes in the neck. CHEST Hypermetabolic RIGHT hilar mass difficult to measure on this noncontrast CT but the metabolic portion measures approximately 2.8 cm. This lesion is intensely metabolic with SUV max equal 11.2. There is partial postobstructive collapse in the RIGHT upper lobe with mild metabolic activity. There is a hypermetabolic subcarinal and RIGHT lower paratracheal lymph node. RIGHT lower paratracheal lymph node with SUV max equals 6.2. There no contralateral hypermetabolic nodes. Nodes hypermetabolic  supraclavicular nodes. No additional hypermetabolic pulmonary nodules per ABDOMEN/PELVIS Benign adrenal adenoma of the LEFT adrenal gland. No abnormal metabolic activity liver. Pancreas, spleen, kidneys are normal. Intense uptake in the bowel associated with metformin. No hypermetabolic abdominopelvic lymph nodes. SKELETON No focal hypermetabolic activity to suggest skeletal metastasis. IMPRESSION: 1. Hypermetabolic RIGHT hilar mass with partial postobstructive collapse of the RIGHT upper lobe consists with bronchogenic carcinoma. 2. Subcarinal and ipsilateral nodal metastasis. 3. No evidence distant metastatic disease. 4. FDG PET scan staging T2b N2 M0 5. Hypermetabolic LEFT parotid nodule. 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 Consider ENT consultation. Electronically Signed   By: Suzy Bouchard M.D.   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  PLAN:    1. 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  Patient was also discussed at cancer conference last week.  MRI of the brain is negative.  PET scan results reviewed independently and reported as above.  Patient will benefit from concurrent chemotherapy using carboplatinum and Taxol weekly along with daily XRT.  Given the stage of his disease, he will also benefit from maintenance immunotherapy with Imfinzi at the conclusion of his chemotherapy and XRT.  Patient has declined port placement at this time, but will reconsider in the future.  Return to clinic on June 05, 2017 to initiate cycle 1 of weekly carboplatinum and Taxol.  He will initiate XRT on June 11, 2017.    Approximately 30 minutes was spent in discussion of which greater than 50% was consultation.  Patient expressed understanding and was in agreement with this plan. He also understands that He can call clinic at any  time with any questions, concerns, or complaints.   Cancer Staging Squamous cell lung cancer, left [facility]) Staging form: Lung, AJCC 8th Edition - Clinical stage from 05/16/2017: Stage IIIB (cT3, cN2, cM0) - Signed by Lloyd Huger, MD on 05/16/2017   Lloyd Huger, MD   05/28/2017 9:41 AM

## 2017-05-28 ENCOUNTER — Inpatient Hospital Stay: Payer: BLUE CROSS/BLUE SHIELD | Attending: Oncology | Admitting: Oncology

## 2017-05-28 ENCOUNTER — Encounter: Payer: Self-pay | Admitting: *Deleted

## 2017-05-28 ENCOUNTER — Ambulatory Visit
Admission: RE | Admit: 2017-05-28 | Discharge: 2017-05-28 | Disposition: A | Discharge status: Home / Self Care | Payer: BLUE CROSS/BLUE SHIELD | Source: Ambulatory Visit | Attending: Radiation Oncology | Admitting: Radiation Oncology

## 2017-05-28 ENCOUNTER — Other Ambulatory Visit: Payer: Self-pay

## 2017-05-28 ENCOUNTER — Encounter: Payer: Self-pay | Admitting: Oncology

## 2017-05-28 VITALS — BP 178/97 | HR 111 | Temp 97.7°F | Resp 18 | Wt 213.5 lb

## 2017-05-28 DIAGNOSIS — C3411 Malignant neoplasm of upper lobe, right bronchus or lung: Secondary | ICD-10-CM | POA: Diagnosis not present

## 2017-05-28 DIAGNOSIS — Z8781 Personal history of (healed) traumatic fracture: Secondary | ICD-10-CM | POA: Insufficient documentation

## 2017-05-28 DIAGNOSIS — E1165 Type 2 diabetes mellitus with hyperglycemia: Secondary | ICD-10-CM | POA: Insufficient documentation

## 2017-05-28 DIAGNOSIS — C3492 Malignant neoplasm of unspecified part of left bronchus or lung: Secondary | ICD-10-CM

## 2017-05-28 DIAGNOSIS — K30 Functional dyspepsia: Secondary | ICD-10-CM | POA: Insufficient documentation

## 2017-05-28 DIAGNOSIS — R141 Gas pain: Secondary | ICD-10-CM | POA: Diagnosis not present

## 2017-05-28 DIAGNOSIS — J3489 Other specified disorders of nose and nasal sinuses: Secondary | ICD-10-CM

## 2017-05-28 DIAGNOSIS — Z5111 Encounter for antineoplastic chemotherapy: Secondary | ICD-10-CM | POA: Insufficient documentation

## 2017-05-28 DIAGNOSIS — Z7984 Long term (current) use of oral hypoglycemic drugs: Secondary | ICD-10-CM | POA: Diagnosis not present

## 2017-05-28 DIAGNOSIS — E119 Type 2 diabetes mellitus without complications: Secondary | ICD-10-CM | POA: Diagnosis not present

## 2017-05-28 DIAGNOSIS — J449 Chronic obstructive pulmonary disease, unspecified: Secondary | ICD-10-CM | POA: Insufficient documentation

## 2017-05-28 DIAGNOSIS — I10 Essential (primary) hypertension: Secondary | ICD-10-CM | POA: Diagnosis not present

## 2017-05-28 DIAGNOSIS — M2550 Pain in unspecified joint: Secondary | ICD-10-CM | POA: Diagnosis not present

## 2017-05-28 DIAGNOSIS — F17210 Nicotine dependence, cigarettes, uncomplicated: Secondary | ICD-10-CM | POA: Insufficient documentation

## 2017-05-28 DIAGNOSIS — C3412 Malignant neoplasm of upper lobe, left bronchus or lung: Secondary | ICD-10-CM | POA: Insufficient documentation

## 2017-05-28 DIAGNOSIS — Z8673 Personal history of transient ischemic attack (TIA), and cerebral infarction without residual deficits: Secondary | ICD-10-CM | POA: Insufficient documentation

## 2017-05-28 DIAGNOSIS — Z79899 Other long term (current) drug therapy: Secondary | ICD-10-CM | POA: Diagnosis not present

## 2017-05-28 NOTE — Progress Notes (Signed)
NEW PATIENT EVALUATION  Name: Gabriel Mendez   MRN: [id number]        Date:   05/23/2017     DOB: March 02, 1961   This 56 y.o. male patient presents to the clinic for initial evaluation of stage IIIB squamous cell carcinoma of theright lung.  REFERRING PHYSICIAN: Othelia Pulling Justain, P*  CHIEF COMPLAINT:      Chief Complaint  Patient presents with  . Cancer    Pt is here for initial consultation of lung cancer    DIAGNOSIS: The encounter diagnosis was Squamous cell lung cancer, left ([place]).   PREVIOUS INVESTIGATIONS:  CT scan PET CT scans reviewed Pathology reports reviewed Clinical notes reviewed  HPI: Patient is a 56 year old male who presented to his PMD with increasing persistent cough initially tried on empiric antibiotic therapy. He eventually had a CT scan of the chest showing consolidation of the right upper lobe concerning for central obstructing mass possibly malignancy. Patient underwent bronchoscopy with findings of squamous cell carcinoma of the right lung. Workup including MRI of the brain showed no evidence of metastatic disease. He is scheduled for early next week to have a PET CT scan performed. He is seen today and is doing fairly well still has persistent cough no hemoptysis. He has been seen by medical oncology and is now referred to radiation oncology for opinion. He's having no pain at this time and by mouth intake is good.  PLANNED TREATMENT REGIMEN: Concurrent chemoradiation with curative intent  PAST MEDICAL HISTORY:  has a past medical history of COPD (chronic obstructive pulmonary disease) ([place]), Diabetes mellitus without complication ([place]), Hypertension, and Pneumonia.    PAST SURGICAL HISTORY:       Past Surgical History:  Procedure Laterality Date  . ENDOBRONCHIAL ULTRASOUND N/A 05/12/2017   Procedure: ENDOBRONCHIAL ULTRASOUND;  Surgeon: Laverle Hobby, MD;  Location: [facility] ORS;  Service: Pulmonary;  Laterality: N/A;  . FRACTURE  SURGERY Left    ANKLE X 2  . TONSILLECTOMY      FAMILY HISTORY: family history includes Diabetes in his brother, father, mother, and sister; Heart attack in his paternal uncle; Hypertension in his father and mother; Stroke in his maternal grandmother and mother.  SOCIAL HISTORY:  reports that he has been smoking.  He has been smoking about 1.50 packs per day. he has never used smokeless tobacco. He reports that he does not drink alcohol.  ALLERGIES: Shrimp [shellfish allergy]  MEDICATIONS:        Current Outpatient Medications  Medication Sig Dispense Refill  . albuterol (PROVENTIL HFA;VENTOLIN HFA) 108 (90 BASE) MCG/ACT inhaler Inhale 2 puffs into the lungs every 6 (six) hours as needed for wheezing or shortness of breath.    Marland Kitchen albuterol (PROVENTIL) (2.5 MG/3ML) 0.083% nebulizer solution Take 2.5 mg every 6 (six) hours as needed by nebulization for wheezing or shortness of breath.    . beclomethasone (QVAR) 80 MCG/ACT inhaler Inhale 1 puff into the lungs 2 (two) times daily.    Marland Kitchen glipiZIDE (GLUCOTROL) 5 MG tablet Take 5 mg by mouth daily before breakfast.    . ibuprofen (ADVIL,MOTRIN) 200 MG tablet Take 400-800 mg every 8 (eight) hours as needed by mouth (for pain.).    Marland Kitchen lisinopril-hydrochlorothiazide (PRINZIDE,ZESTORETIC) 20-25 MG tablet Take 1 tablet by mouth daily.    . metFORMIN (GLUCOPHAGE) 1000 MG tablet Take 1,000 mg by mouth 2 (two) times daily.    Marland Kitchen sulfamethoxazole-trimethoprim (BACTRIM) 400-80 MG tablet Take 1 tablet by mouth 2 (two) times daily  for 10 days. 20 tablet 0   No current facility-administered medications for this encounter.     ECOG PERFORMANCE STATUS:  1 - Symptomatic but completely ambulatory  REVIEW OF SYSTEMS:  Patient denies any weight loss, fatigue, weakness, fever, chills or night sweats. Patient denies any loss of vision, blurred vision. Patient denies any ringing  of the ears or hearing loss. No irregular heartbeat. Patient  denies heart murmur or history of fainting. Patient denies any chest pain or pain radiating to her upper extremities. Patient denies any shortness of breath, difficulty breathing at night, cough or hemoptysis. Patient denies any swelling in the lower legs. Patient denies any nausea vomiting, vomiting of blood, or coffee ground material in the vomitus. Patient denies any stomach pain. Patient states has had normal bowel movements no significant constipation or diarrhea. Patient denies any dysuria, hematuria or significant nocturia. Patient denies any problems walking, swelling in the joints or loss of balance. Patient denies any skin changes, loss of hair or loss of weight. Patient denies any excessive worrying or anxiety or significant depression. Patient denies any problems with insomnia. Patient denies excessive thirst, polyuria, polydipsia. Patient denies any swollen glands, patient denies easy bruising or easy bleeding. Patient denies any recent infections, allergies or URI. Patient "s visual fields have not changed significantly in recent time.    PHYSICAL EXAM: BP (!) 163/93   Pulse 92   Temp 97.8 F (36.6 C)   Resp 20   Wt 210 lb 3.3 oz (95.4 kg)   BMI 31.96 kg/m  Well-developed well-nourished patient in NAD. HEENT reveals PERLA, EOMI, discs not visualized.  Oral cavity is clear. No oral mucosal lesions are identified. Neck is clear without evidence of cervical or supraclavicular adenopathy. Lungs are clear to A&P. Cardiac examination is essentially unremarkable with regular rate and rhythm without murmur rub or thrill. Abdomen is benign with no organomegaly or masses noted. Motor sensory and DTR levels are equal and symmetric in the upper and lower extremities. Cranial nerves II through XII are grossly intact. Proprioception is intact. No peripheral adenopathy or edema is identified. No motor or sensory levels are noted. Crude visual fields are within normal range.  LABORATORY DATA: Cytology  and pathology reports reviewed    RADIOLOGY RESULTS: CT scan of chest and MRI scan of brain reviewed PET CT scan to be reviewed when available   IMPRESSION: Stage IIIB squamous cell carcinoma oright lung in 56 year old male  PLAN: At this time will review his PET/CT scan for complete staging when available. At this time I would stage this is a IIIB squamous cell carcinoma the right lung. I would recommend concurrent chemoradiation. I would plan on delivering 6600 cGy using I MRT radiation therapy to his left lung. Would use PET CT fusion study for treatment planning. I would choose I MRT to void critical structures such as his left ventricle normal lung volume spinal cord and esophagus. Risks and benefits of treatment including possible radiation esophagitis fatigue skin reaction alteration of blood counts all were discussed in detail with the patient and his family. They all seem to comprehend my treatment plan well.There will be extra effort by both professional staff as well as technical staff to coordinate and manage concurrent chemoradiation and ensuing side effects during his treatments. I have personally set up and ordered CT simulation for next week after I have reviewed his PET/CT scan. We'll coordinate his chemotherapy with medical oncology. Patient and family seem to comprehend my treatment plan well.  I would like to take this opportunity to thank you for allowing me to participate in the care of your patient.Armstead Peaks., MD                  Electronically signed by [name], MD at 05/23/2017 12:31 PM     CONSULT on 05/23/2017        Detailed Report

## 2017-05-28 NOTE — Progress Notes (Signed)
  Oncology Nurse Navigator Documentation  Navigator Location: [facility] (05/28/17 1100)   )Navigator Encounter Type: Diagnostic Results;Follow-up Appt (05/28/17 1100)                         Barriers/Navigation Needs: Coordination of Care (05/28/17 1100)   Interventions: Coordination of Care (05/28/17 1100)   Coordination of Care: Appts;Chemo (05/28/17 1100)     met with patient during follow up visit with Dr. Grayland Ormond to review results from recent PET scan and to discuss treatment planning. All questions answered at the time of visit. Reviewed upcoming appts with patient. Instructed pt to call with any further needs or questions. Nothing further needed at this time.             Time Spent with Patient: 30 (05/28/17 1100)

## 2017-05-28 NOTE — Progress Notes (Signed)
Patient here today for follow up and PET results.

## 2017-05-29 NOTE — Patient Instructions (Signed)

## 2017-05-30 ENCOUNTER — Other Ambulatory Visit: Payer: Self-pay

## 2017-05-30 DIAGNOSIS — C3492 Malignant neoplasm of unspecified part of left bronchus or lung: Secondary | ICD-10-CM

## 2017-05-30 MED ORDER — ONDANSETRON HCL 8 MG PO TABS: 8.0000 mg | ORAL_TABLET | Freq: Two times a day (BID) | ORAL | 2 refills | Status: DC | PRN

## 2017-05-30 MED ORDER — PROCHLORPERAZINE MALEATE 10 MG PO TABS: 10.0000 mg | ORAL_TABLET | Freq: Four times a day (QID) | ORAL | 2 refills | Status: DC | PRN

## 2017-05-30 NOTE — Progress Notes (Signed)
START ON PATHWAY REGIMEN - Non-Small Cell Lung     Administer weekly:     Paclitaxel      Carboplatin   **Always confirm dose/schedule in your pharmacy ordering system**    Patient Characteristics: Stage III - Unresectable, PS = 0, 1 AJCC T Category: T3 Current Disease Status: No Distant Mets or Local Recurrence AJCC N Category: N2 AJCC M Category: M0 AJCC 8 Stage Grouping: IIIB Performance Status: PS = 0, 1 Intent of Therapy: Curative Intent, Discussed with Patient

## 2017-06-02 LAB — CULTURE, FUNGUS WITHOUT SMEAR

## 2017-06-03 ENCOUNTER — Inpatient Hospital Stay: Payer: BLUE CROSS/BLUE SHIELD

## 2017-06-03 DIAGNOSIS — C3490 Malignant neoplasm of unspecified part of unspecified bronchus or lung: Secondary | ICD-10-CM

## 2017-06-03 MED ORDER — PNEUMOCOCCAL VAC POLYVALENT 25 MCG/0.5ML IJ INJ
0.5000 mL | INJECTION | Freq: Once | INTRAMUSCULAR | Status: DC
  Filled 2017-06-03: qty 0.5

## 2017-06-03 NOTE — Progress Notes (Signed)
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  INTERVAL HISTORY: Patient returns to clinic today for further evaluation and initiation of cycle 1 of weekly carboplatinum and Taxol.  He will initiate concurrent XRT next week. He currently feels well and is asymptomatic. He has no neurologic complaints.  He denies any recent fevers.  He has a good appetite and denies weight loss.  He denies any chest pain, cough, shortness of breath, or hemoptysis.  He has no nausea, vomiting, constipation, or diarrhea.  He has no urinary complaints.  Patient offers no specific complaints today.  REVIEW OF SYSTEMS:   Review of Systems  Constitutional: Negative.  Negative for fever, malaise/fatigue and weight loss.  Respiratory: Negative.  Negative for cough, hemoptysis and shortness of breath.   Cardiovascular: Negative.  Negative for chest pain and leg swelling.  Gastrointestinal: Negative.  Negative for abdominal pain.  Genitourinary: Negative.   Musculoskeletal: Negative.   Skin: Negative.  Negative for rash.  Neurological: Negative.  Negative for sensory change and weakness.  Psychiatric/Behavioral: Negative.  The patient is not nervous/anxious.     As per HPI. Otherwise, a complete review of systems is negative.  PAST MEDICAL HISTORY: Past Medical History:  Diagnosis Date  . COPD (chronic obstructive pulmonary disease) ([place])   . Diabetes mellitus without complication ([place])   . Hypertension   . Pneumonia     PAST SURGICAL HISTORY: Past Surgical History:  Procedure Laterality Date  . ENDOBRONCHIAL ULTRASOUND N/A 05/12/2017   Procedure: ENDOBRONCHIAL ULTRASOUND;  Surgeon: Laverle Hobby, MD;   Location: [facility] ORS;  Service: Pulmonary;  Laterality: N/A;  . FRACTURE SURGERY Left    ANKLE X 2  . TONSILLECTOMY      FAMILY HISTORY: Family History  Problem Relation Age of Onset  . Stroke Mother   . Diabetes Mother   . Hypertension Mother   . Diabetes Father   . Hypertension Father   . Diabetes Sister   . Diabetes Brother   . Heart attack Paternal Uncle   . Stroke Maternal Grandmother     ADVANCED DIRECTIVES (Y/N):  N  HEALTH MAINTENANCE: Social History   Tobacco Use  . Smoking status: Current Every Day Smoker    Packs/day: 1.50  . Smokeless tobacco: Never Used  Substance Use Topics  . Alcohol use: No  . Drug use: Not on file     Colonoscopy:  PAP:  Bone density:  Lipid panel:  Allergies  Allergen Reactions  . Shrimp [Shellfish Allergy] Nausea And Vomiting    Current Outpatient Medications  Medication Sig Dispense Refill  . albuterol (PROVENTIL HFA;VENTOLIN HFA) 108 (90 BASE) MCG/ACT inhaler Inhale 2 puffs into the lungs every 6 (six) hours as needed for wheezing or shortness of breath.    Marland Kitchen albuterol (PROVENTIL) (2.5 MG/3ML) 0.083% nebulizer solution Take 2.5 mg every 6 (six) hours as needed by nebulization for wheezing or shortness of breath.    . beclomethasone (QVAR) 80 MCG/ACT inhaler Inhale 1 puff into the lungs 2 (two) times daily.    Marland Kitchen glipiZIDE (GLUCOTROL) 5 MG tablet Take 5 mg by mouth daily before breakfast.    . lisinopril-hydrochlorothiazide (PRINZIDE,ZESTORETIC) 20-25 MG tablet Take 1 tablet by mouth daily.    . metFORMIN (GLUCOPHAGE)  1000 MG tablet Take 1,000 mg by mouth 2 (two) times daily.    . ondansetron (ZOFRAN) 8 MG tablet Take 1 tablet (8 mg total) by mouth 2 (two) times daily as needed for refractory nausea / vomiting. 30 tablet 2  . prochlorperazine (COMPAZINE) 10 MG tablet Take 1 tablet (10 mg total) by mouth every 6 (six) hours as needed (Nausea or vomiting). 60 tablet 2  . ibuprofen (ADVIL,MOTRIN) 200 MG tablet Take 400-800 mg every  8 (eight) hours as needed by mouth (for pain.).     No current facility-administered medications for this visit.     OBJECTIVE: Vitals:   06/05/17 0841  BP: 124/81  Pulse: 87  Resp: 18  Temp: (!) 97.4 F (36.3 C)     Body mass index is 32.02 kg/m.    ECOG FS:0 - Asymptomatic  General: Well-developed, well-nourished, no acute distress. Eyes: Pink conjunctiva, anicteric sclera. Lungs: Clear to auscultation bilaterally. Heart: Regular rate and rhythm. No rubs, murmurs, or gallops. Abdomen: Soft, nontender, nondistended. No organomegaly noted, normoactive bowel sounds. Musculoskeletal: No edema, cyanosis, or clubbing. Neuro: Alert, answering all questions appropriately. Cranial nerves grossly intact. Skin: No rashes or petechiae noted. Psych: Normal affect.  LAB RESULTS:  Lab Results  Component Value Date   NA 134 (L) 06/05/2017   K 3.8 06/05/2017   CL 96 (L) 06/05/2017   CO2 28 06/05/2017   GLUCOSE 209 (H) 06/05/2017   BUN 15 06/05/2017   CREATININE 0.79 06/05/2017   CALCIUM 9.2 06/05/2017   PROT 7.3 06/05/2017   ALBUMIN 3.9 06/05/2017   AST 16 06/05/2017   ALT 14 (L) 06/05/2017   ALKPHOS 97 06/05/2017   BILITOT 0.5 06/05/2017   GFRNONAA >60 06/05/2017   GFRAA >60 06/05/2017    Lab Results  Component Value Date   WBC 11.7 (H) 06/05/2017   NEUTROABS 8.4 (H) 06/05/2017   HGB 14.6 06/05/2017   HCT 43.8 06/05/2017   MCV 86.8 06/05/2017   PLT 343 06/05/2017     STUDIES: Dg Chest 1 View  Result Date: 05/12/2017 CLINICAL DATA:  Lung biopsy. EXAM: CHEST 1 VIEW COMPARISON:  04/28/2017 FINDINGS: Heart size is normal. There is persistent dense opacity within the right upper lobe. Following lung biopsy, it no pneumothorax is identified. No pulmonary edema. Left lung is clear. IMPRESSION: Persistent right upper lobe opacity.  No pneumothorax. Electronically Signed   By: Nolon Nations M.D.   On: 05/12/2017 15:19   Mr [name] AC Contrast  Result Date:  05/23/2017 CLINICAL DATA:  Recent diagnosis of lung cancer.  Staging. EXAM: MRI HEAD WITHOUT AND WITH CONTRAST TECHNIQUE: Multiplanar, multiecho pulse sequences of the brain and surrounding structures were obtained without and with intravenous contrast. CONTRAST:  27mL MULTIHANCE GADOBENATE DIMEGLUMINE 529 MG/ML IV SOLN COMPARISON:  None. FINDINGS: Brain: No acute infarction, hemorrhage, hydrocephalus, extra-axial collection or mass lesion. Mild chronic microvascular ischemic type change in the cerebral white matter. Remote lacunar infarct in the left corona radiata. Vascular: Major flow voids and vascular enhancements are preserved. Tiny left frontal developmental venous anomaly that is incidental Skull and upper cervical spine: Negative for marrow lesion. Sinuses/Orbits: Mucosal thickening in the paranasal sinuses. Nasal septal perforation without neighboring nodularity. Other: 18 mm left parotid nodule, larger than expected for normal node. IMPRESSION: 1. No evidence of intracranial metastasis. 2. 18 mm left parotid nodule that is nonspecific, attention on follow-up imaging/PET. Electronically Signed   By: Monte Fantasia M.D.   On: 05/23/2017 09:30   Nm  Pet Image Initial (pi) Skull Base To Thigh  Result Date: 05/27/2017 CLINICAL DATA:  Initial treatment strategy for RIGHT upper lobe consolidation. RIGHT upper lobe squamous cell carcinoma. EXAM: NUCLEAR MEDICINE PET SKULL BASE TO THIGH TECHNIQUE: 13.1 mCi F-18 FDG was injected intravenously. Full-ring PET imaging was performed from the skull base to thigh after the radiotracer. CT data was obtained and used for attenuation correction and anatomic localization. FASTING BLOOD GLUCOSE:  Value: 148 mg/dl COMPARISON:  Chest CT 04/28/2017 FINDINGS: NECK Hypermetabolic nodule within the posterior aspect of the LEFT parotid gland SUV max equals 6.7. This nodule is mildly hyper dense on noncontrast CT measuring 13 mm (image 27, series 3). No hypermetabolic lymph  nodes in the neck. CHEST Hypermetabolic RIGHT hilar mass difficult to measure on this noncontrast CT but the metabolic portion measures approximately 2.8 cm. This lesion is intensely metabolic with SUV max equal 11.2. There is partial postobstructive collapse in the RIGHT upper lobe with mild metabolic activity. There is a hypermetabolic subcarinal and RIGHT lower paratracheal lymph node. RIGHT lower paratracheal lymph node with SUV max equals 6.2. There no contralateral hypermetabolic nodes. Nodes hypermetabolic supraclavicular nodes. No additional hypermetabolic pulmonary nodules per ABDOMEN/PELVIS Benign adrenal adenoma of the LEFT adrenal gland. No abnormal metabolic activity liver. Pancreas, spleen, kidneys are normal. Intense uptake in the bowel associated with metformin. No hypermetabolic abdominopelvic lymph nodes. SKELETON No focal hypermetabolic activity to suggest skeletal metastasis. IMPRESSION: 1. Hypermetabolic RIGHT hilar mass with partial postobstructive collapse of the RIGHT upper lobe consists with bronchogenic carcinoma. 2. Subcarinal and ipsilateral nodal metastasis. 3. No evidence distant metastatic disease. 4. FDG PET scan staging T2b N2 M0 5. Hypermetabolic LEFT parotid nodule. 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 Consider ENT consultation. Electronically Signed   By: Suzy Bouchard M.D.   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  PLAN:    1. 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  Patient was also discussed at cancer conference last week.  MRI of the brain is negative. PET scan results reviewed independently and reported as above.  Patient will benefit from concurrent chemotherapy using carboplatinum and Taxol weekly along with daily XRT.  Given the stage of his disease, he will also benefit from maintenance  immunotherapy with Imfinzi at the conclusion of his chemotherapy and XRT.  Patient has declined port placement at this time, but will reconsider in the future.  Proceed with cycle 1 of weekly carboplatinum and Taxol today.  He will initiate XRT on June 11, 2017.  Return to clinic in 1 week for further evaluation and consideration of cycle 2. 2.  Hyperglycemia: Monitor.  Patient is receiving dexamethasone as a premedication.  Approximately 30 minutes was spent in discussion of which greater than 50% was consultation.  Patient expressed understanding and was in agreement with this plan. He also understands that He can call clinic at any time with any questions, concerns, or complaints.   Cancer Staging Squamous cell lung cancer, left [facility]) Staging form: Lung, AJCC 8th Edition - Clinical stage from 05/16/2017: Stage IIIB (cT3, cN2, cM0) - Signed by Lloyd Huger, MD on 05/16/2017   Lloyd Huger, MD   06/07/2017 10:41 AM

## 2017-06-05 ENCOUNTER — Encounter: Payer: Self-pay | Admitting: *Deleted

## 2017-06-05 ENCOUNTER — Inpatient Hospital Stay (HOSPITAL_BASED_OUTPATIENT_CLINIC_OR_DEPARTMENT_OTHER): Payer: BLUE CROSS/BLUE SHIELD | Admitting: Oncology

## 2017-06-05 ENCOUNTER — Inpatient Hospital Stay: Payer: BLUE CROSS/BLUE SHIELD

## 2017-06-05 VITALS — BP 124/81 | HR 87 | Temp 97.4°F | Resp 18 | Wt 210.6 lb

## 2017-06-05 DIAGNOSIS — C3411 Malignant neoplasm of upper lobe, right bronchus or lung: Secondary | ICD-10-CM | POA: Diagnosis not present

## 2017-06-05 DIAGNOSIS — F17210 Nicotine dependence, cigarettes, uncomplicated: Secondary | ICD-10-CM

## 2017-06-05 DIAGNOSIS — I10 Essential (primary) hypertension: Secondary | ICD-10-CM | POA: Diagnosis not present

## 2017-06-05 DIAGNOSIS — E1165 Type 2 diabetes mellitus with hyperglycemia: Secondary | ICD-10-CM | POA: Diagnosis not present

## 2017-06-05 DIAGNOSIS — J449 Chronic obstructive pulmonary disease, unspecified: Secondary | ICD-10-CM | POA: Diagnosis not present

## 2017-06-05 DIAGNOSIS — Z8673 Personal history of transient ischemic attack (TIA), and cerebral infarction without residual deficits: Secondary | ICD-10-CM | POA: Diagnosis not present

## 2017-06-05 DIAGNOSIS — Z7984 Long term (current) use of oral hypoglycemic drugs: Secondary | ICD-10-CM | POA: Diagnosis not present

## 2017-06-05 DIAGNOSIS — Z8781 Personal history of (healed) traumatic fracture: Secondary | ICD-10-CM

## 2017-06-05 DIAGNOSIS — J3489 Other specified disorders of nose and nasal sinuses: Secondary | ICD-10-CM

## 2017-06-05 DIAGNOSIS — C3492 Malignant neoplasm of unspecified part of left bronchus or lung: Secondary | ICD-10-CM

## 2017-06-05 DIAGNOSIS — C3412 Malignant neoplasm of upper lobe, left bronchus or lung: Secondary | ICD-10-CM | POA: Diagnosis not present

## 2017-06-05 DIAGNOSIS — Z79899 Other long term (current) drug therapy: Secondary | ICD-10-CM

## 2017-06-05 LAB — CBC WITH DIFFERENTIAL/PLATELET
BASOS PCT: 1 %
Basophils Absolute: 0.1 10*3/uL (ref 0–0.1)
EOS ABS: 0.5 10*3/uL (ref 0–0.7)
EOS PCT: 4 %
HCT: 43.8 % (ref 40.0–52.0)
HEMOGLOBIN: 14.6 g/dL (ref 13.0–18.0)
Lymphocytes Relative: 16 %
Lymphs Abs: 1.9 10*3/uL (ref 1.0–3.6)
MCH: 28.9 pg (ref 26.0–34.0)
MCHC: 33.3 g/dL (ref 32.0–36.0)
MCV: 86.8 fL (ref 80.0–100.0)
MONOS PCT: 7 %
Monocytes Absolute: 0.9 10*3/uL (ref 0.2–1.0)
NEUTROS PCT: 72 %
Neutro Abs: 8.4 10*3/uL — ABNORMAL HIGH (ref 1.4–6.5)
PLATELETS: 343 10*3/uL (ref 150–440)
RBC: 5.05 MIL/uL (ref 4.40–5.90)
RDW: 13.8 % (ref 11.5–14.5)
WBC: 11.7 10*3/uL — AB (ref 3.8–10.6)

## 2017-06-05 LAB — COMPREHENSIVE METABOLIC PANEL
ALK PHOS: 97 U/L (ref 38–126)
ALT: 14 U/L — AB (ref 17–63)
AST: 16 U/L (ref 15–41)
Albumin: 3.9 g/dL (ref 3.5–5.0)
Anion gap: 10 (ref 5–15)
BILIRUBIN TOTAL: 0.5 mg/dL (ref 0.3–1.2)
BUN: 15 mg/dL (ref 6–20)
CALCIUM: 9.2 mg/dL (ref 8.9–10.3)
CO2: 28 mmol/L (ref 22–32)
CREATININE: 0.79 mg/dL (ref 0.61–1.24)
Chloride: 96 mmol/L — ABNORMAL LOW (ref 101–111)
Glucose, Bld: 209 mg/dL — ABNORMAL HIGH (ref 65–99)
Potassium: 3.8 mmol/L (ref 3.5–5.1)
Sodium: 134 mmol/L — ABNORMAL LOW (ref 135–145)
Total Protein: 7.3 g/dL (ref 6.5–8.1)

## 2017-06-05 MED ORDER — FAMOTIDINE IN NACL 20-0.9 MG/50ML-% IV SOLN
20.0000 mg | Freq: Once | INTRAVENOUS | Status: AC
  Administered 2017-06-05: 20 mg via INTRAVENOUS
  Filled 2017-06-05: qty 50

## 2017-06-05 MED ORDER — SODIUM CHLORIDE 0.9 % IV SOLN
300.0000 mg | Freq: Once | INTRAVENOUS | Status: AC
  Administered 2017-06-05: 300 mg via INTRAVENOUS
  Filled 2017-06-05: qty 30

## 2017-06-05 MED ORDER — DEXAMETHASONE SODIUM PHOSPHATE 10 MG/ML IJ SOLN
10.0000 mg | Freq: Once | INTRAMUSCULAR | Status: AC
  Administered 2017-06-05: 10 mg via INTRAVENOUS
  Filled 2017-06-05: qty 1

## 2017-06-05 MED ORDER — SODIUM CHLORIDE 0.9 % IV SOLN
45.0000 mg/m2 | Freq: Once | INTRAVENOUS | Status: AC
  Administered 2017-06-05: 96 mg via INTRAVENOUS
  Filled 2017-06-05: qty 16

## 2017-06-05 MED ORDER — PALONOSETRON HCL INJECTION 0.25 MG/5ML
0.2500 mg | Freq: Once | INTRAVENOUS | Status: AC
  Administered 2017-06-05: 0.25 mg via INTRAVENOUS
  Filled 2017-06-05: qty 5

## 2017-06-05 MED ORDER — SODIUM CHLORIDE 0.9 % IV SOLN
Freq: Once | INTRAVENOUS | Status: AC
  Administered 2017-06-05: 09:00:00 via INTRAVENOUS
  Filled 2017-06-05: qty 1000

## 2017-06-05 MED ORDER — SODIUM CHLORIDE 0.9 % IV SOLN: 10.0000 mg | Freq: Once | INTRAVENOUS | Status: DC

## 2017-06-05 MED ORDER — DIPHENHYDRAMINE HCL 50 MG/ML IJ SOLN
25.0000 mg | Freq: Once | INTRAMUSCULAR | Status: AC
  Administered 2017-06-05: 25 mg via INTRAVENOUS
  Filled 2017-06-05: qty 1

## 2017-06-05 NOTE — Progress Notes (Signed)
  Oncology Nurse Navigator Documentation  Navigator Location: [facility]Med Onc (06/05/17 0900)   )Navigator Encounter Type: Treatment (06/05/17 0900)     Confirmed Diagnosis Date: 05/13/17 (06/05/17 0900)             Treatment Initiated Date: 06/05/17 (06/05/17 0900) Patient Visit Type: MedOnc (06/05/17 0900) Treatment Phase: First Chemo Tx (06/05/17 0900) Barriers/Navigation Needs: No Questions;No Needs (06/05/17 0900)   Interventions: None required (06/05/17 0900)             met with patient prior to receiving first chemo treatment today. Pt had no questions or needs at this time. Informed pt to call with any questions or needs. Pt verbalized understanding. Nothing further needed at this time.         Time Spent with Patient: 15 (06/05/17 0900)

## 2017-06-09 DIAGNOSIS — C3492 Malignant neoplasm of unspecified part of left bronchus or lung: Secondary | ICD-10-CM | POA: Diagnosis not present

## 2017-06-10 NOTE — Progress Notes (Signed)
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  INTERVAL HISTORY: Patient returns to clinic today for further evaluation and consideration of cycle 2 of weekly carboplatinum and Taxol.  Patient initiated concurrent XRT today as well.  He tolerated his first infusion without significant side effects.  He admits to increased indigestion and "gas pains", but otherwise feels well and is asymptomatic. He has no neurologic complaints.  He denies any recent fevers.  He has a good appetite and denies weight loss.  He denies any chest pain, cough, shortness of breath, or hemoptysis.  He has no nausea, vomiting, constipation, or diarrhea.  He has no urinary complaints.  Patient offers no further specific complaints today.  REVIEW OF SYSTEMS:   Review of Systems  Constitutional: Negative.  Negative for fever, malaise/fatigue and weight loss.  Respiratory: Negative.  Negative for cough, hemoptysis and shortness of breath.   Cardiovascular: Negative.  Negative for chest pain and leg swelling.  Gastrointestinal: Positive for abdominal pain and heartburn.  Genitourinary: Negative.   Musculoskeletal: Positive for joint pain.  Skin: Negative.  Negative for rash.  Neurological: Negative.  Negative for sensory change and weakness.  Psychiatric/Behavioral: Negative.  The patient is not nervous/anxious.     As per HPI. Otherwise, a complete review of systems is negative.  PAST MEDICAL HISTORY: Past Medical History:  Diagnosis Date  . COPD (chronic obstructive pulmonary disease) ([place])   . Diabetes mellitus without complication ([place])   . Hypertension   . Pneumonia     PAST SURGICAL HISTORY: Past Surgical History:    Procedure Laterality Date  . ENDOBRONCHIAL ULTRASOUND N/A 05/12/2017   Procedure: ENDOBRONCHIAL ULTRASOUND;  Surgeon: Laverle Hobby, MD;  Location: [facility] ORS;  Service: Pulmonary;  Laterality: N/A;  . FRACTURE SURGERY Left    ANKLE X 2  . TONSILLECTOMY      FAMILY HISTORY: Family History  Problem Relation Age of Onset  . Stroke Mother   . Diabetes Mother   . Hypertension Mother   . Diabetes Father   . Hypertension Father   . Diabetes Sister   . Diabetes Brother   . Heart attack Paternal Uncle   . Stroke Maternal Grandmother     ADVANCED DIRECTIVES (Y/N):  N  HEALTH MAINTENANCE: Social History   Tobacco Use  . Smoking status: Current Every Day Smoker    Packs/day: 1.50  . Smokeless tobacco: Never Used  Substance Use Topics  . Alcohol use: No  . Drug use: Not on file     Colonoscopy:  PAP:  Bone density:  Lipid panel:  Allergies  Allergen Reactions  . Shrimp [Shellfish Allergy] Nausea And Vomiting    Current Outpatient Medications  Medication Sig Dispense Refill  . albuterol (PROVENTIL HFA;VENTOLIN HFA) 108 (90 BASE) MCG/ACT inhaler Inhale 2 puffs into the lungs every 6 (six) hours as needed for wheezing or shortness of breath.    Marland Kitchen albuterol (PROVENTIL) (2.5 MG/3ML) 0.083% nebulizer solution Take 2.5 mg every 6 (six) hours as needed by nebulization for wheezing or shortness of breath.    . beclomethasone (QVAR) 80 MCG/ACT inhaler Inhale 1 puff into the lungs 2 (two) times daily.    Marland Kitchen glipiZIDE (GLUCOTROL) 5 MG tablet Take 5 mg by mouth daily  before breakfast.    . ibuprofen (ADVIL,MOTRIN) 200 MG tablet Take 400-800 mg every 8 (eight) hours as needed by mouth (for pain.).    Marland Kitchen lisinopril-hydrochlorothiazide (PRINZIDE,ZESTORETIC) 20-25 MG tablet Take 1 tablet by mouth daily.    . metFORMIN (GLUCOPHAGE) 1000 MG tablet Take 1,000 mg by mouth 2 (two) times daily.    . ondansetron (ZOFRAN) 8 MG tablet Take 1 tablet (8 mg total) by mouth 2 (two) times daily as  needed for refractory nausea / vomiting. 30 tablet 2  . prochlorperazine (COMPAZINE) 10 MG tablet Take 1 tablet (10 mg total) by mouth every 6 (six) hours as needed (Nausea or vomiting). 60 tablet 2  . omeprazole (PRILOSEC) 20 MG capsule Take 1 capsule (20 mg total) by mouth daily. 30 capsule 2  . traMADol (ULTRAM) 50 MG tablet Take 1 tablet (50 mg total) by mouth every 6 (six) hours as needed. 30 tablet 0   No current facility-administered medications for this visit.     OBJECTIVE: Vitals:   06/12/17 0934  BP: (!) 159/89  Pulse: 84  Resp: 20  Temp: (!) 97.3 F (36.3 C)     Body mass index is 32.57 kg/m.    ECOG FS:0 - Asymptomatic  General: Well-developed, well-nourished, no acute distress. Eyes: Pink conjunctiva, anicteric sclera. Lungs: Clear to auscultation bilaterally. Heart: Regular rate and rhythm. No rubs, murmurs, or gallops. Abdomen: Soft, nontender, nondistended. No organomegaly noted, normoactive bowel sounds. Musculoskeletal: No edema, cyanosis, or clubbing. Neuro: Alert, answering all questions appropriately. Cranial nerves grossly intact. Skin: No rashes or petechiae noted. Psych: Normal affect.  LAB RESULTS:  Lab Results  Component Value Date   NA 136 06/12/2017   K 3.9 06/12/2017   CL 98 (L) 06/12/2017   CO2 27 06/12/2017   GLUCOSE 191 (H) 06/12/2017   BUN 16 06/12/2017   CREATININE 0.85 06/12/2017   CALCIUM 9.0 06/12/2017   PROT 6.8 06/12/2017   ALBUMIN 3.6 06/12/2017   AST 22 06/12/2017   ALT 17 06/12/2017   ALKPHOS 99 06/12/2017   BILITOT 0.4 06/12/2017   GFRNONAA >60 06/12/2017   GFRAA >60 06/12/2017    Lab Results  Component Value Date   WBC 11.0 (H) 06/12/2017   NEUTROABS 7.7 (H) 06/12/2017   HGB 14.3 06/12/2017   HCT 42.6 06/12/2017   MCV 86.4 06/12/2017   PLT 374 06/12/2017     STUDIES: Mr [name] LF Contrast  Result Date: 05/23/2017 CLINICAL DATA:  Recent diagnosis of lung cancer.  Staging. EXAM: MRI HEAD WITHOUT AND WITH  CONTRAST TECHNIQUE: Multiplanar, multiecho pulse sequences of the brain and surrounding structures were obtained without and with intravenous contrast. CONTRAST:  10mL MULTIHANCE GADOBENATE DIMEGLUMINE 529 MG/ML IV SOLN COMPARISON:  None. FINDINGS: Brain: No acute infarction, hemorrhage, hydrocephalus, extra-axial collection or mass lesion. Mild chronic microvascular ischemic type change in the cerebral white matter. Remote lacunar infarct in the left corona radiata. Vascular: Major flow voids and vascular enhancements are preserved. Tiny left frontal developmental venous anomaly that is incidental Skull and upper cervical spine: Negative for marrow lesion. Sinuses/Orbits: Mucosal thickening in the paranasal sinuses. Nasal septal perforation without neighboring nodularity. Other: 18 mm left parotid nodule, larger than expected for normal node. IMPRESSION: 1. No evidence of intracranial metastasis. 2. 18 mm left parotid nodule that is nonspecific, attention on follow-up imaging/PET. Electronically Signed   By: Monte Fantasia M.D.   On: 05/23/2017 09:30   Nm Pet Image Initial (pi) Skull Base To Thigh  Result  Date: 05/27/2017 CLINICAL DATA:  Initial treatment strategy for RIGHT upper lobe consolidation. RIGHT upper lobe squamous cell carcinoma. EXAM: NUCLEAR MEDICINE PET SKULL BASE TO THIGH TECHNIQUE: 13.1 mCi F-18 FDG was injected intravenously. Full-ring PET imaging was performed from the skull base to thigh after the radiotracer. CT data was obtained and used for attenuation correction and anatomic localization. FASTING BLOOD GLUCOSE:  Value: 148 mg/dl COMPARISON:  Chest CT 04/28/2017 FINDINGS: NECK Hypermetabolic nodule within the posterior aspect of the LEFT parotid gland SUV max equals 6.7. This nodule is mildly hyper dense on noncontrast CT measuring 13 mm (image 27, series 3). No hypermetabolic lymph nodes in the neck. CHEST Hypermetabolic RIGHT hilar mass difficult to measure on this noncontrast CT but  the metabolic portion measures approximately 2.8 cm. This lesion is intensely metabolic with SUV max equal 11.2. There is partial postobstructive collapse in the RIGHT upper lobe with mild metabolic activity. There is a hypermetabolic subcarinal and RIGHT lower paratracheal lymph node. RIGHT lower paratracheal lymph node with SUV max equals 6.2. There no contralateral hypermetabolic nodes. Nodes hypermetabolic supraclavicular nodes. No additional hypermetabolic pulmonary nodules per ABDOMEN/PELVIS Benign adrenal adenoma of the LEFT adrenal gland. No abnormal metabolic activity liver. Pancreas, spleen, kidneys are normal. Intense uptake in the bowel associated with metformin. No hypermetabolic abdominopelvic lymph nodes. SKELETON No focal hypermetabolic activity to suggest skeletal metastasis. IMPRESSION: 1. Hypermetabolic RIGHT hilar mass with partial postobstructive collapse of the RIGHT upper lobe consists with bronchogenic carcinoma. 2. Subcarinal and ipsilateral nodal metastasis. 3. No evidence distant metastatic disease. 4. FDG PET scan staging T2b N2 M0 5. Hypermetabolic LEFT parotid nodule. 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 Consider ENT consultation. Electronically Signed   By: Suzy Bouchard M.D.   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  PLAN:    1. 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  Patient was also discussed at cancer conference.  MRI of the brain is negative. PET scan results reviewed independently with hypermetabolic right hilar mass as well as subcarinal and ipsilateral nodal metastasis.  No evidence of metastatic disease.  Patient will benefit from concurrent chemotherapy using carboplatinum and Taxol weekly along with daily XRT.  Given the stage of his disease, he will also benefit from maintenance immunotherapy  with Imfinzi at the conclusion of his chemotherapy and XRT.  Patient has declined port placement at this time, but will reconsider in the future.  Proceed with cycle 2 of weekly carboplatinum and Taxol today.  Continue daily XRT.  Return to clinic in 1 week for further evaluation and consideration of cycle 3. 2.  Hyperglycemia: Monitor.  Patient is receiving dexamethasone as a premedication.  3.  Indigestion: Patient was given a prescription for omeprazole today. 4.  Pain: Patient was given a prescription for tramadol.   Patient expressed understanding and was in agreement with this plan. He also understands that He can call clinic at any time with any questions, concerns, or complaints.   Cancer Staging Squamous cell lung cancer, left [facility]) Staging form: Lung, AJCC 8th Edition - Clinical stage from 05/16/2017: Stage IIIB (cT3, cN2, cM0) - Signed by Lloyd Huger, MD on 05/16/2017   Lloyd Huger, MD   06/13/2017 9:32 AM

## 2017-06-11 ENCOUNTER — Ambulatory Visit
Admission: RE | Admit: 2017-06-11 | Discharge: 2017-06-11 | Disposition: A | Discharge status: Home / Self Care | Payer: BLUE CROSS/BLUE SHIELD | Source: Ambulatory Visit | Attending: Radiation Oncology | Admitting: Radiation Oncology

## 2017-06-12 ENCOUNTER — Encounter: Payer: Self-pay | Admitting: Oncology

## 2017-06-12 ENCOUNTER — Inpatient Hospital Stay: Payer: BLUE CROSS/BLUE SHIELD

## 2017-06-12 ENCOUNTER — Other Ambulatory Visit: Payer: Self-pay

## 2017-06-12 ENCOUNTER — Ambulatory Visit
Admission: RE | Admit: 2017-06-12 | Discharge: 2017-06-12 | Disposition: A | Discharge status: Home / Self Care | Payer: BLUE CROSS/BLUE SHIELD | Source: Ambulatory Visit | Attending: Radiation Oncology | Admitting: Radiation Oncology

## 2017-06-12 ENCOUNTER — Inpatient Hospital Stay (HOSPITAL_BASED_OUTPATIENT_CLINIC_OR_DEPARTMENT_OTHER): Payer: BLUE CROSS/BLUE SHIELD | Admitting: Oncology

## 2017-06-12 VITALS — BP 159/89 | HR 84 | Temp 97.3°F | Resp 20 | Wt 214.2 lb

## 2017-06-12 DIAGNOSIS — C3492 Malignant neoplasm of unspecified part of left bronchus or lung: Secondary | ICD-10-CM

## 2017-06-12 DIAGNOSIS — F17210 Nicotine dependence, cigarettes, uncomplicated: Secondary | ICD-10-CM

## 2017-06-12 DIAGNOSIS — Z8673 Personal history of transient ischemic attack (TIA), and cerebral infarction without residual deficits: Secondary | ICD-10-CM

## 2017-06-12 DIAGNOSIS — C3412 Malignant neoplasm of upper lobe, left bronchus or lung: Secondary | ICD-10-CM | POA: Diagnosis not present

## 2017-06-12 DIAGNOSIS — M2550 Pain in unspecified joint: Secondary | ICD-10-CM | POA: Diagnosis not present

## 2017-06-12 DIAGNOSIS — E1165 Type 2 diabetes mellitus with hyperglycemia: Secondary | ICD-10-CM | POA: Diagnosis not present

## 2017-06-12 DIAGNOSIS — J3489 Other specified disorders of nose and nasal sinuses: Secondary | ICD-10-CM | POA: Diagnosis not present

## 2017-06-12 DIAGNOSIS — K30 Functional dyspepsia: Secondary | ICD-10-CM | POA: Diagnosis not present

## 2017-06-12 DIAGNOSIS — Z79899 Other long term (current) drug therapy: Secondary | ICD-10-CM | POA: Diagnosis not present

## 2017-06-12 DIAGNOSIS — I10 Essential (primary) hypertension: Secondary | ICD-10-CM | POA: Diagnosis not present

## 2017-06-12 DIAGNOSIS — R141 Gas pain: Secondary | ICD-10-CM | POA: Diagnosis not present

## 2017-06-12 DIAGNOSIS — Z8781 Personal history of (healed) traumatic fracture: Secondary | ICD-10-CM | POA: Diagnosis not present

## 2017-06-12 DIAGNOSIS — J449 Chronic obstructive pulmonary disease, unspecified: Secondary | ICD-10-CM

## 2017-06-12 DIAGNOSIS — Z7984 Long term (current) use of oral hypoglycemic drugs: Secondary | ICD-10-CM

## 2017-06-12 LAB — COMPREHENSIVE METABOLIC PANEL
ALK PHOS: 99 U/L (ref 38–126)
ALT: 17 U/L (ref 17–63)
AST: 22 U/L (ref 15–41)
Albumin: 3.6 g/dL (ref 3.5–5.0)
Anion gap: 11 (ref 5–15)
BUN: 16 mg/dL (ref 6–20)
CALCIUM: 9 mg/dL (ref 8.9–10.3)
CHLORIDE: 98 mmol/L — AB (ref 101–111)
CO2: 27 mmol/L (ref 22–32)
CREATININE: 0.85 mg/dL (ref 0.61–1.24)
GFR calc Af Amer: >60 mL/min (ref >60)
GFR calc non Af Amer: >60 mL/min (ref >60)
Glucose, Bld: 191 mg/dL — ABNORMAL HIGH (ref 65–99)
Potassium: 3.9 mmol/L (ref 3.5–5.1)
SODIUM: 136 mmol/L (ref 135–145)
Total Bilirubin: 0.4 mg/dL (ref 0.3–1.2)
Total Protein: 6.8 g/dL (ref 6.5–8.1)

## 2017-06-12 LAB — CBC WITH DIFFERENTIAL/PLATELET
Basophils Absolute: 0.1 10*3/uL (ref 0–0.1)
Basophils Relative: 1 %
EOS ABS: 0.4 10*3/uL (ref 0–0.7)
EOS PCT: 4 %
HCT: 42.6 % (ref 40.0–52.0)
HEMOGLOBIN: 14.3 g/dL (ref 13.0–18.0)
LYMPHS ABS: 2.1 10*3/uL (ref 1.0–3.6)
Lymphocytes Relative: 19 %
MCH: 29 pg (ref 26.0–34.0)
MCHC: 33.6 g/dL (ref 32.0–36.0)
MCV: 86.4 fL (ref 80.0–100.0)
MONO ABS: 0.7 10*3/uL (ref 0.2–1.0)
MONOS PCT: 6 %
Neutro Abs: 7.7 10*3/uL — ABNORMAL HIGH (ref 1.4–6.5)
Neutrophils Relative %: 70 %
PLATELETS: 374 10*3/uL (ref 150–440)
RBC: 4.93 MIL/uL (ref 4.40–5.90)
RDW: 13.6 % (ref 11.5–14.5)
WBC: 11 10*3/uL — ABNORMAL HIGH (ref 3.8–10.6)

## 2017-06-12 MED ORDER — PACLITAXEL CHEMO INJECTION 300 MG/50ML
45.0000 mg/m2 | Freq: Once | INTRAVENOUS | Status: AC
  Administered 2017-06-12: 96 mg via INTRAVENOUS
  Filled 2017-06-12: qty 16

## 2017-06-12 MED ORDER — TRAMADOL HCL 50 MG PO TABS: 50.0000 mg | ORAL_TABLET | Freq: Four times a day (QID) | ORAL | 0 refills | Status: DC | PRN

## 2017-06-12 MED ORDER — DEXAMETHASONE SODIUM PHOSPHATE 10 MG/ML IJ SOLN
10.0000 mg | Freq: Once | INTRAMUSCULAR | Status: AC
  Administered 2017-06-12: 10 mg via INTRAVENOUS
  Filled 2017-06-12: qty 1

## 2017-06-12 MED ORDER — SODIUM CHLORIDE 0.9 % IV SOLN
Freq: Once | INTRAVENOUS | Status: AC
  Administered 2017-06-12: 11:00:00 via INTRAVENOUS
  Filled 2017-06-12: qty 1000

## 2017-06-12 MED ORDER — FAMOTIDINE IN NACL 20-0.9 MG/50ML-% IV SOLN
20.0000 mg | Freq: Once | INTRAVENOUS | Status: AC
  Administered 2017-06-12: 20 mg via INTRAVENOUS

## 2017-06-12 MED ORDER — DEXAMETHASONE SODIUM PHOSPHATE 100 MG/10ML IJ SOLN: 10.0000 mg | Freq: Once | INTRAMUSCULAR | Status: DC

## 2017-06-12 MED ORDER — PALONOSETRON HCL INJECTION 0.25 MG/5ML
0.2500 mg | Freq: Once | INTRAVENOUS | Status: AC
  Administered 2017-06-12: 0.25 mg via INTRAVENOUS
  Filled 2017-06-12: qty 5

## 2017-06-12 MED ORDER — CARBOPLATIN CHEMO INJECTION 450 MG/45ML
300.0000 mg | Freq: Once | INTRAVENOUS | Status: AC
  Administered 2017-06-12: 300 mg via INTRAVENOUS
  Filled 2017-06-12: qty 30

## 2017-06-12 MED ORDER — OMEPRAZOLE 20 MG PO CPDR: 20.0000 mg | DELAYED_RELEASE_CAPSULE | Freq: Every day | ORAL | 2 refills | Status: DC

## 2017-06-12 MED ORDER — DIPHENHYDRAMINE HCL 50 MG/ML IJ SOLN
25.0000 mg | Freq: Once | INTRAMUSCULAR | Status: AC
  Administered 2017-06-12: 25 mg via INTRAVENOUS
  Filled 2017-06-12: qty 1

## 2017-06-12 NOTE — Progress Notes (Signed)
Patient here today for follow up and treatment consideration regarding lung cancer. Patient reports headaches for 2 days after treatment. Patient also reports worsening of gas pain and reflux since treatment, currently has been taking Zantac.

## 2017-06-13 ENCOUNTER — Ambulatory Visit: Payer: BLUE CROSS/BLUE SHIELD

## 2017-06-13 ENCOUNTER — Telehealth: Payer: Self-pay | Admitting: *Deleted

## 2017-06-13 MED ORDER — TRAMADOL HCL 50 MG PO TABS: 50.0000 mg | ORAL_TABLET | Freq: Four times a day (QID) | ORAL | 0 refills | Status: DC | PRN

## 2017-06-13 NOTE — Telephone Encounter (Signed)
Pt called in to report that has lost prescription for tramadol that was given to him yesterday. Wants to know if prescription can be escribed to [facility] in [place].

## 2017-06-16 ENCOUNTER — Ambulatory Visit
Admission: RE | Admit: 2017-06-16 | Discharge: 2017-06-16 | Disposition: A | Discharge status: Home / Self Care | Payer: BLUE CROSS/BLUE SHIELD | Source: Ambulatory Visit | Attending: Radiation Oncology | Admitting: Radiation Oncology

## 2017-06-16 DIAGNOSIS — C3492 Malignant neoplasm of unspecified part of left bronchus or lung: Secondary | ICD-10-CM | POA: Diagnosis not present

## 2017-06-18 ENCOUNTER — Ambulatory Visit
Admission: RE | Admit: 2017-06-18 | Discharge: 2017-06-18 | Disposition: A | Discharge status: Home / Self Care | Payer: BLUE CROSS/BLUE SHIELD | Source: Ambulatory Visit | Attending: Radiation Oncology | Admitting: Radiation Oncology

## 2017-06-18 DIAGNOSIS — C3492 Malignant neoplasm of unspecified part of left bronchus or lung: Secondary | ICD-10-CM

## 2017-06-18 NOTE — Progress Notes (Signed)
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  INTERVAL HISTORY: Patient returns to clinic today for further evaluation and consideration of cycle 3 of weekly carboplatinum and Taxol.  He has increased congestion and cough, but denies any fevers.  He otherwise feels well and is asymptomatic. He has no neurologic complaints. He has a good appetite and denies weight loss.  He denies any chest pain, shortness of breath, or hemoptysis.  He has no nausea, vomiting, constipation, or diarrhea.  He has no urinary complaints.  Patient offers no further specific complaints today.  REVIEW OF SYSTEMS:   Review of Systems  Constitutional: Negative.  Negative for fever, malaise/fatigue and weight loss.  HENT: Positive for congestion.   Respiratory: Positive for cough. Negative for hemoptysis and shortness of breath.   Cardiovascular: Negative.  Negative for chest pain and leg swelling.  Gastrointestinal: Negative for abdominal pain, heartburn, nausea and vomiting.  Genitourinary: Negative.   Musculoskeletal: Positive for joint pain.  Skin: Negative.  Negative for rash.  Neurological: Negative.  Negative for sensory change and weakness.  Psychiatric/Behavioral: Negative.  The patient is not nervous/anxious.     As per HPI. Otherwise, a complete review of systems is negative.  PAST MEDICAL HISTORY: Past Medical History:  Diagnosis Date  . COPD (chronic obstructive pulmonary disease) ([place])   . Diabetes mellitus without complication ([place])   . Hypertension   . Pneumonia     PAST SURGICAL HISTORY: Past Surgical History:  Procedure Laterality Date  . ENDOBRONCHIAL ULTRASOUND N/A 05/12/2017   Procedure:  ENDOBRONCHIAL ULTRASOUND;  Surgeon: Laverle Hobby, MD;  Location: [facility] ORS;  Service: Pulmonary;  Laterality: N/A;  . FRACTURE SURGERY Left    ANKLE X 2  . TONSILLECTOMY      FAMILY HISTORY: Family History  Problem Relation Age of Onset  . Stroke Mother   . Diabetes Mother   . Hypertension Mother   . Diabetes Father   . Hypertension Father   . Diabetes Sister   . Diabetes Brother   . Heart attack Paternal Uncle   . Stroke Maternal Grandmother     ADVANCED DIRECTIVES (Y/N):  N  HEALTH MAINTENANCE: Social History   Tobacco Use  . Smoking status: Current Every Day Smoker    Packs/day: 1.50  . Smokeless tobacco: Never Used  Substance Use Topics  . Alcohol use: No  . Drug use: Not on file     Colonoscopy:  PAP:  Bone density:  Lipid panel:  Allergies  Allergen Reactions  . Shrimp [Shellfish Allergy] Nausea And Vomiting    Current Outpatient Medications  Medication Sig Dispense Refill  . albuterol (PROVENTIL HFA;VENTOLIN HFA) 108 (90 BASE) MCG/ACT inhaler Inhale 2 puffs into the lungs every 6 (six) hours as needed for wheezing or shortness of breath.    Marland Kitchen albuterol (PROVENTIL) (2.5 MG/3ML) 0.083% nebulizer solution Take 2.5 mg every 6 (six) hours as needed by nebulization for wheezing or shortness of breath.    . beclomethasone (QVAR) 80 MCG/ACT inhaler Inhale 1 puff into the lungs 2 (two) times daily.    Marland Kitchen glipiZIDE (GLUCOTROL) 5 MG tablet Take 5 mg by mouth daily before breakfast.    . ibuprofen (ADVIL,MOTRIN) 200 MG tablet Take 400-800 mg every 8 (  eight) hours as needed by mouth (for pain.).    Marland Kitchen lisinopril-hydrochlorothiazide (PRINZIDE,ZESTORETIC) 20-25 MG tablet Take 1 tablet by mouth daily.    . metFORMIN (GLUCOPHAGE) 1000 MG tablet Take 1,000 mg by mouth 2 (two) times daily.    Marland Kitchen omeprazole (PRILOSEC) 20 MG capsule Take 1 capsule (20 mg total) by mouth daily. 30 capsule 2  . ondansetron (ZOFRAN) 8 MG tablet Take 1 tablet (8 mg total) by mouth 2 (two)  times daily as needed for refractory nausea / vomiting. 30 tablet 2  . prochlorperazine (COMPAZINE) 10 MG tablet Take 1 tablet (10 mg total) by mouth every 6 (six) hours as needed (Nausea or vomiting). 60 tablet 2  . traMADol (ULTRAM) 50 MG tablet Take 1 tablet (50 mg total) by mouth every 6 (six) hours as needed. 30 tablet 0   No current facility-administered medications for this visit.     OBJECTIVE: Vitals:   06/19/17 1032  BP: (!) 143/83  Pulse: 91  Resp: (!) 24  Temp: 97.7 F (36.5 C)  SpO2: 95%     Body mass index is 32.42 kg/m.    ECOG FS:0 - Asymptomatic  General: Well-developed, well-nourished, no acute distress. Eyes: Pink conjunctiva, anicteric sclera. Lungs: Clear to auscultation bilaterally. Heart: Regular rate and rhythm. No rubs, murmurs, or gallops. Abdomen: Soft, nontender, nondistended. No organomegaly noted, normoactive bowel sounds. Musculoskeletal: No edema, cyanosis, or clubbing. Neuro: Alert, answering all questions appropriately. Cranial nerves grossly intact. Skin: No rashes or petechiae noted. Psych: Normal affect.  LAB RESULTS:  Lab Results  Component Value Date   NA 136 06/19/2017   K 4.5 06/19/2017   CL 98 (L) 06/19/2017   CO2 28 06/19/2017   GLUCOSE 162 (H) 06/19/2017   BUN 15 06/19/2017   CREATININE 0.84 06/19/2017   CALCIUM 9.2 06/19/2017   PROT 7.1 06/19/2017   ALBUMIN 3.9 06/19/2017   AST 19 06/19/2017   ALT 17 06/19/2017   ALKPHOS 97 06/19/2017   BILITOT 0.5 06/19/2017   GFRNONAA >60 06/19/2017   GFRAA >60 06/19/2017    Lab Results  Component Value Date   WBC 8.1 06/19/2017   NEUTROABS 5.9 06/19/2017   HGB 14.8 06/19/2017   HCT 44.6 06/19/2017   MCV 87.7 06/19/2017   PLT 359 06/19/2017     STUDIES: Mr [name] QB Contrast  Result Date: 05/23/2017 CLINICAL DATA:  Recent diagnosis of lung cancer.  Staging. EXAM: MRI HEAD WITHOUT AND WITH CONTRAST TECHNIQUE: Multiplanar, multiecho pulse sequences of the brain and  surrounding structures were obtained without and with intravenous contrast. CONTRAST:  80mL MULTIHANCE GADOBENATE DIMEGLUMINE 529 MG/ML IV SOLN COMPARISON:  None. FINDINGS: Brain: No acute infarction, hemorrhage, hydrocephalus, extra-axial collection or mass lesion. Mild chronic microvascular ischemic type change in the cerebral white matter. Remote lacunar infarct in the left corona radiata. Vascular: Major flow voids and vascular enhancements are preserved. Tiny left frontal developmental venous anomaly that is incidental Skull and upper cervical spine: Negative for marrow lesion. Sinuses/Orbits: Mucosal thickening in the paranasal sinuses. Nasal septal perforation without neighboring nodularity. Other: 18 mm left parotid nodule, larger than expected for normal node. IMPRESSION: 1. No evidence of intracranial metastasis. 2. 18 mm left parotid nodule that is nonspecific, attention on follow-up imaging/PET. Electronically Signed   By: Monte Fantasia M.D.   On: 05/23/2017 09:30   Nm Pet Image Initial (pi) Skull Base To Thigh  Result Date: 05/27/2017 CLINICAL DATA:  Initial treatment strategy for RIGHT upper lobe consolidation. RIGHT upper  lobe squamous cell carcinoma. EXAM: NUCLEAR MEDICINE PET SKULL BASE TO THIGH TECHNIQUE: 13.1 mCi F-18 FDG was injected intravenously. Full-ring PET imaging was performed from the skull base to thigh after the radiotracer. CT data was obtained and used for attenuation correction and anatomic localization. FASTING BLOOD GLUCOSE:  Value: 148 mg/dl COMPARISON:  Chest CT 04/28/2017 FINDINGS: NECK Hypermetabolic nodule within the posterior aspect of the LEFT parotid gland SUV max equals 6.7. This nodule is mildly hyper dense on noncontrast CT measuring 13 mm (image 27, series 3). No hypermetabolic lymph nodes in the neck. CHEST Hypermetabolic RIGHT hilar mass difficult to measure on this noncontrast CT but the metabolic portion measures approximately 2.8 cm. This lesion is intensely  metabolic with SUV max equal 11.2. There is partial postobstructive collapse in the RIGHT upper lobe with mild metabolic activity. There is a hypermetabolic subcarinal and RIGHT lower paratracheal lymph node. RIGHT lower paratracheal lymph node with SUV max equals 6.2. There no contralateral hypermetabolic nodes. Nodes hypermetabolic supraclavicular nodes. No additional hypermetabolic pulmonary nodules per ABDOMEN/PELVIS Benign adrenal adenoma of the LEFT adrenal gland. No abnormal metabolic activity liver. Pancreas, spleen, kidneys are normal. Intense uptake in the bowel associated with metformin. No hypermetabolic abdominopelvic lymph nodes. SKELETON No focal hypermetabolic activity to suggest skeletal metastasis. IMPRESSION: 1. Hypermetabolic RIGHT hilar mass with partial postobstructive collapse of the RIGHT upper lobe consists with bronchogenic carcinoma. 2. Subcarinal and ipsilateral nodal metastasis. 3. No evidence distant metastatic disease. 4. FDG PET scan staging T2b N2 M0 5. Hypermetabolic LEFT parotid nodule. 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 Consider ENT consultation. Electronically Signed   By: Suzy Bouchard M.D.   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  PLAN:    1. 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  Patient was also discussed at cancer conference.  MRI of the brain is negative. PET scan results from May 27, 2017 reviewed independently with hypermetabolic right hilar mass as well as subcarinal and ipsilateral nodal metastasis.  No evidence of metastatic disease.  Patient will benefit from concurrent chemotherapy using carboplatinum and Taxol weekly along with daily XRT.  Given the stage of his disease, he will also benefit from maintenance immunotherapy with Imfinzi at the conclusion of his chemotherapy and  XRT.  Patient has declined port placement at this time, but will reconsider in the future.  Proceed with cycle 3 of weekly carboplatinum and Taxol today.  Continue daily XRT.  Return to clinic in 1 week for further evaluation and consideration of cycle 4. 2.  Hyperglycemia: Monitor.  Patient is receiving dexamethasone as a premedication.  3.  Indigestion: Continue omeprazole as needed. 4.  Pain: Continue tramadol as needed. 5.  Cough/congestion: Patient was instructed to use OTC decongestants as needed.   Patient expressed understanding and was in agreement with this plan. He also understands that He can call clinic at any time with any questions, concerns, or complaints.   Cancer Staging Squamous cell lung cancer, left [facility]) Staging form: Lung, AJCC 8th Edition - Clinical stage from 05/16/2017: Stage IIIB (cT3, cN2, cM0) - Signed by Lloyd Huger, MD on 05/16/2017   Lloyd Huger, MD   06/20/2017 3:00 PM

## 2017-06-19 ENCOUNTER — Inpatient Hospital Stay: Payer: BLUE CROSS/BLUE SHIELD

## 2017-06-19 ENCOUNTER — Inpatient Hospital Stay: Payer: BLUE CROSS/BLUE SHIELD | Attending: Oncology | Admitting: Oncology

## 2017-06-19 ENCOUNTER — Other Ambulatory Visit: Payer: Self-pay

## 2017-06-19 ENCOUNTER — Ambulatory Visit
Admission: RE | Admit: 2017-06-19 | Discharge: 2017-06-19 | Disposition: A | Discharge status: Home / Self Care | Payer: BLUE CROSS/BLUE SHIELD | Source: Ambulatory Visit | Attending: Radiation Oncology | Admitting: Radiation Oncology

## 2017-06-19 ENCOUNTER — Encounter: Payer: Self-pay | Admitting: Oncology

## 2017-06-19 VITALS — BP 143/83 | HR 91 | Temp 97.7°F | Resp 24 | Ht 68.5 in | Wt 216.4 lb

## 2017-06-19 DIAGNOSIS — Z5111 Encounter for antineoplastic chemotherapy: Secondary | ICD-10-CM | POA: Insufficient documentation

## 2017-06-19 DIAGNOSIS — J3489 Other specified disorders of nose and nasal sinuses: Secondary | ICD-10-CM | POA: Diagnosis not present

## 2017-06-19 DIAGNOSIS — R918 Other nonspecific abnormal finding of lung field: Secondary | ICD-10-CM | POA: Diagnosis not present

## 2017-06-19 DIAGNOSIS — E1165 Type 2 diabetes mellitus with hyperglycemia: Secondary | ICD-10-CM | POA: Diagnosis not present

## 2017-06-19 DIAGNOSIS — D696 Thrombocytopenia, unspecified: Secondary | ICD-10-CM | POA: Insufficient documentation

## 2017-06-19 DIAGNOSIS — E860 Dehydration: Secondary | ICD-10-CM | POA: Diagnosis not present

## 2017-06-19 DIAGNOSIS — J019 Acute sinusitis, unspecified: Secondary | ICD-10-CM | POA: Insufficient documentation

## 2017-06-19 DIAGNOSIS — C3492 Malignant neoplasm of unspecified part of left bronchus or lung: Secondary | ICD-10-CM

## 2017-06-19 DIAGNOSIS — Z8673 Personal history of transient ischemic attack (TIA), and cerebral infarction without residual deficits: Secondary | ICD-10-CM | POA: Diagnosis not present

## 2017-06-19 DIAGNOSIS — D3502 Benign neoplasm of left adrenal gland: Secondary | ICD-10-CM | POA: Diagnosis not present

## 2017-06-19 DIAGNOSIS — K30 Functional dyspepsia: Secondary | ICD-10-CM | POA: Diagnosis not present

## 2017-06-19 DIAGNOSIS — Z79899 Other long term (current) drug therapy: Secondary | ICD-10-CM | POA: Insufficient documentation

## 2017-06-19 DIAGNOSIS — F17210 Nicotine dependence, cigarettes, uncomplicated: Secondary | ICD-10-CM | POA: Insufficient documentation

## 2017-06-19 DIAGNOSIS — J449 Chronic obstructive pulmonary disease, unspecified: Secondary | ICD-10-CM | POA: Diagnosis not present

## 2017-06-19 DIAGNOSIS — I10 Essential (primary) hypertension: Secondary | ICD-10-CM | POA: Diagnosis not present

## 2017-06-19 DIAGNOSIS — Z8701 Personal history of pneumonia (recurrent): Secondary | ICD-10-CM | POA: Diagnosis not present

## 2017-06-19 DIAGNOSIS — B9689 Other specified bacterial agents as the cause of diseases classified elsewhere: Secondary | ICD-10-CM | POA: Insufficient documentation

## 2017-06-19 DIAGNOSIS — Z7984 Long term (current) use of oral hypoglycemic drugs: Secondary | ICD-10-CM | POA: Diagnosis not present

## 2017-06-19 LAB — CBC WITH DIFFERENTIAL/PLATELET
Basophils Absolute: 0.1 10*3/uL (ref 0–0.1)
Basophils Relative: 1 %
EOS PCT: 2 %
Eosinophils Absolute: 0.2 10*3/uL (ref 0–0.7)
HCT: 44.6 % (ref 40.0–52.0)
Hemoglobin: 14.8 g/dL (ref 13.0–18.0)
LYMPHS ABS: 1.3 10*3/uL (ref 1.0–3.6)
LYMPHS PCT: 16 %
MCH: 29 pg (ref 26.0–34.0)
MCHC: 33.1 g/dL (ref 32.0–36.0)
MCV: 87.7 fL (ref 80.0–100.0)
MONO ABS: 0.6 10*3/uL (ref 0.2–1.0)
Monocytes Relative: 7 %
Neutro Abs: 5.9 10*3/uL (ref 1.4–6.5)
Neutrophils Relative %: 74 %
PLATELETS: 359 10*3/uL (ref 150–440)
RBC: 5.09 MIL/uL (ref 4.40–5.90)
RDW: 14.1 % (ref 11.5–14.5)
WBC: 8.1 10*3/uL (ref 3.8–10.6)

## 2017-06-19 LAB — COMPREHENSIVE METABOLIC PANEL
ALBUMIN: 3.9 g/dL (ref 3.5–5.0)
ALT: 17 U/L (ref 17–63)
AST: 19 U/L (ref 15–41)
Alkaline Phosphatase: 97 U/L (ref 38–126)
Anion gap: 10 (ref 5–15)
BUN: 15 mg/dL (ref 6–20)
CO2: 28 mmol/L (ref 22–32)
CREATININE: 0.84 mg/dL (ref 0.61–1.24)
Calcium: 9.2 mg/dL (ref 8.9–10.3)
Chloride: 98 mmol/L — ABNORMAL LOW (ref 101–111)
GFR calc Af Amer: >60 mL/min (ref >60)
GLUCOSE: 162 mg/dL — AB (ref 65–99)
POTASSIUM: 4.5 mmol/L (ref 3.5–5.1)
Sodium: 136 mmol/L (ref 135–145)
Total Bilirubin: 0.5 mg/dL (ref 0.3–1.2)
Total Protein: 7.1 g/dL (ref 6.5–8.1)

## 2017-06-19 MED ORDER — SODIUM CHLORIDE 0.9 % IV SOLN
300.0000 mg | Freq: Once | INTRAVENOUS | Status: AC
  Administered 2017-06-19: 300 mg via INTRAVENOUS
  Filled 2017-06-19: qty 30

## 2017-06-19 MED ORDER — FAMOTIDINE IN NACL 20-0.9 MG/50ML-% IV SOLN
20.0000 mg | Freq: Once | INTRAVENOUS | Status: AC
  Administered 2017-06-19: 20 mg via INTRAVENOUS
  Filled 2017-06-19: qty 50

## 2017-06-19 MED ORDER — SODIUM CHLORIDE 0.9 % IV SOLN
Freq: Once | INTRAVENOUS | Status: AC
  Administered 2017-06-19: 11:00:00 via INTRAVENOUS
  Filled 2017-06-19: qty 1000

## 2017-06-19 MED ORDER — SODIUM CHLORIDE 0.9 % IV SOLN
45.0000 mg/m2 | Freq: Once | INTRAVENOUS | Status: AC
  Administered 2017-06-19: 96 mg via INTRAVENOUS
  Filled 2017-06-19: qty 16

## 2017-06-19 MED ORDER — DIPHENHYDRAMINE HCL 50 MG/ML IJ SOLN
25.0000 mg | Freq: Once | INTRAMUSCULAR | Status: AC
  Administered 2017-06-19: 25 mg via INTRAVENOUS
  Filled 2017-06-19: qty 1

## 2017-06-19 MED ORDER — SODIUM CHLORIDE 0.9 % IV SOLN: 10.0000 mg | Freq: Once | INTRAVENOUS | Status: DC

## 2017-06-19 MED ORDER — PALONOSETRON HCL INJECTION 0.25 MG/5ML
0.2500 mg | Freq: Once | INTRAVENOUS | Status: AC
  Administered 2017-06-19: 0.25 mg via INTRAVENOUS
  Filled 2017-06-19: qty 5

## 2017-06-19 MED ORDER — DEXAMETHASONE SODIUM PHOSPHATE 10 MG/ML IJ SOLN
10.0000 mg | Freq: Once | INTRAMUSCULAR | Status: AC
  Administered 2017-06-19: 10 mg via INTRAVENOUS
  Filled 2017-06-19: qty 1

## 2017-06-19 NOTE — Progress Notes (Signed)
Patient here for pre treatment check. He reports that he has developed an upper respiratory infection. The cough is persistent and productive.

## 2017-06-20 ENCOUNTER — Ambulatory Visit
Admission: RE | Admit: 2017-06-20 | Discharge: 2017-06-20 | Disposition: A | Discharge status: Home / Self Care | Payer: BLUE CROSS/BLUE SHIELD | Source: Ambulatory Visit | Attending: Radiation Oncology | Admitting: Radiation Oncology

## 2017-06-20 DIAGNOSIS — C3492 Malignant neoplasm of unspecified part of left bronchus or lung: Secondary | ICD-10-CM | POA: Diagnosis not present

## 2017-06-23 ENCOUNTER — Ambulatory Visit
Admission: RE | Admit: 2017-06-23 | Discharge: 2017-06-23 | Disposition: A | Discharge status: Home / Self Care | Payer: BLUE CROSS/BLUE SHIELD | Source: Ambulatory Visit | Attending: Radiation Oncology | Admitting: Radiation Oncology

## 2017-06-23 DIAGNOSIS — C3492 Malignant neoplasm of unspecified part of left bronchus or lung: Secondary | ICD-10-CM | POA: Diagnosis not present

## 2017-06-24 ENCOUNTER — Ambulatory Visit
Admission: RE | Admit: 2017-06-24 | Discharge: 2017-06-24 | Disposition: A | Discharge status: Home / Self Care | Payer: BLUE CROSS/BLUE SHIELD | Source: Ambulatory Visit | Attending: Radiation Oncology | Admitting: Radiation Oncology

## 2017-06-24 DIAGNOSIS — C3492 Malignant neoplasm of unspecified part of left bronchus or lung: Secondary | ICD-10-CM | POA: Diagnosis not present

## 2017-06-25 ENCOUNTER — Ambulatory Visit
Admission: RE | Admit: 2017-06-25 | Discharge: 2017-06-25 | Disposition: A | Discharge status: Home / Self Care | Payer: BLUE CROSS/BLUE SHIELD | Source: Ambulatory Visit | Attending: Radiation Oncology | Admitting: Radiation Oncology

## 2017-06-25 DIAGNOSIS — C3492 Malignant neoplasm of unspecified part of left bronchus or lung: Secondary | ICD-10-CM | POA: Diagnosis not present

## 2017-06-25 LAB — ACID FAST CULTURE WITH REFLEXED SENSITIVITIES (MYCOBACTERIA): Acid Fast Culture: NEGATIVE

## 2017-06-25 NOTE — Progress Notes (Signed)
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  INTERVAL HISTORY: Patient returns to clinic today for further evaluation and consideration of cycle 4 of weekly carboplatinum and Taxol.  He currently feels well and back to his baseline.  His congestion and cough have resolved.  He denies any fevers.  He has no neurologic complaints. He has a good appetite and denies weight loss.  He denies any chest pain, shortness of breath, or hemoptysis.  He has no nausea, vomiting, constipation, or diarrhea.  He has no urinary complaints.  Patient offers no further specific complaints today.  REVIEW OF SYSTEMS:   Review of Systems  Constitutional: Negative.  Negative for fever, malaise/fatigue and weight loss.  HENT: Negative for congestion.   Respiratory: Negative.  Negative for cough, hemoptysis and shortness of breath.   Cardiovascular: Negative.  Negative for chest pain and leg swelling.  Gastrointestinal: Negative for abdominal pain, heartburn, nausea and vomiting.  Genitourinary: Negative.   Musculoskeletal: Positive for joint pain.  Skin: Negative.  Negative for rash.  Neurological: Negative.  Negative for sensory change and weakness.  Psychiatric/Behavioral: Negative.  The patient is not nervous/anxious.     As per HPI. Otherwise, a complete review of systems is negative.  PAST MEDICAL HISTORY: Past Medical History:  Diagnosis Date  . COPD (chronic obstructive pulmonary disease) ([place])   . Diabetes mellitus without complication ([place])   . Hypertension   . Pneumonia     PAST SURGICAL HISTORY: Past Surgical History:  Procedure Laterality Date  . ENDOBRONCHIAL ULTRASOUND N/A 05/12/2017   Procedure:  ENDOBRONCHIAL ULTRASOUND;  Surgeon: Laverle Hobby, MD;  Location: [facility] ORS;  Service: Pulmonary;  Laterality: N/A;  . FRACTURE SURGERY Left    ANKLE X 2  . TONSILLECTOMY      FAMILY HISTORY: Family History  Problem Relation Age of Onset  . Stroke Mother   . Diabetes Mother   . Hypertension Mother   . Diabetes Father   . Hypertension Father   . Diabetes Sister   . Diabetes Brother   . Heart attack Paternal Uncle   . Stroke Maternal Grandmother     ADVANCED DIRECTIVES (Y/N):  N  HEALTH MAINTENANCE: Social History   Tobacco Use  . Smoking status: Current Every Day Smoker    Packs/day: 1.50  . Smokeless tobacco: Never Used  Substance Use Topics  . Alcohol use: No  . Drug use: Not on file     Colonoscopy:  PAP:  Bone density:  Lipid panel:  Allergies  Allergen Reactions  . Shrimp [Shellfish Allergy] Nausea And Vomiting    Current Outpatient Medications  Medication Sig Dispense Refill  . albuterol (PROVENTIL HFA;VENTOLIN HFA) 108 (90 BASE) MCG/ACT inhaler Inhale 2 puffs into the lungs every 6 (six) hours as needed for wheezing or shortness of breath.    Marland Kitchen albuterol (PROVENTIL) (2.5 MG/3ML) 0.083% nebulizer solution Take 2.5 mg every 6 (six) hours as needed by nebulization for wheezing or shortness of breath.    . beclomethasone (QVAR) 80 MCG/ACT inhaler Inhale 1 puff into the lungs 2 (two) times daily.    Marland Kitchen glipiZIDE (GLUCOTROL) 5 MG tablet Take 5 mg by mouth daily before breakfast.    . ibuprofen (ADVIL,MOTRIN) 200 MG tablet Take  400-800 mg every 8 (eight) hours as needed by mouth (for pain.).    Marland Kitchen lisinopril-hydrochlorothiazide (PRINZIDE,ZESTORETIC) 20-25 MG tablet Take 1 tablet by mouth daily.    . metFORMIN (GLUCOPHAGE) 1000 MG tablet Take 1,000 mg by mouth 2 (two) times daily.    Marland Kitchen omeprazole (PRILOSEC) 20 MG capsule Take 1 capsule (20 mg total) by mouth daily. 30 capsule 2  . ondansetron (ZOFRAN) 8 MG tablet Take 1 tablet (8 mg total) by mouth 2 (two)  times daily as needed for refractory nausea / vomiting. 30 tablet 2  . prochlorperazine (COMPAZINE) 10 MG tablet Take 1 tablet (10 mg total) by mouth every 6 (six) hours as needed (Nausea or vomiting). 60 tablet 2  . traMADol (ULTRAM) 50 MG tablet Take 1 tablet (50 mg total) by mouth every 6 (six) hours as needed. 30 tablet 0   No current facility-administered medications for this visit.     OBJECTIVE: Vitals:   06/26/17 0925  BP: (!) 156/97  Pulse: 84  Temp: (!) 97.1 F (36.2 C)     Body mass index is 32.04 kg/m.    ECOG FS:0 - Asymptomatic  General: Well-developed, well-nourished, no acute distress. Eyes: Pink conjunctiva, anicteric sclera. Lungs: Clear to auscultation bilaterally. Heart: Regular rate and rhythm. No rubs, murmurs, or gallops. Abdomen: Soft, nontender, nondistended. No organomegaly noted, normoactive bowel sounds. Musculoskeletal: No edema, cyanosis, or clubbing. Neuro: Alert, answering all questions appropriately. Cranial nerves grossly intact. Skin: No rashes or petechiae noted. Psych: Normal affect.  LAB RESULTS:  Lab Results  Component Value Date   NA 135 06/26/2017   K 4.1 06/26/2017   CL 98 (L) 06/26/2017   CO2 27 06/26/2017   GLUCOSE 149 (H) 06/26/2017   BUN 11 06/26/2017   CREATININE 0.71 06/26/2017   CALCIUM 9.0 06/26/2017   PROT 7.0 06/26/2017   ALBUMIN 3.9 06/26/2017   AST 19 06/26/2017   ALT 18 06/26/2017   ALKPHOS 100 06/26/2017   BILITOT 0.5 06/26/2017   GFRNONAA >60 06/26/2017   GFRAA >60 06/26/2017    Lab Results  Component Value Date   WBC 7.2 06/26/2017   NEUTROABS 5.5 06/26/2017   HGB 14.3 06/26/2017   HCT 43.0 06/26/2017   MCV 87.6 06/26/2017   PLT 290 06/26/2017     STUDIES: No results found.  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  PLAN:    1. 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  Patient was also discussed at cancer  conference.  MRI of the brain is negative. PET scan results from May 27, 2017 reviewed independently with hypermetabolic right hilar mass as well as subcarinal and ipsilateral nodal metastasis.  No evidence of metastatic disease.  Patient will benefit from concurrent chemotherapy using carboplatinum and Taxol weekly along with daily XRT.  Given the stage of his disease, he will also benefit from maintenance immunotherapy with Imfinzi at the conclusion of his chemotherapy and XRT.  Patient has declined port placement at this time, but will reconsider in the future.  Proceed with cycle 4 of weekly carboplatinum and Taxol today.  Continue daily XRT which will be completed on July 30, 2017.  Return to clinic in 1 week for further evaluation and consideration of cycle 5. 2.  Hyperglycemia: Monitor.  Patient is receiving dexamethasone as a premedication.  3.  Indigestion: Continue omeprazole as needed. 4.  Pain: Continue tramadol as needed. 5.  Cough/congestion: Resolved. 6.  Hypertension: Patient's blood pressure is mildly elevated today.  Monitor.  Patient expressed understanding and was in agreement with this plan. He also understands that He can call clinic at any time with any questions, concerns, or complaints.   Cancer Staging Squamous cell lung cancer, left [facility]) Staging form: Lung, AJCC 8th Edition - Clinical stage from 05/16/2017: Stage IIIB (cT3, cN2, cM0) - Signed by Lloyd Huger, MD on 05/16/2017   Lloyd Huger, MD   06/29/2017 9:33 AM

## 2017-06-26 ENCOUNTER — Ambulatory Visit
Admission: RE | Admit: 2017-06-26 | Discharge: 2017-06-26 | Disposition: A | Discharge status: Home / Self Care | Payer: BLUE CROSS/BLUE SHIELD | Source: Ambulatory Visit | Attending: Radiation Oncology | Admitting: Radiation Oncology

## 2017-06-26 ENCOUNTER — Inpatient Hospital Stay (HOSPITAL_BASED_OUTPATIENT_CLINIC_OR_DEPARTMENT_OTHER): Payer: BLUE CROSS/BLUE SHIELD | Admitting: Oncology

## 2017-06-26 ENCOUNTER — Inpatient Hospital Stay: Payer: BLUE CROSS/BLUE SHIELD

## 2017-06-26 ENCOUNTER — Encounter: Payer: Self-pay | Admitting: Oncology

## 2017-06-26 ENCOUNTER — Other Ambulatory Visit: Payer: Self-pay

## 2017-06-26 VITALS — BP 156/97 | HR 84 | Temp 97.1°F | Wt 213.8 lb

## 2017-06-26 DIAGNOSIS — I10 Essential (primary) hypertension: Secondary | ICD-10-CM

## 2017-06-26 DIAGNOSIS — R918 Other nonspecific abnormal finding of lung field: Secondary | ICD-10-CM | POA: Diagnosis not present

## 2017-06-26 DIAGNOSIS — J449 Chronic obstructive pulmonary disease, unspecified: Secondary | ICD-10-CM | POA: Diagnosis not present

## 2017-06-26 DIAGNOSIS — E1165 Type 2 diabetes mellitus with hyperglycemia: Secondary | ICD-10-CM | POA: Diagnosis not present

## 2017-06-26 DIAGNOSIS — K30 Functional dyspepsia: Secondary | ICD-10-CM

## 2017-06-26 DIAGNOSIS — F17210 Nicotine dependence, cigarettes, uncomplicated: Secondary | ICD-10-CM

## 2017-06-26 DIAGNOSIS — C3492 Malignant neoplasm of unspecified part of left bronchus or lung: Secondary | ICD-10-CM

## 2017-06-26 DIAGNOSIS — Z8701 Personal history of pneumonia (recurrent): Secondary | ICD-10-CM | POA: Diagnosis not present

## 2017-06-26 DIAGNOSIS — Z7984 Long term (current) use of oral hypoglycemic drugs: Secondary | ICD-10-CM | POA: Diagnosis not present

## 2017-06-26 DIAGNOSIS — Z8673 Personal history of transient ischemic attack (TIA), and cerebral infarction without residual deficits: Secondary | ICD-10-CM

## 2017-06-26 DIAGNOSIS — Z79899 Other long term (current) drug therapy: Secondary | ICD-10-CM | POA: Diagnosis not present

## 2017-06-26 DIAGNOSIS — D3502 Benign neoplasm of left adrenal gland: Secondary | ICD-10-CM | POA: Diagnosis not present

## 2017-06-26 DIAGNOSIS — J3489 Other specified disorders of nose and nasal sinuses: Secondary | ICD-10-CM

## 2017-06-26 LAB — COMPREHENSIVE METABOLIC PANEL
ALBUMIN: 3.9 g/dL (ref 3.5–5.0)
ALT: 18 U/L (ref 17–63)
AST: 19 U/L (ref 15–41)
Alkaline Phosphatase: 100 U/L (ref 38–126)
Anion gap: 10 (ref 5–15)
BUN: 11 mg/dL (ref 6–20)
CHLORIDE: 98 mmol/L — AB (ref 101–111)
CO2: 27 mmol/L (ref 22–32)
Calcium: 9 mg/dL (ref 8.9–10.3)
Creatinine, Ser: 0.71 mg/dL (ref 0.61–1.24)
GFR calc Af Amer: >60 mL/min (ref >60)
GFR calc non Af Amer: >60 mL/min (ref >60)
Glucose, Bld: 149 mg/dL — ABNORMAL HIGH (ref 65–99)
POTASSIUM: 4.1 mmol/L (ref 3.5–5.1)
Sodium: 135 mmol/L (ref 135–145)
Total Bilirubin: 0.5 mg/dL (ref 0.3–1.2)
Total Protein: 7 g/dL (ref 6.5–8.1)

## 2017-06-26 LAB — CBC WITH DIFFERENTIAL/PLATELET
Basophils Absolute: 0.1 10*3/uL (ref 0–0.1)
Basophils Relative: 1 %
EOS PCT: 1 %
Eosinophils Absolute: 0.1 10*3/uL (ref 0–0.7)
HCT: 43 % (ref 40.0–52.0)
Hemoglobin: 14.3 g/dL (ref 13.0–18.0)
LYMPHS ABS: 0.9 10*3/uL — AB (ref 1.0–3.6)
LYMPHS PCT: 13 %
MCH: 29.2 pg (ref 26.0–34.0)
MCHC: 33.3 g/dL (ref 32.0–36.0)
MCV: 87.6 fL (ref 80.0–100.0)
MONO ABS: 0.6 10*3/uL (ref 0.2–1.0)
MONOS PCT: 9 %
Neutro Abs: 5.5 10*3/uL (ref 1.4–6.5)
Neutrophils Relative %: 76 %
PLATELETS: 290 10*3/uL (ref 150–440)
RBC: 4.9 MIL/uL (ref 4.40–5.90)
RDW: 14.2 % (ref 11.5–14.5)
WBC: 7.2 10*3/uL (ref 3.8–10.6)

## 2017-06-26 MED ORDER — FAMOTIDINE IN NACL 20-0.9 MG/50ML-% IV SOLN
20.0000 mg | Freq: Once | INTRAVENOUS | Status: AC
  Administered 2017-06-26: 20 mg via INTRAVENOUS

## 2017-06-26 MED ORDER — DIPHENHYDRAMINE HCL 50 MG/ML IJ SOLN
25.0000 mg | Freq: Once | INTRAMUSCULAR | Status: AC
  Administered 2017-06-26: 25 mg via INTRAVENOUS
  Filled 2017-06-26: qty 1

## 2017-06-26 MED ORDER — SODIUM CHLORIDE 0.9 % IV SOLN
Freq: Once | INTRAVENOUS | Status: AC
  Administered 2017-06-26: 11:00:00 via INTRAVENOUS
  Filled 2017-06-26: qty 1000

## 2017-06-26 MED ORDER — PALONOSETRON HCL INJECTION 0.25 MG/5ML
0.2500 mg | Freq: Once | INTRAVENOUS | Status: AC
  Administered 2017-06-26: 0.25 mg via INTRAVENOUS
  Filled 2017-06-26: qty 5

## 2017-06-26 MED ORDER — SODIUM CHLORIDE 0.9 % IV SOLN
45.0000 mg/m2 | Freq: Once | INTRAVENOUS | Status: AC
  Administered 2017-06-26: 96 mg via INTRAVENOUS
  Filled 2017-06-26: qty 16

## 2017-06-26 MED ORDER — DEXAMETHASONE SODIUM PHOSPHATE 10 MG/ML IJ SOLN
10.0000 mg | Freq: Once | INTRAMUSCULAR | Status: AC
  Administered 2017-06-26: 10 mg via INTRAVENOUS
  Filled 2017-06-26: qty 1

## 2017-06-26 MED ORDER — SODIUM CHLORIDE 0.9 % IV SOLN
300.0000 mg | Freq: Once | INTRAVENOUS | Status: AC
  Administered 2017-06-26: 300 mg via INTRAVENOUS
  Filled 2017-06-26: qty 30

## 2017-06-27 ENCOUNTER — Ambulatory Visit
Admission: RE | Admit: 2017-06-27 | Discharge: 2017-06-27 | Disposition: A | Discharge status: Home / Self Care | Payer: BLUE CROSS/BLUE SHIELD | Source: Ambulatory Visit | Attending: Radiation Oncology | Admitting: Radiation Oncology

## 2017-06-27 DIAGNOSIS — C3492 Malignant neoplasm of unspecified part of left bronchus or lung: Secondary | ICD-10-CM | POA: Diagnosis not present

## 2017-06-29 NOTE — Progress Notes (Signed)
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  INTERVAL HISTORY: Patient returns to clinic today for further evaluation and consideration of cycle 5 of weekly carboplatinum and Taxol.  He continues to tolerate his treatment well without significant side effects. He denies any fevers.  He has no neurologic complaints. He has a good appetite and denies weight loss.  He denies any chest pain, shortness of breath, or hemoptysis.  He has no nausea, vomiting, constipation, or diarrhea.  He has no urinary complaints.  Patient offers no specific complaints today.  REVIEW OF SYSTEMS:   Review of Systems  Constitutional: Negative.  Negative for fever, malaise/fatigue and weight loss.  HENT: Negative for congestion.   Respiratory: Negative.  Negative for cough, hemoptysis and shortness of breath.   Cardiovascular: Negative.  Negative for chest pain and leg swelling.  Gastrointestinal: Negative for abdominal pain, heartburn, nausea and vomiting.  Genitourinary: Negative.   Musculoskeletal: Positive for joint pain.  Skin: Negative.  Negative for rash.  Neurological: Negative.  Negative for sensory change and weakness.  Psychiatric/Behavioral: Negative.  The patient is not nervous/anxious.     As per HPI. Otherwise, a complete review of systems is negative.  PAST MEDICAL HISTORY: Past Medical History:  Diagnosis Date  . COPD (chronic obstructive pulmonary disease) ([place])   . Diabetes mellitus without complication ([place])   . Hypertension   . Pneumonia     PAST SURGICAL HISTORY: Past Surgical History:  Procedure Laterality Date  . ENDOBRONCHIAL ULTRASOUND N/A 05/12/2017   Procedure: ENDOBRONCHIAL  ULTRASOUND;  Surgeon: Laverle Hobby, MD;  Location: [facility] ORS;  Service: Pulmonary;  Laterality: N/A;  . FRACTURE SURGERY Left    ANKLE X 2  . TONSILLECTOMY      FAMILY HISTORY: Family History  Problem Relation Age of Onset  . Stroke Mother   . Diabetes Mother   . Hypertension Mother   . Diabetes Father   . Hypertension Father   . Diabetes Sister   . Diabetes Brother   . Heart attack Paternal Uncle   . Stroke Maternal Grandmother     ADVANCED DIRECTIVES (Y/N):  N  HEALTH MAINTENANCE: Social History   Tobacco Use  . Smoking status: Current Every Day Smoker    Packs/day: 1.50  . Smokeless tobacco: Never Used  Substance Use Topics  . Alcohol use: No  . Drug use: Not on file     Colonoscopy:  PAP:  Bone density:  Lipid panel:  Allergies  Allergen Reactions  . Shrimp [Shellfish Allergy] Nausea And Vomiting    Current Outpatient Medications  Medication Sig Dispense Refill  . albuterol (PROVENTIL HFA;VENTOLIN HFA) 108 (90 BASE) MCG/ACT inhaler Inhale 2 puffs into the lungs every 6 (six) hours as needed for wheezing or shortness of breath.    Marland Kitchen albuterol (PROVENTIL) (2.5 MG/3ML) 0.083% nebulizer solution Take 2.5 mg every 6 (six) hours as needed by nebulization for wheezing or shortness of breath.    . beclomethasone (QVAR) 80 MCG/ACT inhaler Inhale 1 puff into the lungs 2 (two) times daily.    Marland Kitchen glipiZIDE (GLUCOTROL) 5 MG tablet Take 5 mg by mouth daily before breakfast.    . ibuprofen (ADVIL,MOTRIN) 200 MG tablet Take 400-800 mg every 8 (eight) hours as  needed by mouth (for pain.).    Marland Kitchen lisinopril-hydrochlorothiazide (PRINZIDE,ZESTORETIC) 20-25 MG tablet Take 1 tablet by mouth daily.    . metFORMIN (GLUCOPHAGE) 1000 MG tablet Take 1,000 mg by mouth 2 (two) times daily.    Marland Kitchen omeprazole (PRILOSEC) 20 MG capsule Take 1 capsule (20 mg total) by mouth daily. 30 capsule 2  . ondansetron (ZOFRAN) 8 MG tablet Take 1 tablet (8 mg total) by mouth 2 (two) times daily as  needed for refractory nausea / vomiting. 30 tablet 2  . prochlorperazine (COMPAZINE) 10 MG tablet Take 1 tablet (10 mg total) by mouth every 6 (six) hours as needed (Nausea or vomiting). 60 tablet 2  . sucralfate (CARAFATE) 1 g tablet Take 1 tablet (1 g total) by mouth 3 (three) times daily. Dissolve in 2-3 tbsp warm water, swish and swallow. 90 tablet 3  . traMADol (ULTRAM) 50 MG tablet Take 1 tablet (50 mg total) by mouth every 6 (six) hours as needed. 30 tablet 0   No current facility-administered medications for this visit.     OBJECTIVE: Vitals:   07/03/17 0938  BP: (!) 155/101  Pulse: 84  Resp: 18  Temp: 98 F (36.7 C)     Body mass index is 32.72 kg/m.    ECOG FS:0 - Asymptomatic  General: Well-developed, well-nourished, no acute distress. Eyes: Pink conjunctiva, anicteric sclera. Lungs: Clear to auscultation bilaterally. Heart: Regular rate and rhythm. No rubs, murmurs, or gallops. Abdomen: Soft, nontender, nondistended. No organomegaly noted, normoactive bowel sounds. Musculoskeletal: No edema, cyanosis, or clubbing. Neuro: Alert, answering all questions appropriately. Cranial nerves grossly intact. Skin: No rashes or petechiae noted. Psych: Normal affect.  LAB RESULTS:  Lab Results  Component Value Date   NA 135 07/03/2017   K 4.0 07/03/2017   CL 99 (L) 07/03/2017   CO2 27 07/03/2017   GLUCOSE 144 (H) 07/03/2017   BUN 18 07/03/2017   CREATININE 0.80 07/03/2017   CALCIUM 8.7 (L) 07/03/2017   PROT 6.5 07/03/2017   ALBUMIN 3.7 07/03/2017   AST 19 07/03/2017   ALT 19 07/03/2017   ALKPHOS 101 07/03/2017   BILITOT 0.3 07/03/2017   GFRNONAA >60 07/03/2017   GFRAA >60 07/03/2017    Lab Results  Component Value Date   WBC 5.6 07/03/2017   NEUTROABS 4.3 07/03/2017   HGB 14.1 07/03/2017   HCT 42.1 07/03/2017   MCV 88.5 07/03/2017   PLT 164 07/03/2017     STUDIES: No results found.  ASSESSMENT: Stage IIIb squamous cell carcinoma of the left  lung.  PLAN:    1. 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  Patient was also discussed at cancer conference.  MRI of the brain is negative. PET scan results from May 27, 2017 reviewed independently with hypermetabolic right hilar mass as well as subcarinal and ipsilateral nodal metastasis.  No evidence of metastatic disease.  Patient will benefit from concurrent chemotherapy using carboplatinum and Taxol weekly along with daily XRT.  Given the stage of his disease, he will also benefit from maintenance immunotherapy with Imfinzi every 2 weeks for 12 months at the conclusion of his chemotherapy and XRT.  Patient has declined port placement at this time, but will reconsider in the future.  Proceed with cycle 5 of weekly carboplatinum and Taxol today.  Continue daily XRT which will be completed on July 30, 2017.  Return to clinic in 1 week for further evaluation and consideration of cycle 6.  2.  Hyperglycemia: Monitor.  Patient is receiving dexamethasone as a premedication.  3.  Indigestion: Continue omeprazole as needed. 4.  Pain: Continue tramadol as needed. 5.  Cough/congestion: Resolved. 6.  Hypertension: Patient's blood pressure is mildly elevated today.  Continue current treatment as prescribed.  Patient expressed understanding and was in agreement with this plan. He also understands that He can call clinic at any time with any questions, concerns, or complaints.   Cancer Staging Squamous cell lung cancer, left [facility]) Staging form: Lung, AJCC 8th Edition - Clinical stage from 05/16/2017: Stage IIIB (cT3, cN2, cM0) - Signed by Lloyd Huger, MD on 05/16/2017   Lloyd Huger, MD   07/06/2017 9:02 AM

## 2017-06-30 ENCOUNTER — Ambulatory Visit
Admission: RE | Admit: 2017-06-30 | Discharge: 2017-06-30 | Disposition: A | Discharge status: Home / Self Care | Payer: BLUE CROSS/BLUE SHIELD | Source: Ambulatory Visit | Attending: Radiation Oncology | Admitting: Radiation Oncology

## 2017-06-30 DIAGNOSIS — C3492 Malignant neoplasm of unspecified part of left bronchus or lung: Secondary | ICD-10-CM | POA: Diagnosis not present

## 2017-07-01 ENCOUNTER — Ambulatory Visit
Admission: RE | Admit: 2017-07-01 | Discharge: 2017-07-01 | Disposition: A | Discharge status: Home / Self Care | Payer: BLUE CROSS/BLUE SHIELD | Source: Ambulatory Visit | Attending: Radiation Oncology | Admitting: Radiation Oncology

## 2017-07-01 ENCOUNTER — Other Ambulatory Visit: Payer: Self-pay | Admitting: *Deleted

## 2017-07-01 DIAGNOSIS — C3492 Malignant neoplasm of unspecified part of left bronchus or lung: Secondary | ICD-10-CM | POA: Diagnosis not present

## 2017-07-01 MED ORDER — SUCRALFATE 1 G PO TABS: 1.0000 g | ORAL_TABLET | Freq: Three times a day (TID) | ORAL | 3 refills | Status: DC

## 2017-07-02 ENCOUNTER — Ambulatory Visit
Admission: RE | Admit: 2017-07-02 | Discharge: 2017-07-02 | Disposition: A | Discharge status: Home / Self Care | Payer: BLUE CROSS/BLUE SHIELD | Source: Ambulatory Visit | Attending: Radiation Oncology | Admitting: Radiation Oncology

## 2017-07-02 DIAGNOSIS — C3492 Malignant neoplasm of unspecified part of left bronchus or lung: Secondary | ICD-10-CM | POA: Diagnosis not present

## 2017-07-03 ENCOUNTER — Inpatient Hospital Stay: Payer: BLUE CROSS/BLUE SHIELD

## 2017-07-03 ENCOUNTER — Inpatient Hospital Stay (HOSPITAL_BASED_OUTPATIENT_CLINIC_OR_DEPARTMENT_OTHER): Payer: BLUE CROSS/BLUE SHIELD | Admitting: Oncology

## 2017-07-03 ENCOUNTER — Ambulatory Visit
Admission: RE | Admit: 2017-07-03 | Discharge: 2017-07-03 | Disposition: A | Discharge status: Home / Self Care | Payer: BLUE CROSS/BLUE SHIELD | Source: Ambulatory Visit | Attending: Radiation Oncology | Admitting: Radiation Oncology

## 2017-07-03 VITALS — BP 155/101 | HR 84 | Temp 98.0°F | Resp 18 | Wt 218.4 lb

## 2017-07-03 DIAGNOSIS — C3492 Malignant neoplasm of unspecified part of left bronchus or lung: Secondary | ICD-10-CM

## 2017-07-03 DIAGNOSIS — R918 Other nonspecific abnormal finding of lung field: Secondary | ICD-10-CM

## 2017-07-03 DIAGNOSIS — Z7984 Long term (current) use of oral hypoglycemic drugs: Secondary | ICD-10-CM

## 2017-07-03 DIAGNOSIS — D3502 Benign neoplasm of left adrenal gland: Secondary | ICD-10-CM | POA: Diagnosis not present

## 2017-07-03 DIAGNOSIS — F17210 Nicotine dependence, cigarettes, uncomplicated: Secondary | ICD-10-CM

## 2017-07-03 DIAGNOSIS — J449 Chronic obstructive pulmonary disease, unspecified: Secondary | ICD-10-CM | POA: Diagnosis not present

## 2017-07-03 DIAGNOSIS — I10 Essential (primary) hypertension: Secondary | ICD-10-CM

## 2017-07-03 DIAGNOSIS — E1165 Type 2 diabetes mellitus with hyperglycemia: Secondary | ICD-10-CM | POA: Diagnosis not present

## 2017-07-03 DIAGNOSIS — K30 Functional dyspepsia: Secondary | ICD-10-CM | POA: Diagnosis not present

## 2017-07-03 DIAGNOSIS — J3489 Other specified disorders of nose and nasal sinuses: Secondary | ICD-10-CM | POA: Diagnosis not present

## 2017-07-03 DIAGNOSIS — Z8673 Personal history of transient ischemic attack (TIA), and cerebral infarction without residual deficits: Secondary | ICD-10-CM

## 2017-07-03 DIAGNOSIS — Z5111 Encounter for antineoplastic chemotherapy: Secondary | ICD-10-CM | POA: Diagnosis not present

## 2017-07-03 DIAGNOSIS — Z8701 Personal history of pneumonia (recurrent): Secondary | ICD-10-CM

## 2017-07-03 DIAGNOSIS — Z79899 Other long term (current) drug therapy: Secondary | ICD-10-CM

## 2017-07-03 LAB — CBC WITH DIFFERENTIAL/PLATELET
BASOS PCT: 1 %
Basophils Absolute: 0.1 10*3/uL (ref 0–0.1)
Eosinophils Absolute: 0.1 10*3/uL (ref 0–0.7)
Eosinophils Relative: 2 %
HEMATOCRIT: 42.1 % (ref 40.0–52.0)
HEMOGLOBIN: 14.1 g/dL (ref 13.0–18.0)
LYMPHS ABS: 0.7 10*3/uL — AB (ref 1.0–3.6)
Lymphocytes Relative: 12 %
MCH: 29.6 pg (ref 26.0–34.0)
MCHC: 33.5 g/dL (ref 32.0–36.0)
MCV: 88.5 fL (ref 80.0–100.0)
MONO ABS: 0.4 10*3/uL (ref 0.2–1.0)
MONOS PCT: 8 %
NEUTROS ABS: 4.3 10*3/uL (ref 1.4–6.5)
Neutrophils Relative %: 77 %
Platelets: 164 10*3/uL (ref 150–440)
RBC: 4.75 MIL/uL (ref 4.40–5.90)
RDW: 14.2 % (ref 11.5–14.5)
WBC: 5.6 10*3/uL (ref 3.8–10.6)

## 2017-07-03 LAB — COMPREHENSIVE METABOLIC PANEL
ALT: 19 U/L (ref 17–63)
AST: 19 U/L (ref 15–41)
Albumin: 3.7 g/dL (ref 3.5–5.0)
Alkaline Phosphatase: 101 U/L (ref 38–126)
Anion gap: 9 (ref 5–15)
BILIRUBIN TOTAL: 0.3 mg/dL (ref 0.3–1.2)
BUN: 18 mg/dL (ref 6–20)
CO2: 27 mmol/L (ref 22–32)
Calcium: 8.7 mg/dL — ABNORMAL LOW (ref 8.9–10.3)
Chloride: 99 mmol/L — ABNORMAL LOW (ref 101–111)
Creatinine, Ser: 0.8 mg/dL (ref 0.61–1.24)
Glucose, Bld: 144 mg/dL — ABNORMAL HIGH (ref 65–99)
POTASSIUM: 4 mmol/L (ref 3.5–5.1)
Sodium: 135 mmol/L (ref 135–145)
TOTAL PROTEIN: 6.5 g/dL (ref 6.5–8.1)

## 2017-07-03 MED ORDER — PALONOSETRON HCL INJECTION 0.25 MG/5ML
0.2500 mg | Freq: Once | INTRAVENOUS | Status: AC
  Administered 2017-07-03: 0.25 mg via INTRAVENOUS
  Filled 2017-07-03: qty 5

## 2017-07-03 MED ORDER — SODIUM CHLORIDE 0.9 % IV SOLN: 10.0000 mg | Freq: Once | INTRAVENOUS | Status: DC

## 2017-07-03 MED ORDER — DEXAMETHASONE SODIUM PHOSPHATE 10 MG/ML IJ SOLN
10.0000 mg | Freq: Once | INTRAMUSCULAR | Status: AC
  Administered 2017-07-03: 10 mg via INTRAVENOUS
  Filled 2017-07-03: qty 1

## 2017-07-03 MED ORDER — FAMOTIDINE IN NACL 20-0.9 MG/50ML-% IV SOLN
20.0000 mg | Freq: Once | INTRAVENOUS | Status: AC
  Administered 2017-07-03: 20 mg via INTRAVENOUS
  Filled 2017-07-03: qty 50

## 2017-07-03 MED ORDER — DIPHENHYDRAMINE HCL 50 MG/ML IJ SOLN
25.0000 mg | Freq: Once | INTRAMUSCULAR | Status: AC
  Administered 2017-07-03: 25 mg via INTRAVENOUS
  Filled 2017-07-03: qty 1

## 2017-07-03 MED ORDER — SODIUM CHLORIDE 0.9 % IV SOLN
Freq: Once | INTRAVENOUS | Status: AC
  Administered 2017-07-03: 10:00:00 via INTRAVENOUS
  Filled 2017-07-03: qty 1000

## 2017-07-03 MED ORDER — SODIUM CHLORIDE 0.9 % IV SOLN
45.0000 mg/m2 | Freq: Once | INTRAVENOUS | Status: AC
  Administered 2017-07-03: 96 mg via INTRAVENOUS
  Filled 2017-07-03: qty 16

## 2017-07-03 MED ORDER — CARBOPLATIN CHEMO INJECTION 450 MG/45ML
300.0000 mg | Freq: Once | INTRAVENOUS | Status: AC
  Administered 2017-07-03: 300 mg via INTRAVENOUS
  Filled 2017-07-03: qty 30

## 2017-07-04 ENCOUNTER — Ambulatory Visit
Admission: RE | Admit: 2017-07-04 | Discharge: 2017-07-04 | Disposition: A | Discharge status: Home / Self Care | Payer: BLUE CROSS/BLUE SHIELD | Source: Ambulatory Visit | Attending: Radiation Oncology | Admitting: Radiation Oncology

## 2017-07-04 DIAGNOSIS — C3492 Malignant neoplasm of unspecified part of left bronchus or lung: Secondary | ICD-10-CM | POA: Diagnosis not present

## 2017-07-07 ENCOUNTER — Ambulatory Visit
Admission: RE | Admit: 2017-07-07 | Discharge: 2017-07-07 | Disposition: A | Discharge status: Home / Self Care | Payer: BLUE CROSS/BLUE SHIELD | Source: Ambulatory Visit | Attending: Radiation Oncology | Admitting: Radiation Oncology

## 2017-07-07 DIAGNOSIS — C3492 Malignant neoplasm of unspecified part of left bronchus or lung: Secondary | ICD-10-CM | POA: Diagnosis not present

## 2017-07-07 NOTE — Progress Notes (Signed)
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  INTERVAL HISTORY: Patient returns to clinic today for further evaluation and consideration of cycle 6 of weekly carboplatinum and Taxol.  He continues to tolerate his treatments well without significant side effects. He denies any fevers or recent illnesses.  He has no neurologic complaints. He has a good appetite and denies weight loss.  He denies any chest pain, shortness of breath, or hemoptysis.  He has an occasional cough.  He has no nausea, vomiting, constipation, or diarrhea.  He has no urinary complaints.  Patient offers no further specific complaints today.  REVIEW OF SYSTEMS:   Review of Systems  Constitutional: Negative.  Negative for fever, malaise/fatigue and weight loss.  HENT: Negative for congestion.   Respiratory: Positive for cough. Negative for hemoptysis and shortness of breath.   Cardiovascular: Negative.  Negative for chest pain and leg swelling.  Gastrointestinal: Negative for abdominal pain, heartburn, nausea and vomiting.  Genitourinary: Negative.   Musculoskeletal: Positive for joint pain.  Skin: Negative.  Negative for rash.  Neurological: Negative.  Negative for sensory change and weakness.  Psychiatric/Behavioral: Negative.  The patient is not nervous/anxious.     As per HPI. Otherwise, a complete review of systems is negative.  PAST MEDICAL HISTORY: Past Medical History:  Diagnosis Date  . COPD (chronic obstructive pulmonary disease) ([place])   . Diabetes mellitus without complication ([place])   . Hypertension   . Pneumonia     PAST SURGICAL HISTORY: Past Surgical History:  Procedure Laterality Date  . ENDOBRONCHIAL  ULTRASOUND N/A 05/12/2017   Procedure: ENDOBRONCHIAL ULTRASOUND;  Surgeon: Laverle Hobby, MD;  Location: [facility] ORS;  Service: Pulmonary;  Laterality: N/A;  . FRACTURE SURGERY Left    ANKLE X 2  . TONSILLECTOMY      FAMILY HISTORY: Family History  Problem Relation Age of Onset  . Stroke Mother   . Diabetes Mother   . Hypertension Mother   . Diabetes Father   . Hypertension Father   . Diabetes Sister   . Diabetes Brother   . Heart attack Paternal Uncle   . Stroke Maternal Grandmother     ADVANCED DIRECTIVES (Y/N):  N  HEALTH MAINTENANCE: Social History   Tobacco Use  . Smoking status: Current Every Day Smoker    Packs/day: 1.50  . Smokeless tobacco: Never Used  Substance Use Topics  . Alcohol use: No  . Drug use: Not on file     Colonoscopy:  PAP:  Bone density:  Lipid panel:  Allergies  Allergen Reactions  . Shrimp [Shellfish Allergy] Nausea And Vomiting    Current Outpatient Medications  Medication Sig Dispense Refill  . albuterol (PROVENTIL HFA;VENTOLIN HFA) 108 (90 BASE) MCG/ACT inhaler Inhale 2 puffs into the lungs every 6 (six) hours as needed for wheezing or shortness of breath.    Marland Kitchen albuterol (PROVENTIL) (2.5 MG/3ML) 0.083% nebulizer solution Take 2.5 mg every 6 (six) hours as needed by nebulization for wheezing or shortness of breath.    . beclomethasone (QVAR) 80 MCG/ACT inhaler Inhale 1 puff into the lungs 2 (two) times daily.    Marland Kitchen glipiZIDE (GLUCOTROL) 5 MG tablet Take 5 mg by mouth daily before breakfast.    . ibuprofen (ADVIL,MOTRIN) 200  MG tablet Take 400-800 mg every 8 (eight) hours as needed by mouth (for pain.).    Marland Kitchen lisinopril-hydrochlorothiazide (PRINZIDE,ZESTORETIC) 20-25 MG tablet Take 1 tablet by mouth daily.    . metFORMIN (GLUCOPHAGE) 1000 MG tablet Take 1,000 mg by mouth 2 (two) times daily.    Marland Kitchen omeprazole (PRILOSEC) 20 MG capsule Take 1 capsule (20 mg total) by mouth daily. 30 capsule 2  . ondansetron (ZOFRAN) 8 MG tablet Take 1  tablet (8 mg total) by mouth 2 (two) times daily as needed for refractory nausea / vomiting. 30 tablet 2  . prochlorperazine (COMPAZINE) 10 MG tablet Take 1 tablet (10 mg total) by mouth every 6 (six) hours as needed (Nausea or vomiting). 60 tablet 2  . sucralfate (CARAFATE) 1 g tablet Take 1 tablet (1 g total) by mouth 3 (three) times daily. Dissolve in 2-3 tbsp warm water, swish and swallow. 90 tablet 3  . traMADol (ULTRAM) 50 MG tablet Take 1 tablet (50 mg total) by mouth every 6 (six) hours as needed. 30 tablet 0   No current facility-administered medications for this visit.     OBJECTIVE: Vitals:   07/10/17 0920  BP: (!) 142/90  Pulse: 79  Resp: 20  Temp: (!) 97.5 F (36.4 C)     Body mass index is 32.6 kg/m.    ECOG FS:0 - Asymptomatic  General: Well-developed, well-nourished, no acute distress. Eyes: Pink conjunctiva, anicteric sclera. Lungs: Clear to auscultation bilaterally. Heart: Regular rate and rhythm. No rubs, murmurs, or gallops. Abdomen: Soft, nontender, nondistended. No organomegaly noted, normoactive bowel sounds. Musculoskeletal: No edema, cyanosis, or clubbing. Neuro: Alert, answering all questions appropriately. Cranial nerves grossly intact. Skin: No rashes or petechiae noted. Psych: Normal affect.  LAB RESULTS:  Lab Results  Component Value Date   NA 136 07/10/2017   K 4.0 07/10/2017   CL 102 07/10/2017   CO2 25 07/10/2017   GLUCOSE 163 (H) 07/10/2017   BUN 15 07/10/2017   CREATININE 0.69 07/10/2017   CALCIUM 8.9 07/10/2017   PROT 6.6 07/10/2017   ALBUMIN 3.7 07/10/2017   AST 20 07/10/2017   ALT 21 07/10/2017   ALKPHOS 92 07/10/2017   BILITOT 0.5 07/10/2017   GFRNONAA >60 07/10/2017   GFRAA >60 07/10/2017    Lab Results  Component Value Date   WBC 4.9 07/10/2017   NEUTROABS 3.8 07/10/2017   HGB 13.8 07/10/2017   HCT 40.8 07/10/2017   MCV 87.6 07/10/2017   PLT 134 (L) 07/10/2017     STUDIES: No results found.  ASSESSMENT: Stage  IIIb squamous cell carcinoma of the left lung.  PLAN:    1. 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  Patient was also discussed at cancer conference.  MRI of the brain is negative. PET scan results from May 27, 2017 reviewed independently with hypermetabolic right hilar mass as well as subcarinal and ipsilateral nodal metastasis.  No evidence of metastatic disease.  Patient will benefit from concurrent chemotherapy using carboplatinum and Taxol weekly along with daily XRT.  Given the stage of his disease, he will also benefit from maintenance immunotherapy with Imfinzi every 2 weeks for 12 months at the conclusion of his chemotherapy and XRT.  Patient has declined port placement at this time, but will reconsider in the future.  Proceed with cycle 6 of weekly carboplatinum and Taxol today.  Continue daily XRT which will be completed on July 30, 2017.  Return to clinic in  1 week for further evaluation and consideration of cycle 7. 2.  Hyperglycemia: Monitor.  Patient is receiving dexamethasone as a premedication.  3.  Indigestion: Continue omeprazole as needed. 4.  Pain: Continue tramadol as needed. 5.  Cough/congestion: Continue OTC treatments as needed. 6.  Hypertension: Patient's blood pressure is mildly elevated today.  Continue current treatment as prescribed. 7.  Thrombocytopenia: Mild, monitor.  Patient expressed understanding and was in agreement with this plan. He also understands that He can call clinic at any time with any questions, concerns, or complaints.   Cancer Staging Squamous cell lung cancer, left [facility]) Staging form: Lung, AJCC 8th Edition - Clinical stage from 05/16/2017: Stage IIIB (cT3, cN2, cM0) - Signed by Lloyd Huger, MD on 05/16/2017   Lloyd Huger, MD   07/10/2017 9:40 AM

## 2017-07-08 ENCOUNTER — Ambulatory Visit
Admission: RE | Admit: 2017-07-08 | Discharge: 2017-07-08 | Disposition: A | Discharge status: Home / Self Care | Payer: BLUE CROSS/BLUE SHIELD | Source: Ambulatory Visit | Attending: Radiation Oncology | Admitting: Radiation Oncology

## 2017-07-08 ENCOUNTER — Ambulatory Visit: Payer: BLUE CROSS/BLUE SHIELD | Admitting: Internal Medicine

## 2017-07-08 DIAGNOSIS — C3492 Malignant neoplasm of unspecified part of left bronchus or lung: Secondary | ICD-10-CM | POA: Diagnosis not present

## 2017-07-09 ENCOUNTER — Ambulatory Visit
Admission: RE | Admit: 2017-07-09 | Discharge: 2017-07-09 | Disposition: A | Discharge status: Home / Self Care | Payer: BLUE CROSS/BLUE SHIELD | Source: Ambulatory Visit | Attending: Radiation Oncology | Admitting: Radiation Oncology

## 2017-07-09 DIAGNOSIS — C3492 Malignant neoplasm of unspecified part of left bronchus or lung: Secondary | ICD-10-CM | POA: Diagnosis not present

## 2017-07-10 ENCOUNTER — Inpatient Hospital Stay: Payer: BLUE CROSS/BLUE SHIELD

## 2017-07-10 ENCOUNTER — Encounter: Payer: Self-pay | Admitting: Oncology

## 2017-07-10 ENCOUNTER — Inpatient Hospital Stay (HOSPITAL_BASED_OUTPATIENT_CLINIC_OR_DEPARTMENT_OTHER): Payer: BLUE CROSS/BLUE SHIELD | Admitting: Oncology

## 2017-07-10 ENCOUNTER — Ambulatory Visit
Admission: RE | Admit: 2017-07-10 | Discharge: 2017-07-10 | Disposition: A | Discharge status: Home / Self Care | Payer: BLUE CROSS/BLUE SHIELD | Source: Ambulatory Visit | Attending: Radiation Oncology | Admitting: Radiation Oncology

## 2017-07-10 ENCOUNTER — Other Ambulatory Visit: Payer: Self-pay

## 2017-07-10 VITALS — BP 142/90 | HR 79 | Temp 97.5°F | Resp 20 | Wt 217.6 lb

## 2017-07-10 DIAGNOSIS — Z79899 Other long term (current) drug therapy: Secondary | ICD-10-CM

## 2017-07-10 DIAGNOSIS — D696 Thrombocytopenia, unspecified: Secondary | ICD-10-CM | POA: Diagnosis not present

## 2017-07-10 DIAGNOSIS — J449 Chronic obstructive pulmonary disease, unspecified: Secondary | ICD-10-CM

## 2017-07-10 DIAGNOSIS — Z7984 Long term (current) use of oral hypoglycemic drugs: Secondary | ICD-10-CM

## 2017-07-10 DIAGNOSIS — Z8701 Personal history of pneumonia (recurrent): Secondary | ICD-10-CM

## 2017-07-10 DIAGNOSIS — D3502 Benign neoplasm of left adrenal gland: Secondary | ICD-10-CM | POA: Diagnosis not present

## 2017-07-10 DIAGNOSIS — I10 Essential (primary) hypertension: Secondary | ICD-10-CM

## 2017-07-10 DIAGNOSIS — Z5111 Encounter for antineoplastic chemotherapy: Secondary | ICD-10-CM

## 2017-07-10 DIAGNOSIS — J3489 Other specified disorders of nose and nasal sinuses: Secondary | ICD-10-CM

## 2017-07-10 DIAGNOSIS — R918 Other nonspecific abnormal finding of lung field: Secondary | ICD-10-CM

## 2017-07-10 DIAGNOSIS — C3492 Malignant neoplasm of unspecified part of left bronchus or lung: Secondary | ICD-10-CM | POA: Diagnosis not present

## 2017-07-10 DIAGNOSIS — K30 Functional dyspepsia: Secondary | ICD-10-CM | POA: Diagnosis not present

## 2017-07-10 DIAGNOSIS — F17210 Nicotine dependence, cigarettes, uncomplicated: Secondary | ICD-10-CM

## 2017-07-10 DIAGNOSIS — E1165 Type 2 diabetes mellitus with hyperglycemia: Secondary | ICD-10-CM

## 2017-07-10 DIAGNOSIS — Z8673 Personal history of transient ischemic attack (TIA), and cerebral infarction without residual deficits: Secondary | ICD-10-CM

## 2017-07-10 LAB — COMPREHENSIVE METABOLIC PANEL
ALK PHOS: 92 U/L (ref 38–126)
ALT: 21 U/L (ref 17–63)
AST: 20 U/L (ref 15–41)
Albumin: 3.7 g/dL (ref 3.5–5.0)
Anion gap: 9 (ref 5–15)
BUN: 15 mg/dL (ref 6–20)
CALCIUM: 8.9 mg/dL (ref 8.9–10.3)
CHLORIDE: 102 mmol/L (ref 101–111)
CO2: 25 mmol/L (ref 22–32)
CREATININE: 0.69 mg/dL (ref 0.61–1.24)
GFR calc Af Amer: >60 mL/min (ref >60)
GFR calc non Af Amer: >60 mL/min (ref >60)
GLUCOSE: 163 mg/dL — AB (ref 65–99)
Potassium: 4 mmol/L (ref 3.5–5.1)
SODIUM: 136 mmol/L (ref 135–145)
Total Bilirubin: 0.5 mg/dL (ref 0.3–1.2)
Total Protein: 6.6 g/dL (ref 6.5–8.1)

## 2017-07-10 LAB — CBC WITH DIFFERENTIAL/PLATELET
BASOS PCT: 1 %
Basophils Absolute: 0.1 10*3/uL (ref 0–0.1)
EOS ABS: 0.1 10*3/uL (ref 0–0.7)
Eosinophils Relative: 2 %
HCT: 40.8 % (ref 40.0–52.0)
HEMOGLOBIN: 13.8 g/dL (ref 13.0–18.0)
LYMPHS ABS: 0.6 10*3/uL — AB (ref 1.0–3.6)
Lymphocytes Relative: 11 %
MCH: 29.6 pg (ref 26.0–34.0)
MCHC: 33.8 g/dL (ref 32.0–36.0)
MCV: 87.6 fL (ref 80.0–100.0)
MONO ABS: 0.4 10*3/uL (ref 0.2–1.0)
MONOS PCT: 8 %
NEUTROS PCT: 78 %
Neutro Abs: 3.8 10*3/uL (ref 1.4–6.5)
Platelets: 134 10*3/uL — ABNORMAL LOW (ref 150–440)
RBC: 4.67 MIL/uL (ref 4.40–5.90)
RDW: 14.7 % — AB (ref 11.5–14.5)
WBC: 4.9 10*3/uL (ref 3.8–10.6)

## 2017-07-10 MED ORDER — SODIUM CHLORIDE 0.9 % IV SOLN
Freq: Once | INTRAVENOUS | Status: AC
  Administered 2017-07-10: 10:00:00 via INTRAVENOUS
  Filled 2017-07-10: qty 1000

## 2017-07-10 MED ORDER — DIPHENHYDRAMINE HCL 50 MG/ML IJ SOLN
25.0000 mg | Freq: Once | INTRAMUSCULAR | Status: AC
  Administered 2017-07-10: 25 mg via INTRAVENOUS
  Filled 2017-07-10: qty 1

## 2017-07-10 MED ORDER — SODIUM CHLORIDE 0.9 % IV SOLN
45.0000 mg/m2 | Freq: Once | INTRAVENOUS | Status: AC
  Administered 2017-07-10: 96 mg via INTRAVENOUS
  Filled 2017-07-10: qty 16

## 2017-07-10 MED ORDER — FAMOTIDINE IN NACL 20-0.9 MG/50ML-% IV SOLN
20.0000 mg | Freq: Once | INTRAVENOUS | Status: AC
  Administered 2017-07-10: 20 mg via INTRAVENOUS
  Filled 2017-07-10: qty 50

## 2017-07-10 MED ORDER — PALONOSETRON HCL INJECTION 0.25 MG/5ML
0.2500 mg | Freq: Once | INTRAVENOUS | Status: AC
  Administered 2017-07-10: 0.25 mg via INTRAVENOUS
  Filled 2017-07-10: qty 5

## 2017-07-10 MED ORDER — DEXAMETHASONE SODIUM PHOSPHATE 10 MG/ML IJ SOLN
10.0000 mg | Freq: Once | INTRAMUSCULAR | Status: AC
  Administered 2017-07-10: 10 mg via INTRAVENOUS
  Filled 2017-07-10: qty 1

## 2017-07-10 MED ORDER — DEXAMETHASONE SODIUM PHOSPHATE 100 MG/10ML IJ SOLN: 10.0000 mg | Freq: Once | INTRAMUSCULAR | Status: DC

## 2017-07-10 MED ORDER — SODIUM CHLORIDE 0.9 % IV SOLN
300.0000 mg | Freq: Once | INTRAVENOUS | Status: AC
  Administered 2017-07-10: 300 mg via INTRAVENOUS
  Filled 2017-07-10: qty 30

## 2017-07-10 NOTE — Progress Notes (Signed)
Patient denies any concerns today.  

## 2017-07-11 ENCOUNTER — Telehealth: Payer: Self-pay | Admitting: *Deleted

## 2017-07-11 ENCOUNTER — Other Ambulatory Visit: Payer: Self-pay | Admitting: *Deleted

## 2017-07-11 ENCOUNTER — Ambulatory Visit
Admission: RE | Admit: 2017-07-11 | Discharge: 2017-07-11 | Disposition: A | Discharge status: Home / Self Care | Payer: BLUE CROSS/BLUE SHIELD | Source: Ambulatory Visit | Attending: Radiation Oncology | Admitting: Radiation Oncology

## 2017-07-11 ENCOUNTER — Encounter: Payer: Self-pay | Admitting: *Deleted

## 2017-07-11 ENCOUNTER — Encounter: Payer: Self-pay | Admitting: Nurse Practitioner

## 2017-07-11 ENCOUNTER — Inpatient Hospital Stay (HOSPITAL_BASED_OUTPATIENT_CLINIC_OR_DEPARTMENT_OTHER): Payer: BLUE CROSS/BLUE SHIELD | Admitting: Nurse Practitioner

## 2017-07-11 ENCOUNTER — Inpatient Hospital Stay: Payer: BLUE CROSS/BLUE SHIELD

## 2017-07-11 ENCOUNTER — Other Ambulatory Visit: Payer: Self-pay

## 2017-07-11 VITALS — BP 150/87 | HR 118 | Temp 101.2°F | Resp 24 | Ht 68.0 in | Wt 217.9 lb

## 2017-07-11 DIAGNOSIS — Z7984 Long term (current) use of oral hypoglycemic drugs: Secondary | ICD-10-CM

## 2017-07-11 DIAGNOSIS — R509 Fever, unspecified: Secondary | ICD-10-CM

## 2017-07-11 DIAGNOSIS — K30 Functional dyspepsia: Secondary | ICD-10-CM | POA: Diagnosis not present

## 2017-07-11 DIAGNOSIS — J3489 Other specified disorders of nose and nasal sinuses: Secondary | ICD-10-CM

## 2017-07-11 DIAGNOSIS — E860 Dehydration: Secondary | ICD-10-CM

## 2017-07-11 DIAGNOSIS — E1165 Type 2 diabetes mellitus with hyperglycemia: Secondary | ICD-10-CM | POA: Diagnosis not present

## 2017-07-11 DIAGNOSIS — J449 Chronic obstructive pulmonary disease, unspecified: Secondary | ICD-10-CM | POA: Diagnosis not present

## 2017-07-11 DIAGNOSIS — D696 Thrombocytopenia, unspecified: Secondary | ICD-10-CM

## 2017-07-11 DIAGNOSIS — Z5189 Encounter for other specified aftercare: Secondary | ICD-10-CM

## 2017-07-11 DIAGNOSIS — J019 Acute sinusitis, unspecified: Secondary | ICD-10-CM

## 2017-07-11 DIAGNOSIS — R918 Other nonspecific abnormal finding of lung field: Secondary | ICD-10-CM

## 2017-07-11 DIAGNOSIS — Z8701 Personal history of pneumonia (recurrent): Secondary | ICD-10-CM

## 2017-07-11 DIAGNOSIS — I10 Essential (primary) hypertension: Secondary | ICD-10-CM

## 2017-07-11 DIAGNOSIS — B9689 Other specified bacterial agents as the cause of diseases classified elsewhere: Secondary | ICD-10-CM | POA: Diagnosis not present

## 2017-07-11 DIAGNOSIS — Z5111 Encounter for antineoplastic chemotherapy: Secondary | ICD-10-CM

## 2017-07-11 DIAGNOSIS — D3502 Benign neoplasm of left adrenal gland: Secondary | ICD-10-CM

## 2017-07-11 DIAGNOSIS — C3492 Malignant neoplasm of unspecified part of left bronchus or lung: Secondary | ICD-10-CM | POA: Diagnosis not present

## 2017-07-11 DIAGNOSIS — Z79899 Other long term (current) drug therapy: Secondary | ICD-10-CM

## 2017-07-11 DIAGNOSIS — F17210 Nicotine dependence, cigarettes, uncomplicated: Secondary | ICD-10-CM

## 2017-07-11 DIAGNOSIS — Z8673 Personal history of transient ischemic attack (TIA), and cerebral infarction without residual deficits: Secondary | ICD-10-CM

## 2017-07-11 LAB — COMPREHENSIVE METABOLIC PANEL
ALT: 30 U/L (ref 17–63)
AST: 28 U/L (ref 15–41)
Albumin: 4 g/dL (ref 3.5–5.0)
Alkaline Phosphatase: 91 U/L (ref 38–126)
Anion gap: 10 (ref 5–15)
BILIRUBIN TOTAL: 0.6 mg/dL (ref 0.3–1.2)
BUN: 14 mg/dL (ref 6–20)
CO2: 29 mmol/L (ref 22–32)
CREATININE: 0.91 mg/dL (ref 0.61–1.24)
Calcium: 8.6 mg/dL — ABNORMAL LOW (ref 8.9–10.3)
Chloride: 95 mmol/L — ABNORMAL LOW (ref 101–111)
GFR calc non Af Amer: >60 mL/min (ref >60)
GLUCOSE: 138 mg/dL — AB (ref 65–99)
Potassium: 4 mmol/L (ref 3.5–5.1)
SODIUM: 134 mmol/L — AB (ref 135–145)
TOTAL PROTEIN: 6.9 g/dL (ref 6.5–8.1)

## 2017-07-11 LAB — CBC WITH DIFFERENTIAL/PLATELET
BASOS PCT: 0 %
Basophils Absolute: 0 10*3/uL (ref 0–0.1)
EOS ABS: 0 10*3/uL (ref 0–0.7)
Eosinophils Relative: 1 %
HCT: 41.2 % (ref 40.0–52.0)
Hemoglobin: 13.9 g/dL (ref 13.0–18.0)
LYMPHS ABS: 0.1 10*3/uL — AB (ref 1.0–3.6)
Lymphocytes Relative: 1 %
MCH: 29.7 pg (ref 26.0–34.0)
MCHC: 33.8 g/dL (ref 32.0–36.0)
MCV: 87.9 fL (ref 80.0–100.0)
MONOS PCT: 9 %
Monocytes Absolute: 0.5 10*3/uL (ref 0.2–1.0)
Neutro Abs: 5.3 10*3/uL (ref 1.4–6.5)
Neutrophils Relative %: 89 %
PLATELETS: 108 10*3/uL — AB (ref 150–440)
RBC: 4.69 MIL/uL (ref 4.40–5.90)
RDW: 14.8 % — ABNORMAL HIGH (ref 11.5–14.5)
WBC: 6 10*3/uL (ref 3.8–10.6)

## 2017-07-11 LAB — INFLUENZA PANEL BY PCR (TYPE A & B)
Influenza A By PCR: NEGATIVE
Influenza B By PCR: NEGATIVE

## 2017-07-11 LAB — MAGNESIUM: Magnesium: 1.6 mg/dL — ABNORMAL LOW (ref 1.7–2.4)

## 2017-07-11 MED ORDER — LEVOFLOXACIN 750 MG PO TABS: 750.0000 mg | ORAL_TABLET | Freq: Every day | ORAL | 0 refills | Status: AC

## 2017-07-11 MED ORDER — SODIUM CHLORIDE 0.9 % IV SOLN
Freq: Once | INTRAVENOUS | Status: AC
  Administered 2017-07-11: 11:00:00 via INTRAVENOUS
  Filled 2017-07-11: qty 1000

## 2017-07-11 MED ORDER — ACETAMINOPHEN 500 MG PO TABS
1000.0000 mg | ORAL_TABLET | Freq: Four times a day (QID) | ORAL | Status: AC | PRN
  Administered 2017-07-11: 1000 mg via ORAL
  Filled 2017-07-11: qty 2

## 2017-07-11 NOTE — Telephone Encounter (Signed)
Gabriel Mendez called patient reports not feeling well and has temp 100.9. Appointment added for lab/ NP Orders per VO L Zenia Resides, NP cbc, CMP, Mg+. Lab informed of add on

## 2017-07-11 NOTE — Progress Notes (Signed)
Patient here for symptom management. States he woke up this morning feeling hot and tired. He feels worn down. Dyspnea with exertion, bright red flush to his face and neck. He has a productive cough and reports fair amount of yellow tinged sputum.

## 2017-07-11 NOTE — Progress Notes (Signed)
Gabriel Mendez not feeling well today.   VS checked after radiation.  He is running a fever with elevated BP, pulse and resp.  Triage RN called to have patient seen by symptom mgmt clinic.

## 2017-07-11 NOTE — Progress Notes (Signed)
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 Grayland Ormond, on 07/10/17.  Initially, CT of chest was ordered by pulmonology for concerns of pneumonia and shortness of breath on 04/28/17.  CT showed pneumonia which was treated with Augmentin.  A chest x-ray on 05/12/17 was ordered to evaluate resolution of pneumonia.  However, persistent right upper lobe opacity was discovered.  Cytology and pathology positive for squamous cell carcinoma.  05/23/17 negative for intracranial metastasis.  18 mm left parotid nodule nonspecific.  PET scan on 05/27/17 showed hypermetabolic right hilar mass partial postobstructive collapse of the right upper lobe consistent with bronchogenic carcinoma.  Subcarinal and ipsilateral nodal metastasis without evidence of distant metastasis.  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  FDG PET scan staging T2b N2 M0.  Case discussed at tumor conference.  Recommendation concurrent chemotherapy using carboplatin and Taxol weekly with concurrent daily radiation.  Also benefit from maintenance immunotherapy with Imfinzi at the conclusion of chemo/xrt. Patient declined port. Initiated chemo on 06/05/17. Initiated XRT on 06/11/17.  Patient is tolerating chemoradiation without significant complications.   Interval history- Patient presents to [facility] Management [facility] from radiation this morning for complaints of low grade fever that started today. He says he 'woke up feeling bad'.  Associated symptoms: sinus and chest congestion, coughing, yellow sputum, rhinorrhea. Patient felt well yesterday and has tolerated 6 cycles of weekly TC chemo without complications. Feels cough, sputum, and congestion are worse than baseline. He states his sleep was not impaired. Has not eaten this morning. Has some gas with each cycle of chemo that resolves with omeprazole which he has not taken today. He has not taken anything for his symptoms. He is unaware of sick contacts. Received Augmentin antibiotic for pneumonia approximately 2-3 months ago.    ECOG FS:1 - Symptomatic but completely ambulatory  Review of systems- Review of Systems  Constitutional: Positive for fever and malaise/fatigue. Negative for chills, diaphoresis and weight loss.  HENT: Positive for congestion and sinus pain. Negative for ear pain, nosebleeds and sore throat.   Eyes: Negative.   Respiratory: Positive for cough and sputum production. Negative for hemoptysis, shortness of breath and wheezing.   Cardiovascular: Negative.   Gastrointestinal: Negative.   Genitourinary: Negative.   Musculoskeletal: Negative.   Skin: Negative.   Neurological: Negative.  Negative for weakness.  Endo/Heme/Allergies: Negative.   Psychiatric/Behavioral: The patient is nervous/anxious.      Current treatment- Carboplatin+Taxol- s/p cycle 6 (last 07/10/17) & XRT (last 07/11/17)  Allergies  Allergen Reactions  . Shrimp [Shellfish Allergy] Nausea And Vomiting     Past Medical History:  Diagnosis Date  . COPD (chronic obstructive pulmonary disease) ([place])   . Diabetes mellitus without complication ([place])   . Hypertension   . Pneumonia      Past Surgical History:  Procedure Laterality Date  . ENDOBRONCHIAL ULTRASOUND N/A 05/12/2017   Procedure: ENDOBRONCHIAL ULTRASOUND;  Surgeon: Laverle Hobby, MD;  Location: [facility] ORS;  Service: Pulmonary;  Laterality: N/A;  . FRACTURE SURGERY Left    ANKLE X 2  .  TONSILLECTOMY       Social History   Socioeconomic History  . Marital status: Divorced    Spouse name: Not on file  . Number of children: Not on file  . Years of education: Not on file  . Highest education level: Not on file  Social Needs  . Financial resource strain: Not on file  . Food insecurity - worry: Not on file  . Food insecurity - inability: Not on file  . Transportation needs - medical: Not on file  . Transportation needs - non-medical: Not on file  Occupational History  . Not on file  Tobacco Use  . Smoking status: Current Every Day Smoker    Packs/day: 1.50  . Smokeless tobacco: Never Used  Substance and Sexual Activity  . Alcohol use: No  . Drug use: Not on file  . Sexual activity: Not on file  Other Topics Concern  . Not on file  Social History Narrative  . Not on file    Family History  Problem Relation Age of Onset  . Stroke Mother   . Diabetes Mother   . Hypertension Mother   . Diabetes Father   . Hypertension Father   . Diabetes Sister   . Diabetes Brother   . Heart attack Paternal Uncle   . Stroke Maternal Grandmother      Current Outpatient Medications:  .  albuterol (PROVENTIL HFA;VENTOLIN HFA) 108 (90 BASE) MCG/ACT inhaler, Inhale 2 puffs into the lungs every 6 (six) hours as needed for wheezing or shortness of breath., Disp: , Rfl:  .  albuterol (PROVENTIL) (2.5 MG/3ML) 0.083% nebulizer solution, Take 2.5 mg every 6 (six) hours as needed by nebulization for wheezing or shortness of breath., Disp: , Rfl:  .  beclomethasone (QVAR) 80 MCG/ACT inhaler, Inhale 1 puff into the lungs 2 (two) times daily., Disp: , Rfl:  .  glipiZIDE (GLUCOTROL) 5 MG tablet, Take 5 mg by mouth daily before breakfast., Disp: , Rfl:  .  ibuprofen (ADVIL,MOTRIN) 200 MG tablet, Take 400-800 mg every 8 (eight) hours as needed by mouth (for pain.)., Disp: , Rfl:  .  lisinopril-hydrochlorothiazide (PRINZIDE,ZESTORETIC) 20-25 MG tablet, Take 1 tablet by mouth daily., Disp: , Rfl:  .   metFORMIN (GLUCOPHAGE) 1000 MG tablet, Take 1,000 mg by mouth 2 (two) times daily., Disp: , Rfl:  .  omeprazole (PRILOSEC) 20 MG capsule, Take 1 capsule (20 mg total) by mouth daily., Disp: 30 capsule, Rfl: 2 .  ondansetron (ZOFRAN) 8 MG tablet, Take 1 tablet (8 mg total) by mouth 2 (two) times daily as needed for refractory nausea / vomiting., Disp: 30 tablet, Rfl: 2 .  prochlorperazine (COMPAZINE) 10 MG tablet, Take 1 tablet (10 mg total) by mouth every 6 (six) hours as needed (Nausea or vomiting)., Disp: 60 tablet, Rfl: 2 .  sucralfate (CARAFATE) 1 g tablet, Take 1 tablet (1 g total) by mouth 3 (three) times daily. Dissolve in 2-3 tbsp warm water, swish and swallow., Disp: 90 tablet, Rfl: 3 .  traMADol (ULTRAM) 50 MG tablet, Take 1 tablet (50 mg total) by mouth every 6 (six) hours as needed., Disp: 30 tablet, Rfl: 0  Physical exam:  Vitals:   07/11/17 0937 07/11/17 0940 07/11/17 0944  BP:   (!) 150/87  Pulse:   (!) 118  Resp: (!) 24 (!) 24   Temp:   (!) 101.2 F (38.4 C)  TempSrc:   Tympanic  SpO2: 99%    Weight: 217 lb 14.4 oz (98.8 kg)  Height: 5\' 8"  (1.727 m)      Physical Exam  Constitutional: He is oriented to person, place, and time and well-developed, well-nourished, and in no distress. No distress.  HENT:  Head: Normocephalic and atraumatic.  Right Ear: Hearing, tympanic membrane and external ear normal. No drainage or tenderness. No mastoid tenderness. Tympanic membrane is not injected.  Left Ear: External ear normal. No drainage or tenderness. No mastoid tenderness.  Nose: Rhinorrhea present. Right sinus exhibits frontal sinus tenderness. Left sinus exhibits frontal sinus tenderness.  Mouth/Throat: Oropharynx is clear and moist.  Cerumen occluding left ear  Eyes: Conjunctivae are normal. Pupils are equal, round, and reactive to light. No scleral icterus.  Neck: Normal range of motion. Neck supple.  Cardiovascular: Regular rhythm and normal heart sounds. Tachycardia  present.  Pulmonary/Chest: No stridor. No respiratory distress. He has rhonchi in the right upper field, the right lower field, the left upper field and the left lower field.  Coughing during assessment, strong cough, no sputum visualized  Abdominal: Soft. Bowel sounds are normal. He exhibits no distension. There is no tenderness.  Musculoskeletal: Normal range of motion. He exhibits no deformity.  Neurological: He is alert and oriented to person, place, and time.  Skin: No rash noted.  Flushed, warm to touch  Psychiatric: His mood appears anxious.     CMP Latest Ref Rng & Units 07/11/2017  Glucose 65 - 99 mg/dL 138(H)  BUN 6 - 20 mg/dL 14  Creatinine 0.61 - 1.24 mg/dL 0.91  Sodium 135 - 145 mmol/L 134(L)  Potassium 3.5 - 5.1 mmol/L 4.0  Chloride 101 - 111 mmol/L 95(L)  CO2 22 - 32 mmol/L 29  Calcium 8.9 - 10.3 mg/dL 8.6(L)  Total Protein 6.5 - 8.1 g/dL 6.9  Total Bilirubin 0.3 - 1.2 mg/dL 0.6  Alkaline Phos 38 - 126 U/L 91  AST 15 - 41 U/L 28  ALT 17 - 63 U/L 30   CBC Latest Ref Rng & Units 07/11/2017  WBC 3.8 - 10.6 K/uL 6.0  Hemoglobin 13.0 - 18.0 g/dL 13.9  Hematocrit 40.0 - 52.0 % 41.2  Platelets 150 - 440 K/uL 108(L)   IMPRESSION: 1. Hypermetabolic RIGHT hilar mass with partial postobstructive collapse of the RIGHT upper lobe consists with bronchogenic carcinoma. 2. Subcarinal and ipsilateral nodal metastasis. 3. No evidence distant metastatic disease. 4. FDG PET scan staging T2b N2 M0 5. Hypermetabolic LEFT parotid nodule. 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 Consider ENT consultation.   Electronically Signed   By: Suzy Bouchard M.D.   On: 05/27/2017 14:56    Assessment and plan- Patient is a 57 y.o. male with history of stage IIIb squamous cell carcinoma of the left lung who presents to [facility] Management Clinic for complaints of fever.   1. Stage IIIb squamous cell carcinoma of the left lung-   S/p cycle 6 TC weekly chemo yesterday, plan for 8 cycles, with concurrent daily XRT. Plan for maintenance immunotherapy, Imfinzi, every 2 weeks for 12 months after completing chemo and radiation. Tolerating well. Follow-up with Dr. Grayland Ormond next week for consideration of cycle 7.   2. Acute bacterial sinusitis- WBC 6.0, ANC 5.3, fluids and tylenol given for fever and dehydration. Flu negative. Temp improved to 98.4, HR 88. Pt states feeling significantly improved. Discussed otc management and will send with Levaquin x 1 week given fever.  Advised to monitor for sx of hypoglycemia with levaquin.    Visit Diagnosis 1. Squamous cell lung cancer,  left ([place])   2. Acute bacterial sinusitis     Patient expressed understanding and was in agreement with this plan. He also understands that He can call clinic at any time with any questions, concerns, or complaints. Patient advised to notify the clinic if there is no improvement in symptoms or if symptoms worsen in next 3-4 days.    A total of (30) minutes of face-to-face time was spent with this patient with greater than 50% of that time in counseling and care-coordination.  Beckey Rutter, DNP, AGNP-C [place] [facility] [phone] (office) 07/11/17 9:55 AM

## 2017-07-13 NOTE — Progress Notes (Signed)
[place]  Telephone:(336) [phone] Fax:(336) [phone]  ID: Gabriel Mendez OB: [birth year]  MR#: [id number]  PZW#:[id number]  Patient Care Team: Gabriel Mendez as PCP - General (Physician Assistant) Telford Nab, RN as Registered Nurse  CHIEF COMPLAINT: Stage IIIb squamous cell carcinoma of the left lung.  INTERVAL HISTORY: Patient returns to clinic today for further evaluation and consideration of cycle 7 of weekly carboplatinum and Taxol.  He continues to tolerate his treatments well without significant side effects.  This past Friday he was noted to have a fever and was placed on antibiotics.  His symptoms have now resolved. He has no neurologic complaints. He has a good appetite and denies weight loss.  He denies any chest pain, shortness of breath, or hemoptysis.  He has an occasional cough.  He has no nausea, vomiting, constipation, or diarrhea.  He has no urinary complaints.  Patient offers no further specific complaints today.  REVIEW OF SYSTEMS:   Review of Systems  Constitutional: Positive for fever. Negative for malaise/fatigue and weight loss.  HENT: Negative for congestion.   Respiratory: Positive for cough. Negative for hemoptysis and shortness of breath.   Cardiovascular: Negative.  Negative for chest pain and leg swelling.  Gastrointestinal: Negative for abdominal pain, heartburn, nausea and vomiting.  Genitourinary: Negative.   Musculoskeletal: Positive for joint pain.  Skin: Negative.  Negative for rash.  Neurological: Negative.  Negative for sensory change and weakness.  Psychiatric/Behavioral: Negative.  The patient is not nervous/anxious.     As per HPI. Otherwise, a complete review of systems is negative.  PAST MEDICAL HISTORY: Past Medical History:  Diagnosis Date  . COPD (chronic obstructive pulmonary disease) ([place])   . Diabetes mellitus without complication ([place])   . Hypertension   . Pneumonia     PAST SURGICAL  HISTORY: Past Surgical History:  Procedure Laterality Date  . ENDOBRONCHIAL ULTRASOUND N/A 05/12/2017   Procedure: ENDOBRONCHIAL ULTRASOUND;  Surgeon: Laverle Hobby, MD;  Location: [facility] ORS;  Service: Pulmonary;  Laterality: N/A;  . FRACTURE SURGERY Left    ANKLE X 2  . TONSILLECTOMY      FAMILY HISTORY: Family History  Problem Relation Age of Onset  . Stroke Mother   . Diabetes Mother   . Hypertension Mother   . Diabetes Father   . Hypertension Father   . Diabetes Sister   . Diabetes Brother   . Heart attack Paternal Uncle   . Stroke Maternal Grandmother     ADVANCED DIRECTIVES (Y/N):  N  HEALTH MAINTENANCE: Social History   Tobacco Use  . Smoking status: Current Every Day Smoker    Packs/day: 1.50  . Smokeless tobacco: Never Used  Substance Use Topics  . Alcohol use: No  . Drug use: Not on file     Colonoscopy:  PAP:  Bone density:  Lipid panel:  Allergies  Allergen Reactions  . Shrimp [Shellfish Allergy] Nausea And Vomiting    Current Outpatient Medications  Medication Sig Dispense Refill  . albuterol (PROVENTIL HFA;VENTOLIN HFA) 108 (90 BASE) MCG/ACT inhaler Inhale 2 puffs into the lungs every 6 (six) hours as needed for wheezing or shortness of breath.    Marland Kitchen albuterol (PROVENTIL) (2.5 MG/3ML) 0.083% nebulizer solution Take 2.5 mg every 6 (six) hours as needed by nebulization for wheezing or shortness of breath.    . beclomethasone (QVAR) 80 MCG/ACT inhaler Inhale 1 puff into the lungs 2 (two) times daily.    Marland Kitchen glipiZIDE (GLUCOTROL) 5 MG tablet Take  5 mg by mouth daily before breakfast.    . ibuprofen (ADVIL,MOTRIN) 200 MG tablet Take 400-800 mg every 8 (eight) hours as needed by mouth (for pain.).    Marland Kitchen levofloxacin (LEVAQUIN) 750 MG tablet Take 1 tablet (750 mg total) by mouth daily for 7 days. 7 tablet 0  . lisinopril-hydrochlorothiazide (PRINZIDE,ZESTORETIC) 20-25 MG tablet Take 1 tablet by mouth daily.    . metFORMIN (GLUCOPHAGE) 1000 MG tablet  Take 1,000 mg by mouth 2 (two) times daily.    Marland Kitchen omeprazole (PRILOSEC) 20 MG capsule Take 1 capsule (20 mg total) by mouth daily. 30 capsule 2  . ondansetron (ZOFRAN) 8 MG tablet Take 1 tablet (8 mg total) by mouth 2 (two) times daily as needed for refractory nausea / vomiting. 30 tablet 2  . prochlorperazine (COMPAZINE) 10 MG tablet Take 1 tablet (10 mg total) by mouth every 6 (six) hours as needed (Nausea or vomiting). 60 tablet 2  . sucralfate (CARAFATE) 1 g tablet Take 1 tablet (1 g total) by mouth 3 (three) times daily. Dissolve in 2-3 tbsp warm water, swish and swallow. 90 tablet 3  . traMADol (ULTRAM) 50 MG tablet Take 1 tablet (50 mg total) by mouth every 6 (six) hours as needed. 30 tablet 0   No current facility-administered medications for this visit.     OBJECTIVE: Vitals:   07/17/17 1007  BP: (!) 165/97  Pulse: 93  Resp: 18  Temp: 97.8 F (36.6 C)     Body mass index is 33.07 kg/m.    ECOG FS:0 - Asymptomatic  General: Well-developed, well-nourished, no acute distress. Eyes: Pink conjunctiva, anicteric sclera. Lungs: Clear to auscultation bilaterally. Heart: Regular rate and rhythm. No rubs, murmurs, or gallops. Abdomen: Soft, nontender, nondistended. No organomegaly noted, normoactive bowel sounds. Musculoskeletal: No edema, cyanosis, or clubbing. Neuro: Alert, answering all questions appropriately. Cranial nerves grossly intact. Skin: No rashes or petechiae noted. Psych: Normal affect.  LAB RESULTS:  Lab Results  Component Value Date   NA 134 (L) 07/17/2017   K 4.2 07/17/2017   CL 98 (L) 07/17/2017   CO2 26 07/17/2017   GLUCOSE 149 (H) 07/17/2017   BUN 13 07/17/2017   CREATININE 0.70 07/17/2017   CALCIUM 8.9 07/17/2017   PROT 6.9 07/17/2017   ALBUMIN 3.7 07/17/2017   AST 18 07/17/2017   ALT 20 07/17/2017   ALKPHOS 74 07/17/2017   BILITOT 0.6 07/17/2017   GFRNONAA >60 07/17/2017   GFRAA >60 07/17/2017    Lab Results  Component Value Date   WBC 4.1  07/17/2017   NEUTROABS 3.2 07/17/2017   HGB 12.9 (L) 07/17/2017   HCT 37.7 (L) 07/17/2017   MCV 87.6 07/17/2017   PLT 161 07/17/2017     STUDIES: No results found.  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  PLAN:    1. 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  Patient was also discussed at cancer conference.  MRI of the brain is negative. PET scan results from May 27, 2017 reviewed independently with hypermetabolic right hilar mass as well as subcarinal and ipsilateral nodal metastasis.  No evidence of metastatic disease.  Patient will benefit from concurrent chemotherapy using carboplatinum and Taxol weekly along with daily XRT.  Given the stage of his disease, he will also benefit from maintenance immunotherapy with Imfinzi every 2 weeks for 12 months at the conclusion of his chemotherapy and XRT.  Patient has declined port  placement at this time, but will reconsider in the future.  Proceed with cycle 7 of weekly carboplatinum and Taxol today.  Continue daily XRT which will be completed on July 30, 2017.  Return to clinic in 1 week for consideration of cycle 8.  Patient will then return to clinic 6 weeks after the conclusion of his XRT for restaging PET scan and initiation of cycle 1 of Imfinzi. 2.  Hyperglycemia: Monitor.  Patient is receiving dexamethasone as a premedication.  3.  Indigestion: Continue omeprazole as needed. 4.  Pain: Continue tramadol as needed. 5.  Cough/congestion: Continue OTC treatments as needed. 6.  Hypertension: Patient's blood pressure is elevated today.  Continue current treatment as prescribed. 7.  Thrombocytopenia: Resolved. 8.  Fevers: Resolved.  Patient expressed understanding and was in agreement with this plan. He also understands that He can call clinic at any time with any questions, concerns, or complaints.   Cancer Staging Squamous cell lung cancer,  left [facility]) Staging form: Lung, AJCC 8th Edition - Clinical stage from 05/16/2017: Stage IIIB (cT3, cN2, cM0) - Signed by Lloyd Huger, MD on 05/16/2017   Lloyd Huger, MD   07/17/2017 10:21 AM

## 2017-07-14 ENCOUNTER — Ambulatory Visit
Admission: RE | Admit: 2017-07-14 | Discharge: 2017-07-14 | Disposition: A | Discharge status: Home / Self Care | Payer: BLUE CROSS/BLUE SHIELD | Source: Ambulatory Visit | Attending: Radiation Oncology | Admitting: Radiation Oncology

## 2017-07-14 DIAGNOSIS — C3492 Malignant neoplasm of unspecified part of left bronchus or lung: Secondary | ICD-10-CM | POA: Diagnosis not present

## 2017-07-15 ENCOUNTER — Ambulatory Visit
Admission: RE | Admit: 2017-07-15 | Discharge: 2017-07-15 | Disposition: A | Discharge status: Home / Self Care | Payer: BLUE CROSS/BLUE SHIELD | Source: Ambulatory Visit | Attending: Radiation Oncology | Admitting: Radiation Oncology

## 2017-07-15 DIAGNOSIS — C3492 Malignant neoplasm of unspecified part of left bronchus or lung: Secondary | ICD-10-CM | POA: Diagnosis not present

## 2017-07-16 ENCOUNTER — Ambulatory Visit
Admission: RE | Admit: 2017-07-16 | Discharge: 2017-07-16 | Disposition: A | Discharge status: Home / Self Care | Payer: BLUE CROSS/BLUE SHIELD | Source: Ambulatory Visit | Attending: Radiation Oncology | Admitting: Radiation Oncology

## 2017-07-16 DIAGNOSIS — C3492 Malignant neoplasm of unspecified part of left bronchus or lung: Secondary | ICD-10-CM | POA: Diagnosis not present

## 2017-07-17 ENCOUNTER — Inpatient Hospital Stay: Payer: BLUE CROSS/BLUE SHIELD

## 2017-07-17 ENCOUNTER — Ambulatory Visit
Admission: RE | Admit: 2017-07-17 | Discharge: 2017-07-17 | Disposition: A | Discharge status: Home / Self Care | Payer: BLUE CROSS/BLUE SHIELD | Source: Ambulatory Visit | Attending: Radiation Oncology | Admitting: Radiation Oncology

## 2017-07-17 ENCOUNTER — Inpatient Hospital Stay (HOSPITAL_BASED_OUTPATIENT_CLINIC_OR_DEPARTMENT_OTHER): Payer: BLUE CROSS/BLUE SHIELD | Admitting: Oncology

## 2017-07-17 ENCOUNTER — Ambulatory Visit: Payer: BLUE CROSS/BLUE SHIELD | Admitting: Internal Medicine

## 2017-07-17 ENCOUNTER — Encounter: Payer: Self-pay | Admitting: Oncology

## 2017-07-17 VITALS — BP 165/97 | HR 93 | Temp 97.8°F | Resp 18 | Wt 217.5 lb

## 2017-07-17 VITALS — BP 152/84 | HR 83

## 2017-07-17 DIAGNOSIS — D3502 Benign neoplasm of left adrenal gland: Secondary | ICD-10-CM | POA: Diagnosis not present

## 2017-07-17 DIAGNOSIS — Z5111 Encounter for antineoplastic chemotherapy: Secondary | ICD-10-CM

## 2017-07-17 DIAGNOSIS — K30 Functional dyspepsia: Secondary | ICD-10-CM

## 2017-07-17 DIAGNOSIS — Z79899 Other long term (current) drug therapy: Secondary | ICD-10-CM

## 2017-07-17 DIAGNOSIS — E1165 Type 2 diabetes mellitus with hyperglycemia: Secondary | ICD-10-CM

## 2017-07-17 DIAGNOSIS — D696 Thrombocytopenia, unspecified: Secondary | ICD-10-CM | POA: Diagnosis not present

## 2017-07-17 DIAGNOSIS — Z8701 Personal history of pneumonia (recurrent): Secondary | ICD-10-CM

## 2017-07-17 DIAGNOSIS — E860 Dehydration: Secondary | ICD-10-CM | POA: Diagnosis not present

## 2017-07-17 DIAGNOSIS — R918 Other nonspecific abnormal finding of lung field: Secondary | ICD-10-CM

## 2017-07-17 DIAGNOSIS — J449 Chronic obstructive pulmonary disease, unspecified: Secondary | ICD-10-CM | POA: Diagnosis not present

## 2017-07-17 DIAGNOSIS — I10 Essential (primary) hypertension: Secondary | ICD-10-CM

## 2017-07-17 DIAGNOSIS — J3489 Other specified disorders of nose and nasal sinuses: Secondary | ICD-10-CM | POA: Diagnosis not present

## 2017-07-17 DIAGNOSIS — B9689 Other specified bacterial agents as the cause of diseases classified elsewhere: Secondary | ICD-10-CM | POA: Diagnosis not present

## 2017-07-17 DIAGNOSIS — C3492 Malignant neoplasm of unspecified part of left bronchus or lung: Secondary | ICD-10-CM | POA: Diagnosis not present

## 2017-07-17 DIAGNOSIS — Z8673 Personal history of transient ischemic attack (TIA), and cerebral infarction without residual deficits: Secondary | ICD-10-CM

## 2017-07-17 DIAGNOSIS — J019 Acute sinusitis, unspecified: Secondary | ICD-10-CM

## 2017-07-17 DIAGNOSIS — F17210 Nicotine dependence, cigarettes, uncomplicated: Secondary | ICD-10-CM

## 2017-07-17 DIAGNOSIS — Z7984 Long term (current) use of oral hypoglycemic drugs: Secondary | ICD-10-CM

## 2017-07-17 LAB — COMPREHENSIVE METABOLIC PANEL
ALBUMIN: 3.7 g/dL (ref 3.5–5.0)
ALT: 20 U/L (ref 17–63)
AST: 18 U/L (ref 15–41)
Alkaline Phosphatase: 74 U/L (ref 38–126)
Anion gap: 10 (ref 5–15)
BUN: 13 mg/dL (ref 6–20)
CHLORIDE: 98 mmol/L — AB (ref 101–111)
CO2: 26 mmol/L (ref 22–32)
CREATININE: 0.7 mg/dL (ref 0.61–1.24)
Calcium: 8.9 mg/dL (ref 8.9–10.3)
GFR calc Af Amer: >60 mL/min (ref >60)
GLUCOSE: 149 mg/dL — AB (ref 65–99)
Potassium: 4.2 mmol/L (ref 3.5–5.1)
SODIUM: 134 mmol/L — AB (ref 135–145)
Total Bilirubin: 0.6 mg/dL (ref 0.3–1.2)
Total Protein: 6.9 g/dL (ref 6.5–8.1)

## 2017-07-17 LAB — CBC WITH DIFFERENTIAL/PLATELET
BASOS ABS: 0 10*3/uL (ref 0–0.1)
Basophils Relative: 1 %
Eosinophils Absolute: 0.1 10*3/uL (ref 0–0.7)
Eosinophils Relative: 1 %
HCT: 37.7 % — ABNORMAL LOW (ref 40.0–52.0)
Hemoglobin: 12.9 g/dL — ABNORMAL LOW (ref 13.0–18.0)
LYMPHS PCT: 15 %
Lymphs Abs: 0.6 10*3/uL — ABNORMAL LOW (ref 1.0–3.6)
MCH: 29.9 pg (ref 26.0–34.0)
MCHC: 34.2 g/dL (ref 32.0–36.0)
MCV: 87.6 fL (ref 80.0–100.0)
Monocytes Absolute: 0.3 10*3/uL (ref 0.2–1.0)
Monocytes Relative: 7 %
Neutro Abs: 3.2 10*3/uL (ref 1.4–6.5)
Neutrophils Relative %: 76 %
Platelets: 161 10*3/uL (ref 150–440)
RBC: 4.31 MIL/uL — AB (ref 4.40–5.90)
RDW: 15 % — ABNORMAL HIGH (ref 11.5–14.5)
WBC: 4.1 10*3/uL (ref 3.8–10.6)

## 2017-07-17 MED ORDER — SODIUM CHLORIDE 0.9 % IV SOLN
45.0000 mg/m2 | Freq: Once | INTRAVENOUS | Status: AC
  Administered 2017-07-17: 96 mg via INTRAVENOUS
  Filled 2017-07-17: qty 16

## 2017-07-17 MED ORDER — PALONOSETRON HCL INJECTION 0.25 MG/5ML
0.2500 mg | Freq: Once | INTRAVENOUS | Status: AC
  Administered 2017-07-17: 0.25 mg via INTRAVENOUS
  Filled 2017-07-17: qty 5

## 2017-07-17 MED ORDER — DEXAMETHASONE SODIUM PHOSPHATE 10 MG/ML IJ SOLN
10.0000 mg | Freq: Once | INTRAMUSCULAR | Status: AC
  Administered 2017-07-17: 10 mg via INTRAVENOUS
  Filled 2017-07-17: qty 1

## 2017-07-17 MED ORDER — DIPHENHYDRAMINE HCL 50 MG/ML IJ SOLN
25.0000 mg | Freq: Once | INTRAMUSCULAR | Status: AC
  Administered 2017-07-17: 25 mg via INTRAVENOUS
  Filled 2017-07-17: qty 1

## 2017-07-17 MED ORDER — SODIUM CHLORIDE 0.9 % IV SOLN
Freq: Once | INTRAVENOUS | Status: AC
  Administered 2017-07-17: 11:00:00 via INTRAVENOUS
  Filled 2017-07-17: qty 1000

## 2017-07-17 MED ORDER — SODIUM CHLORIDE 0.9 % IV SOLN: 10.0000 mg | Freq: Once | INTRAVENOUS | Status: DC

## 2017-07-17 MED ORDER — SODIUM CHLORIDE 0.9 % IV SOLN
300.0000 mg | Freq: Once | INTRAVENOUS | Status: AC
  Administered 2017-07-17: 300 mg via INTRAVENOUS
  Filled 2017-07-17: qty 30

## 2017-07-17 MED ORDER — METHYLPREDNISOLONE SODIUM SUCC 125 MG IJ SOLR
125.0000 mg | Freq: Once | INTRAMUSCULAR | Status: AC | PRN
  Administered 2017-07-17: 125 mg via INTRAVENOUS

## 2017-07-17 MED ORDER — FAMOTIDINE IN NACL 20-0.9 MG/50ML-% IV SOLN
20.0000 mg | Freq: Once | INTRAVENOUS | Status: AC
Start: 2017-07-17 — End: 2017-07-17
  Administered 2017-07-17: 20 mg via INTRAVENOUS
  Filled 2017-07-17: qty 50

## 2017-07-17 NOTE — Progress Notes (Signed)
Patient started coughing, feeling like something caught in his throat. VSS. Stopped Taxol and notified Dr Grayland Ormond. Per Dr Grayland Ormond give solumedrol 125mg  IV and stop Taxol treatment.

## 2017-07-17 NOTE — Progress Notes (Signed)
Patient denies any concerns today.  

## 2017-07-17 NOTE — Progress Notes (Signed)
1245-Patient's symptoms resolved. Tolerated carboplatin without any difficulties. Pt discharged home at 1330.

## 2017-07-18 ENCOUNTER — Ambulatory Visit
Admission: RE | Admit: 2017-07-18 | Discharge: 2017-07-18 | Disposition: A | Discharge status: Home / Self Care | Payer: BLUE CROSS/BLUE SHIELD | Source: Ambulatory Visit | Attending: Radiation Oncology | Admitting: Radiation Oncology

## 2017-07-18 DIAGNOSIS — C3492 Malignant neoplasm of unspecified part of left bronchus or lung: Secondary | ICD-10-CM | POA: Diagnosis not present

## 2017-07-21 ENCOUNTER — Ambulatory Visit
Admission: RE | Admit: 2017-07-21 | Discharge: 2017-07-21 | Disposition: A | Discharge status: Home / Self Care | Payer: BLUE CROSS/BLUE SHIELD | Source: Ambulatory Visit | Attending: Radiation Oncology | Admitting: Radiation Oncology

## 2017-07-21 DIAGNOSIS — C3492 Malignant neoplasm of unspecified part of left bronchus or lung: Secondary | ICD-10-CM | POA: Diagnosis not present

## 2017-07-22 ENCOUNTER — Ambulatory Visit: Payer: BLUE CROSS/BLUE SHIELD

## 2017-07-23 ENCOUNTER — Other Ambulatory Visit: Payer: Self-pay | Admitting: *Deleted

## 2017-07-23 ENCOUNTER — Ambulatory Visit
Admission: RE | Admit: 2017-07-23 | Discharge: 2017-07-23 | Disposition: A | Discharge status: Home / Self Care | Payer: BLUE CROSS/BLUE SHIELD | Source: Ambulatory Visit | Attending: Radiation Oncology | Admitting: Radiation Oncology

## 2017-07-23 DIAGNOSIS — C3492 Malignant neoplasm of unspecified part of left bronchus or lung: Secondary | ICD-10-CM | POA: Diagnosis not present

## 2017-07-24 ENCOUNTER — Inpatient Hospital Stay: Payer: BLUE CROSS/BLUE SHIELD | Attending: Oncology

## 2017-07-24 ENCOUNTER — Ambulatory Visit
Admission: RE | Admit: 2017-07-24 | Discharge: 2017-07-24 | Disposition: A | Discharge status: Home / Self Care | Payer: BLUE CROSS/BLUE SHIELD | Source: Ambulatory Visit | Attending: Radiation Oncology | Admitting: Radiation Oncology

## 2017-07-24 ENCOUNTER — Inpatient Hospital Stay: Payer: BLUE CROSS/BLUE SHIELD

## 2017-07-24 DIAGNOSIS — C3492 Malignant neoplasm of unspecified part of left bronchus or lung: Secondary | ICD-10-CM | POA: Diagnosis not present

## 2017-07-24 DIAGNOSIS — C3402 Malignant neoplasm of left main bronchus: Secondary | ICD-10-CM | POA: Insufficient documentation

## 2017-07-24 LAB — CBC WITH DIFFERENTIAL/PLATELET
Basophils Absolute: 0 10*3/uL (ref 0–0.1)
Basophils Relative: 1 %
EOS PCT: 1 %
Eosinophils Absolute: 0.1 10*3/uL (ref 0–0.7)
HCT: 39.5 % — ABNORMAL LOW (ref 40.0–52.0)
HEMOGLOBIN: 13.5 g/dL (ref 13.0–18.0)
LYMPHS ABS: 0.6 10*3/uL — AB (ref 1.0–3.6)
LYMPHS PCT: 16 %
MCH: 30.3 pg (ref 26.0–34.0)
MCHC: 34.2 g/dL (ref 32.0–36.0)
MCV: 88.8 fL (ref 80.0–100.0)
MONOS PCT: 11 %
Monocytes Absolute: 0.4 10*3/uL (ref 0.2–1.0)
Neutro Abs: 2.8 10*3/uL (ref 1.4–6.5)
Neutrophils Relative %: 71 %
PLATELETS: 289 10*3/uL (ref 150–440)
RBC: 4.45 MIL/uL (ref 4.40–5.90)
RDW: 16.4 % — ABNORMAL HIGH (ref 11.5–14.5)
WBC: 3.9 10*3/uL (ref 3.8–10.6)

## 2017-07-24 LAB — COMPREHENSIVE METABOLIC PANEL
ALBUMIN: 4 g/dL (ref 3.5–5.0)
ALT: 17 U/L (ref 17–63)
AST: 20 U/L (ref 15–41)
Alkaline Phosphatase: 75 U/L (ref 38–126)
Anion gap: 9 (ref 5–15)
BUN: 12 mg/dL (ref 6–20)
CHLORIDE: 99 mmol/L — AB (ref 101–111)
CO2: 25 mmol/L (ref 22–32)
CREATININE: 0.74 mg/dL (ref 0.61–1.24)
Calcium: 8.9 mg/dL (ref 8.9–10.3)
GFR calc Af Amer: >60 mL/min (ref >60)
Glucose, Bld: 148 mg/dL — ABNORMAL HIGH (ref 65–99)
POTASSIUM: 4 mmol/L (ref 3.5–5.1)
SODIUM: 133 mmol/L — AB (ref 135–145)
Total Bilirubin: 0.8 mg/dL (ref 0.3–1.2)
Total Protein: 6.9 g/dL (ref 6.5–8.1)

## 2017-07-24 NOTE — Progress Notes (Signed)
Spoke with Rulon Abide, NP, via telephone. Per NP order: No treatment today. Patient discharged to home.

## 2017-07-25 ENCOUNTER — Ambulatory Visit
Admission: RE | Admit: 2017-07-25 | Discharge: 2017-07-25 | Disposition: A | Discharge status: Home / Self Care | Payer: BLUE CROSS/BLUE SHIELD | Source: Ambulatory Visit | Attending: Radiation Oncology | Admitting: Radiation Oncology

## 2017-07-25 DIAGNOSIS — C3492 Malignant neoplasm of unspecified part of left bronchus or lung: Secondary | ICD-10-CM | POA: Diagnosis not present

## 2017-07-28 ENCOUNTER — Ambulatory Visit
Admission: RE | Admit: 2017-07-28 | Discharge: 2017-07-28 | Disposition: A | Discharge status: Home / Self Care | Payer: BLUE CROSS/BLUE SHIELD | Source: Ambulatory Visit | Attending: Radiation Oncology | Admitting: Radiation Oncology

## 2017-07-28 DIAGNOSIS — C3492 Malignant neoplasm of unspecified part of left bronchus or lung: Secondary | ICD-10-CM | POA: Diagnosis not present

## 2017-07-29 ENCOUNTER — Ambulatory Visit
Admission: RE | Admit: 2017-07-29 | Discharge: 2017-07-29 | Disposition: A | Discharge status: Home / Self Care | Payer: BLUE CROSS/BLUE SHIELD | Source: Ambulatory Visit | Attending: Radiation Oncology | Admitting: Radiation Oncology

## 2017-07-29 DIAGNOSIS — C3492 Malignant neoplasm of unspecified part of left bronchus or lung: Secondary | ICD-10-CM | POA: Diagnosis not present

## 2017-07-30 ENCOUNTER — Ambulatory Visit: Payer: BLUE CROSS/BLUE SHIELD

## 2017-07-30 ENCOUNTER — Ambulatory Visit
Admission: RE | Admit: 2017-07-30 | Discharge: 2017-07-30 | Disposition: A | Discharge status: Home / Self Care | Payer: BLUE CROSS/BLUE SHIELD | Source: Ambulatory Visit | Attending: Radiation Oncology | Admitting: Radiation Oncology

## 2017-07-30 DIAGNOSIS — C3492 Malignant neoplasm of unspecified part of left bronchus or lung: Secondary | ICD-10-CM | POA: Diagnosis not present

## 2017-07-31 ENCOUNTER — Ambulatory Visit
Admission: RE | Admit: 2017-07-31 | Discharge: 2017-07-31 | Disposition: A | Discharge status: Home / Self Care | Payer: BLUE CROSS/BLUE SHIELD | Source: Ambulatory Visit | Attending: Radiation Oncology | Admitting: Radiation Oncology

## 2017-07-31 DIAGNOSIS — C3492 Malignant neoplasm of unspecified part of left bronchus or lung: Secondary | ICD-10-CM | POA: Diagnosis not present

## 2017-08-01 NOTE — Progress Notes (Signed)
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 - Patient has a scattered wheezing today, currently is on Qvar and is using albuterol inhaler 2-3 times per day. -We will add Anoro inhaler once daily, asked to use this in addition to Qvar.  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  Nicotine abuse. - Patient had been smoking a few cigarettes per day since his diagnosis, he now thinks that he has stopped completely as of 3 days ago. -Discussed the importance of continued smoking cessation, spent greater than 3 minutes in discussion.  Meds ordered this encounter  Medications  . umeclidinium-vilanterol (ANORO ELLIPTA) 62.5-25 MCG/INH AEPB    Sig: Inhale 1 puff into the lungs daily. Take in addition to Qvar.    Dispense:  1 each    Refill:  5   Return in about 6 months (around 02/01/2018).   Date: 08/04/2017  MRN# [id number] Gabriel Mendez [birth year]   Gabriel Mendez is a 57 y.o. old male seen in follow up for chief complaint of  Chief Complaint  Patient presents with  . Lung Mass    pt here for f/u had bronch 05/12/17; Pt had CT scan. Pt just completed chemo/radiation tx.  . Shortness of Breath    with exhertion  . Cough    mostly non productive  . 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 He has just recently completed chemo/rads, he feels tired. He feels that his breathing is short with mild to moderate activity. 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  He is not sleepy during the day.  He is on qvar 1 puff bid and rinses mouth, albuterol 2-3 times per day.  He has been trying to quit, and last smoked 3 days ago, even before that he was smoking only one here and there.   Imaging personally reviewed, PET scan 05/27/17, right upper lobe mass,  mediastinal lymphadenopathy, mild emphysematous changes in the apices.   Medication:    Current Outpatient Medications:  .  albuterol (PROVENTIL HFA;VENTOLIN HFA) 108 (90 BASE) MCG/ACT inhaler, Inhale 2 puffs into the lungs every 6 (six) hours as needed for wheezing or shortness of breath., Disp: , Rfl:  .  albuterol (PROVENTIL) (2.5 MG/3ML) 0.083% nebulizer solution, Take 2.5 mg every 6 (six) hours as needed by nebulization for wheezing or shortness of breath., Disp: , Rfl:  .  beclomethasone (QVAR) 80 MCG/ACT inhaler, Inhale 1 puff into the lungs 2 (two) times daily., Disp: , Rfl:  .  glipiZIDE (GLUCOTROL) 5 MG tablet, Take 5 mg by mouth daily before breakfast., Disp: , Rfl:  .  ibuprofen (ADVIL,MOTRIN) 200 MG tablet, Take 400-800 mg every 8 (eight) hours as needed by mouth (for pain.)., Disp: , Rfl:  .  lisinopril-hydrochlorothiazide (PRINZIDE,ZESTORETIC) 20-25 MG tablet, Take 1 tablet by mouth daily., Disp: , Rfl:  .  metFORMIN (GLUCOPHAGE) 1000 MG tablet, Take 1,000 mg by mouth 2 (two) times daily., Disp: , Rfl:  .  omeprazole (PRILOSEC) 20 MG capsule, Take 1 capsule (20 mg total) by mouth daily., Disp: 30 capsule, Rfl: 2 .  ondansetron (ZOFRAN) 8 MG tablet, Take 1 tablet (8 mg total) by mouth 2 (two) times daily as needed for refractory  nausea / vomiting., Disp: 30 tablet, Rfl: 2 .  prochlorperazine (COMPAZINE) 10 MG tablet, Take 1 tablet (10 mg total) by mouth every 6 (six) hours as needed (Nausea or vomiting)., Disp: 60 tablet, Rfl: 2 .  sucralfate (CARAFATE) 1 g tablet, Take 1 tablet (1 g total) by mouth 3 (three) times daily. Dissolve in 2-3 tbsp warm water, swish and swallow., Disp: 90 tablet, Rfl: 3 .  traMADol (ULTRAM) 50 MG tablet, Take 1 tablet (50 mg total) by mouth every 6 (six) hours as needed., Disp: 30 tablet, Rfl: 0   Allergies:  Shrimp [shellfish allergy]  Review of Systems: Gen:  Denies  fever, sweats. HEENT: Denies blurred vision. Cvc:  No dizziness, chest pain or  heaviness Resp:   Denies cough or sputum porduction. Gi: Denies swallowing difficulty, stomach pain. constipation, bowel incontinence Gu:  Denies bladder incontinence, burning urine Ext:   No Joint pain, stiffness. Skin: No skin rash, easy bruising. Endoc:  No polyuria, polydipsia. Psych: No depression, insomnia. Other:  All other systems were reviewed and found to be negative other than what is mentioned in the HPI.   Physical Examination:   VS: BP (!) 142/90 (BP Location: Left Arm, Cuff Size: Large)   Pulse (!) 113   Resp 16   Ht 5\' 8"  (1.727 m)   Wt 213 lb (96.6 kg)   SpO2 100%   BMI 32.39 kg/m   General Appearance: No distress  Neuro:without focal findings,  speech normal,  HEENT: PERRLA, EOM intact. Pulmonary: Scattered wheezing.   CardiovascularNormal S1,S2.  No m/r/g.   Abdomen: Benign, Soft, non-tender. Renal:  No costovertebral tenderness  GU:  Not performed at this time. Endoc: No evident thyromegaly, no signs of acromegaly. Skin:   warm, no rash. Extremities: normal, no cyanosis, clubbing.   LABORATORY PANEL:   CBC No results for input(s): WBC, HGB, HCT, PLT in the last 168 hours. ------------------------------------------------------------------------------------------------------------------  Chemistries  No results for input(s): NA, K, CL, CO2, GLUCOSE, BUN, CREATININE, CALCIUM, MG, AST, ALT, ALKPHOS, BILITOT in the last 168 hours.  Invalid input(s): GFRCGP ------------------------------------------------------------------------------------------------------------------  Cardiac Enzymes No results for input(s): TROPONINI in the last 168 hours. ------------------------------------------------------------  RADIOLOGY:   No results found for this or any previous visit. No results found for this or any previous visit. ------------------------------------------------------------------------------------------------------------------  Thank  you for  allowing [facility] to assist in the care of your patient. Our recommendations are noted above.  Please contact us if we can be of further service.   Marda Stalker, MD.  [facility] Office Number: [phone]  Patricia Pesa, M.D.  Merton Border, M.D  08/04/2017

## 2017-08-04 ENCOUNTER — Encounter: Payer: Self-pay | Admitting: Internal Medicine

## 2017-08-04 ENCOUNTER — Ambulatory Visit (INDEPENDENT_AMBULATORY_CARE_PROVIDER_SITE_OTHER): Payer: BLUE CROSS/BLUE SHIELD | Admitting: Internal Medicine

## 2017-08-04 VITALS — BP 142/90 | HR 113 | Resp 16 | Ht 68.0 in | Wt 213.0 lb

## 2017-08-04 DIAGNOSIS — J42 Unspecified chronic bronchitis: Secondary | ICD-10-CM | POA: Diagnosis not present

## 2017-08-04 DIAGNOSIS — J181 Lobar pneumonia, unspecified organism: Secondary | ICD-10-CM | POA: Diagnosis not present

## 2017-08-04 MED ORDER — UMECLIDINIUM-VILANTEROL 62.5-25 MCG/INH IN AEPB: 1.0000 | INHALATION_SPRAY | Freq: Every day | RESPIRATORY_TRACT | 5 refills | Status: DC

## 2017-08-04 NOTE — Patient Instructions (Addendum)
Continue to not smoke.  Continue qvar, add Anoro, call us if this if this is too expensive.

## 2017-09-05 ENCOUNTER — Other Ambulatory Visit: Payer: Self-pay | Admitting: Oncology

## 2017-09-05 DIAGNOSIS — C3492 Malignant neoplasm of unspecified part of left bronchus or lung: Secondary | ICD-10-CM

## 2017-09-07 NOTE — Progress Notes (Signed)
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  INTERVAL HISTORY: Patient returns to clinic today for further evaluation, discussion of his imaging results, and initiation of maintenance Durvalumab.  He currently feels well and is asymptomatic.  He has no neurologic complaints. He has a good appetite and denies weight loss.  He denies any chest pain, shortness of breath, or hemoptysis.  He has an occasional cough.  He has no nausea, vomiting, constipation, or diarrhea.  He has no urinary complaints.  Patient offers no further specific complaints today.  REVIEW OF SYSTEMS:   Review of Systems  Constitutional: Negative.  Negative for fever, malaise/fatigue and weight loss.  HENT: Negative for congestion.   Respiratory: Positive for cough. Negative for hemoptysis and shortness of breath.   Cardiovascular: Negative.  Negative for chest pain and leg swelling.  Gastrointestinal: Negative for abdominal pain, heartburn, nausea and vomiting.  Genitourinary: Negative.   Musculoskeletal: Positive for joint pain.  Skin: Negative.  Negative for rash.  Neurological: Negative.  Negative for sensory change, focal weakness and weakness.  Psychiatric/Behavioral: Negative.  The patient is not nervous/anxious.     As per HPI. Otherwise, a complete review of systems is negative.  PAST MEDICAL HISTORY: Past Medical History:  Diagnosis Date  . COPD (chronic obstructive pulmonary disease) ([place])   . Diabetes mellitus without complication ([place])   . Hypertension   . Pneumonia     PAST SURGICAL HISTORY: Past Surgical History:  Procedure Laterality Date  . ENDOBRONCHIAL ULTRASOUND N/A 05/12/2017   Procedure:  ENDOBRONCHIAL ULTRASOUND;  Surgeon: Laverle Hobby, MD;  Location: [facility] ORS;  Service: Pulmonary;  Laterality: N/A;  . FRACTURE SURGERY Left    ANKLE X 2  . TONSILLECTOMY      FAMILY HISTORY: Family History  Problem Relation Age of Onset  . Stroke Mother   . Diabetes Mother   . Hypertension Mother   . Diabetes Father   . Hypertension Father   . Diabetes Sister   . Diabetes Brother   . Heart attack Paternal Uncle   . Stroke Maternal Grandmother     ADVANCED DIRECTIVES (Y/N):  N  HEALTH MAINTENANCE: Social History   Tobacco Use  . Smoking status: Former Smoker    Packs/day: 1.50    Last attempt to quit: 07/31/2017    Years since quitting: 0.1  . Smokeless tobacco: Never Used  Substance Use Topics  . Alcohol use: No  . Drug use: No     Colonoscopy:  PAP:  Bone density:  Lipid panel:  Allergies  Allergen Reactions  . Shrimp [Shellfish Allergy] Nausea And Vomiting    Current Outpatient Medications  Medication Sig Dispense Refill  . albuterol (PROVENTIL HFA;VENTOLIN HFA) 108 (90 BASE) MCG/ACT inhaler Inhale 2 puffs into the lungs every 6 (six) hours as needed for wheezing or shortness of breath.    Marland Kitchen albuterol (PROVENTIL) (2.5 MG/3ML) 0.083% nebulizer solution Take 2.5 mg every 6 (six) hours as needed by nebulization for wheezing or shortness of breath.    . beclomethasone (QVAR) 80 MCG/ACT inhaler Inhale 1 puff into the lungs 2 (two) times daily.    Marland Kitchen glipiZIDE (GLUCOTROL) 5 MG tablet Take 5 mg by mouth daily before breakfast.    . ibuprofen (  ADVIL,MOTRIN) 200 MG tablet Take 400-800 mg every 8 (eight) hours as needed by mouth (for pain.).    Marland Kitchen lisinopril-hydrochlorothiazide (PRINZIDE,ZESTORETIC) 20-25 MG tablet Take 1 tablet by mouth daily.    . metFORMIN (GLUCOPHAGE) 1000 MG tablet Take 1,000 mg by mouth 2 (two) times daily.    Marland Kitchen omeprazole (PRILOSEC) 20 MG capsule Take 1 capsule (20 mg total) by mouth daily. 30 capsule 2  . sucralfate (CARAFATE) 1 g tablet  Take 1 tablet (1 g total) by mouth 3 (three) times daily. Dissolve in 2-3 tbsp warm water, swish and swallow. (Patient not taking: Reported on 09/11/2017) 90 tablet 3  . traMADol (ULTRAM) 50 MG tablet Take 1 tablet (50 mg total) by mouth every 6 (six) hours as needed. 30 tablet 0  . umeclidinium-vilanterol (ANORO ELLIPTA) 62.5-25 MCG/INH AEPB Inhale 1 puff into the lungs daily. Take in addition to Qvar. 1 each 5   No current facility-administered medications for this visit.     OBJECTIVE: There were no vitals filed for this visit.   There is no height or weight on file to calculate BMI.    ECOG FS:0 - Asymptomatic  General: Well-developed, well-nourished, no acute distress. Eyes: Pink conjunctiva, anicteric sclera. Lungs: Clear to auscultation bilaterally. Heart: Regular rate and rhythm. No rubs, murmurs, or gallops. Abdomen: Soft, nontender, nondistended. No organomegaly noted, normoactive bowel sounds. Musculoskeletal: No edema, cyanosis, or clubbing. Neuro: Alert, answering all questions appropriately. Cranial nerves grossly intact. Skin: No rashes or petechiae noted. Psych: Normal affect.  LAB RESULTS:  Lab Results  Component Value Date   NA 135 09/11/2017   K 3.8 09/11/2017   CL 101 09/11/2017   CO2 24 09/11/2017   GLUCOSE 187 (H) 09/11/2017   BUN 10 09/11/2017   CREATININE 0.67 09/11/2017   CALCIUM 9.5 09/11/2017   PROT 7.0 09/11/2017   ALBUMIN 3.9 09/11/2017   AST 20 09/11/2017   ALT 18 09/11/2017   ALKPHOS 97 09/11/2017   BILITOT 0.6 09/11/2017   GFRNONAA >60 09/11/2017   GFRAA >60 09/11/2017    Lab Results  Component Value Date   WBC 8.5 09/11/2017   NEUTROABS 6.7 (H) 09/11/2017   HGB 14.9 09/11/2017   HCT 42.6 09/11/2017   MCV 94.0 09/11/2017   PLT 249 09/11/2017     STUDIES: Nm Pet Image Restag (ps) Skull Base To Thigh  Result Date: 09/08/2017 CLINICAL DATA:  Subsequent treatment strategy for squamous cell carcinoma of the lung. EXAM: NUCLEAR MEDICINE  PET SKULL BASE TO THIGH TECHNIQUE: 10.38 mCi F-18 FDG was injected intravenously. Full-ring PET imaging was performed from the skull base to thigh after the radiotracer. CT data was obtained and used for attenuation correction and anatomic localization. Fasting blood glucose: 174 mg/dl Mediastinal blood pool activity: SUV max 2.88 COMPARISON:  05/27/2017 FINDINGS: NECK: Stable lesion in the left parotid lobe which is mildly hypermetabolic with SUV max of 4.6. No neck adenopathy. Incidental CT findings: none CHEST: Excellent response to treatment is demonstrated. There are radiation changes involving the right lung and hilar regions. No residual measurable tumor. There is some residual hypermetabolism in the right hilum with SUV max of 3.6. Bulky markedly hypermetabolic mediastinal and hilar adenopathy shows significant improvement. Small residual lymph nodes are noted but no hypermetabolism. No new worrisome pulmonary nodules to suggest metastatic disease. Incidental CT findings: Stable coronary artery calcifications. ABDOMEN/PELVIS: No abnormal hypermetabolic activity within the liver, pancreas, adrenal glands, or spleen. No hypermetabolic lymph nodes in the abdomen or pelvis. Incidental CT  findings: none SKELETON: No focal hypermetabolic activity to suggest skeletal metastasis. Incidental CT findings: none IMPRESSION: 1. Excellent response to treatment. Minimal residual hypermetabolism in the right hilar region but no discrete measurable mass. The adenopathy has significantly regressed and is no longer hypermetabolic. 2. No findings for metastatic disease. 3. Stable left parotid lesion. Electronically Signed   By: Marijo Sanes M.D.   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  PLAN:    1. 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  Patient was also discussed at cancer conference.  MRI of the brain  is negative. PET scan results from September 08, 2017 reviewed independently and report as above with excellent response to treatment.  Proceed with maintenance immunotherapy with Imfinzi every 2 weeks for 12 months.  Patient has declined port placement at this time, but will reconsider in the future.  Return to clinic in 2 weeks for further evaluation and consideration of cycle 2 of Durvalumab. 2.  Hyperglycemia: Monitor.  Patient is no longer receiving dexamethasone as a premedication.  3.  Indigestion: Patient does not complain of this today.  Continue omeprazole as needed. 4.  Pain: Continue tramadol as needed. 5.  Cough/congestion: Continue OTC treatments as needed. 6.  Hypertension: Patient's blood pressure is elevated today.  Continue current treatment as prescribed.  Patient expressed understanding and was in agreement with this plan. He also understands that He can call clinic at any time with any questions, concerns, or complaints.   Cancer Staging Squamous cell lung cancer, left [facility]) Staging form: Lung, AJCC 8th Edition - Clinical stage from 05/16/2017: Stage IIIB (cT3, cN2, cM0) - Signed by Lloyd Huger, MD on 05/16/2017   Lloyd Huger, MD   09/14/2017 9:07 PM

## 2017-09-08 ENCOUNTER — Ambulatory Visit
Admission: RE | Admit: 2017-09-08 | Discharge: 2017-09-08 | Disposition: A | Discharge status: Home / Self Care | Payer: BLUE CROSS/BLUE SHIELD | Source: Ambulatory Visit | Attending: Oncology | Admitting: Oncology

## 2017-09-08 DIAGNOSIS — C3492 Malignant neoplasm of unspecified part of left bronchus or lung: Secondary | ICD-10-CM | POA: Diagnosis not present

## 2017-09-08 DIAGNOSIS — K118 Other diseases of salivary glands: Secondary | ICD-10-CM | POA: Diagnosis not present

## 2017-09-08 LAB — GLUCOSE, CAPILLARY: GLUCOSE-CAPILLARY: 174 mg/dL — AB (ref 65–99)

## 2017-09-08 MED ORDER — FLUDEOXYGLUCOSE F - 18 (FDG) INJECTION
10.3800 | Freq: Once | INTRAVENOUS | Status: AC | PRN
  Administered 2017-09-08: 10.38 via INTRAVENOUS

## 2017-09-11 ENCOUNTER — Other Ambulatory Visit: Payer: Self-pay

## 2017-09-11 ENCOUNTER — Inpatient Hospital Stay: Payer: BLUE CROSS/BLUE SHIELD

## 2017-09-11 ENCOUNTER — Ambulatory Visit
Admission: RE | Admit: 2017-09-11 | Discharge: 2017-09-11 | Disposition: A | Discharge status: Home / Self Care | Payer: BLUE CROSS/BLUE SHIELD | Source: Ambulatory Visit | Attending: Radiation Oncology | Admitting: Radiation Oncology

## 2017-09-11 ENCOUNTER — Inpatient Hospital Stay (HOSPITAL_BASED_OUTPATIENT_CLINIC_OR_DEPARTMENT_OTHER): Payer: BLUE CROSS/BLUE SHIELD | Admitting: Oncology

## 2017-09-11 ENCOUNTER — Inpatient Hospital Stay: Payer: BLUE CROSS/BLUE SHIELD | Attending: Oncology

## 2017-09-11 ENCOUNTER — Encounter: Payer: Self-pay | Admitting: Radiation Oncology

## 2017-09-11 VITALS — BP 148/88 | HR 86 | Resp 20

## 2017-09-11 VITALS — BP 191/99 | HR 89 | Temp 97.4°F | Resp 22 | Wt 224.8 lb

## 2017-09-11 DIAGNOSIS — Z7984 Long term (current) use of oral hypoglycemic drugs: Secondary | ICD-10-CM | POA: Insufficient documentation

## 2017-09-11 DIAGNOSIS — J449 Chronic obstructive pulmonary disease, unspecified: Secondary | ICD-10-CM | POA: Diagnosis not present

## 2017-09-11 DIAGNOSIS — Z79899 Other long term (current) drug therapy: Secondary | ICD-10-CM | POA: Diagnosis not present

## 2017-09-11 DIAGNOSIS — C3491 Malignant neoplasm of unspecified part of right bronchus or lung: Secondary | ICD-10-CM | POA: Insufficient documentation

## 2017-09-11 DIAGNOSIS — Z8701 Personal history of pneumonia (recurrent): Secondary | ICD-10-CM

## 2017-09-11 DIAGNOSIS — Z87891 Personal history of nicotine dependence: Secondary | ICD-10-CM

## 2017-09-11 DIAGNOSIS — Z923 Personal history of irradiation: Secondary | ICD-10-CM | POA: Diagnosis not present

## 2017-09-11 DIAGNOSIS — I10 Essential (primary) hypertension: Secondary | ICD-10-CM | POA: Diagnosis not present

## 2017-09-11 DIAGNOSIS — C3492 Malignant neoplasm of unspecified part of left bronchus or lung: Secondary | ICD-10-CM

## 2017-09-11 DIAGNOSIS — Z5112 Encounter for antineoplastic immunotherapy: Secondary | ICD-10-CM | POA: Insufficient documentation

## 2017-09-11 DIAGNOSIS — E1165 Type 2 diabetes mellitus with hyperglycemia: Secondary | ICD-10-CM

## 2017-09-11 DIAGNOSIS — K118 Other diseases of salivary glands: Secondary | ICD-10-CM | POA: Insufficient documentation

## 2017-09-11 DIAGNOSIS — R590 Localized enlarged lymph nodes: Secondary | ICD-10-CM | POA: Diagnosis not present

## 2017-09-11 LAB — COMPREHENSIVE METABOLIC PANEL
ALBUMIN: 3.9 g/dL (ref 3.5–5.0)
ALK PHOS: 97 U/L (ref 38–126)
ALT: 18 U/L (ref 17–63)
ANION GAP: 10 (ref 5–15)
AST: 20 U/L (ref 15–41)
BUN: 10 mg/dL (ref 6–20)
CO2: 24 mmol/L (ref 22–32)
Calcium: 9.5 mg/dL (ref 8.9–10.3)
Chloride: 101 mmol/L (ref 101–111)
Creatinine, Ser: 0.67 mg/dL (ref 0.61–1.24)
GFR calc non Af Amer: >60 mL/min (ref >60)
Glucose, Bld: 187 mg/dL — ABNORMAL HIGH (ref 65–99)
POTASSIUM: 3.8 mmol/L (ref 3.5–5.1)
SODIUM: 135 mmol/L (ref 135–145)
Total Bilirubin: 0.6 mg/dL (ref 0.3–1.2)
Total Protein: 7 g/dL (ref 6.5–8.1)

## 2017-09-11 LAB — CBC WITH DIFFERENTIAL/PLATELET
BASOS ABS: 0.1 10*3/uL (ref 0–0.1)
BASOS PCT: 1 %
Eosinophils Absolute: 0.3 10*3/uL (ref 0–0.7)
Eosinophils Relative: 4 %
HEMATOCRIT: 42.6 % (ref 40.0–52.0)
HEMOGLOBIN: 14.9 g/dL (ref 13.0–18.0)
LYMPHS PCT: 7 %
Lymphs Abs: 0.6 10*3/uL — ABNORMAL LOW (ref 1.0–3.6)
MCH: 32.8 pg (ref 26.0–34.0)
MCHC: 34.9 g/dL (ref 32.0–36.0)
MCV: 94 fL (ref 80.0–100.0)
MONOS PCT: 10 %
Monocytes Absolute: 0.8 10*3/uL (ref 0.2–1.0)
NEUTROS ABS: 6.7 10*3/uL — AB (ref 1.4–6.5)
NEUTROS PCT: 78 %
Platelets: 249 10*3/uL (ref 150–440)
RBC: 4.54 MIL/uL (ref 4.40–5.90)
RDW: 15.5 % — ABNORMAL HIGH (ref 11.5–14.5)
WBC: 8.5 10*3/uL (ref 3.8–10.6)

## 2017-09-11 MED ORDER — SODIUM CHLORIDE 0.9 % IV SOLN
Freq: Once | INTRAVENOUS | Status: AC
  Administered 2017-09-11: 10:00:00 via INTRAVENOUS
  Filled 2017-09-11: qty 1000

## 2017-09-11 MED ORDER — SODIUM CHLORIDE 0.9 % IV SOLN
1000.0000 mg | Freq: Once | INTRAVENOUS | Status: AC
  Administered 2017-09-11: 1000 mg via INTRAVENOUS
  Filled 2017-09-11: qty 20

## 2017-09-11 NOTE — Progress Notes (Signed)
Patient denies concerns today.

## 2017-09-11 NOTE — Progress Notes (Signed)
Radiation Oncology Follow up Note  Name: Gabriel Mendez   Date:   09/11/2017 MRN:  [id number] DOB: 10/16/1960    This 57 y.o. 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  REFERRING PROVIDER: Arliss Journey*  HPI: patient is a 57 year old male now seen out 1 month having completed concurrent chemoradiation Center therapy for stage IIIB.squamous cell carcinoma the right lung. He is seen today in routine follow-up and is doing well. He specifically denies cough hemoptysis or chest tightness.he recently had a restaging PET/CT scan showing excellent response to treatment with minimal residual hypermetabolic activity in the right hilar region with no discrete measurable mass. The adenopathy had significantly regressed and no longer hypermetabolic.he is scheduled to start [place] which according to his stage she will benefit from. He is otherwise doing well    COMPLICATIONS OF TREATMENT: none  FOLLOW UP COMPLIANCE: keeps appointments   PHYSICAL EXAM:  BP (!) 191/99   Pulse 89   Temp (!) 97.4 F (36.3 C)   Resp (!) 22   Wt 224 lb 12.1 oz (101.9 kg)   BMI 34.17 kg/m  Well-developed well-nourished patient in NAD. HEENT reveals PERLA, EOMI, discs not visualized.  Oral cavity is clear. No oral mucosal lesions are identified. Neck is clear without evidence of cervical or supraclavicular adenopathy. Lungs are clear to A&P. Cardiac examination is essentially unremarkable with regular rate and rhythm without murmur rub or thrill. Abdomen is benign with no organomegaly or masses noted. Motor sensory and DTR levels are equal and symmetric in the upper and lower extremities. Cranial nerves II through XII are grossly intact. Proprioception is intact. No peripheral adenopathy or edema is identified. No motor or sensory levels are noted. Crude visual fields are within normal  range.  RADIOLOGY RESULTS:PET CT scan reviewed and compatible with the above-stated findings consistent with excellent response to treatment  PLAN: this time patient continues to do well has an excellent response by PET CT criteria. He will start immunotherapy under medical oncology's direction. I've asked to see him back in 3 months for follow-up. Patient is to call sooner with any concerns.  I would like to take this opportunity to thank you for allowing me to participate in the care of your patient.[name], MD

## 2017-09-12 LAB — THYROID PANEL WITH TSH
FREE THYROXINE INDEX: 1.9 (ref 1.2–4.9)
T3 UPTAKE RATIO: 22 % — AB (ref 24–39)
T4 TOTAL: 8.7 ug/dL (ref 4.5–12.0)
TSH: 1.32 u[IU]/mL (ref 0.450–4.500)

## 2017-09-21 NOTE — Progress Notes (Signed)
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  INTERVAL HISTORY: Patient returns to clinic today for further evaluation and consideration of cycle 2 of maintenance Durvalumab.  He has increased fatigue, but is still active and working full-time.  He otherwise feels well. He has no neurologic complaints. He has a good appetite and denies weight loss.  He denies any chest pain, shortness of breath, or hemoptysis.  He has an occasional cough.  He has no nausea, vomiting, constipation, or diarrhea.  He has no urinary complaints.  Patient offers no further specific complaints today.  REVIEW OF SYSTEMS:   Review of Systems  Constitutional: Positive for malaise/fatigue. Negative for fever and weight loss.  HENT: Negative for congestion.   Respiratory: Positive for cough. Negative for hemoptysis and shortness of breath.   Cardiovascular: Negative.  Negative for chest pain and leg swelling.  Gastrointestinal: Negative.  Negative for abdominal pain, diarrhea, heartburn, nausea and vomiting.  Genitourinary: Negative.   Musculoskeletal: Negative.  Negative for joint pain.  Skin: Negative.  Negative for rash.  Neurological: Negative.  Negative for sensory change, focal weakness and weakness.  Psychiatric/Behavioral: Negative.  The patient is not nervous/anxious.     As per HPI. Otherwise, a complete review of systems is negative.  PAST MEDICAL HISTORY: Past Medical History:  Diagnosis Date  . COPD (chronic obstructive pulmonary disease) ([place])   . Diabetes mellitus without complication ([place])   . Hypertension   . Pneumonia     PAST SURGICAL HISTORY: Past Surgical History:  Procedure Laterality Date  .  ENDOBRONCHIAL ULTRASOUND N/A 05/12/2017   Procedure: ENDOBRONCHIAL ULTRASOUND;  Surgeon: Laverle Hobby, MD;  Location: [facility] ORS;  Service: Pulmonary;  Laterality: N/A;  . FRACTURE SURGERY Left    ANKLE X 2  . TONSILLECTOMY      FAMILY HISTORY: Family History  Problem Relation Age of Onset  . Stroke Mother   . Diabetes Mother   . Hypertension Mother   . Diabetes Father   . Hypertension Father   . Diabetes Sister   . Diabetes Brother   . Heart attack Paternal Uncle   . Stroke Maternal Grandmother     ADVANCED DIRECTIVES (Y/N):  N  HEALTH MAINTENANCE: Social History   Tobacco Use  . Smoking status: Former Smoker    Packs/day: 1.50    Last attempt to quit: 07/31/2017    Years since quitting: 0.1  . Smokeless tobacco: Never Used  Substance Use Topics  . Alcohol use: No  . Drug use: No     Colonoscopy:  PAP:  Bone density:  Lipid panel:  Allergies  Allergen Reactions  . Shrimp [Shellfish Allergy] Nausea And Vomiting    Current Outpatient Medications  Medication Sig Dispense Refill  . albuterol (PROVENTIL HFA;VENTOLIN HFA) 108 (90 BASE) MCG/ACT inhaler Inhale 2 puffs into the lungs every 6 (six) hours as needed for wheezing or shortness of breath.    Marland Kitchen albuterol (PROVENTIL) (2.5 MG/3ML) 0.083% nebulizer solution Take 2.5 mg every 6 (six) hours as needed by nebulization for wheezing or shortness of breath.    . beclomethasone (QVAR) 80 MCG/ACT inhaler Inhale 1 puff into the lungs 2 (two) times daily.    Marland Kitchen glipiZIDE (GLUCOTROL) 5 MG tablet Take 5  mg by mouth daily before breakfast.    . ibuprofen (ADVIL,MOTRIN) 200 MG tablet Take 400-800 mg every 8 (eight) hours as needed by mouth (for pain.).    Marland Kitchen lisinopril-hydrochlorothiazide (PRINZIDE,ZESTORETIC) 20-25 MG tablet Take 1 tablet by mouth daily.    . metFORMIN (GLUCOPHAGE) 1000 MG tablet Take 1,000 mg by mouth 2 (two) times daily.    Marland Kitchen omeprazole (PRILOSEC) 20 MG capsule Take 1 capsule (20 mg total) by mouth  daily. 30 capsule 2  . sucralfate (CARAFATE) 1 g tablet Take 1 tablet (1 g total) by mouth 3 (three) times daily. Dissolve in 2-3 tbsp warm water, swish and swallow. 90 tablet 3  . traMADol (ULTRAM) 50 MG tablet Take 1 tablet (50 mg total) by mouth every 6 (six) hours as needed. 30 tablet 0  . umeclidinium-vilanterol (ANORO ELLIPTA) 62.5-25 MCG/INH AEPB Inhale 1 puff into the lungs daily. Take in addition to Qvar. 1 each 5   No current facility-administered medications for this visit.     OBJECTIVE: Vitals:   09/25/17 0917 09/25/17 0922  BP:  (!) 165/106  Pulse:  93  Resp: 16   Temp:  98 F (36.7 C)     Body mass index is 34 kg/m.    ECOG FS:0 - Asymptomatic  General: Well-developed, well-nourished, no acute distress. Eyes: Pink conjunctiva, anicteric sclera. Lungs: Clear to auscultation bilaterally. Heart: Regular rate and rhythm. No rubs, murmurs, or gallops. Abdomen: Soft, nontender, nondistended. No organomegaly noted, normoactive bowel sounds. Musculoskeletal: No edema, cyanosis, or clubbing. Neuro: Alert, answering all questions appropriately. Cranial nerves grossly intact. Skin: No rashes or petechiae noted. Psych: Normal affect.  LAB RESULTS:  Lab Results  Component Value Date   NA 136 09/25/2017   K 4.0 09/25/2017   CL 103 09/25/2017   CO2 24 09/25/2017   GLUCOSE 146 (H) 09/25/2017   BUN 16 09/25/2017   CREATININE 0.84 09/25/2017   CALCIUM 9.2 09/25/2017   PROT 7.0 09/25/2017   ALBUMIN 3.9 09/25/2017   AST 19 09/25/2017   ALT 19 09/25/2017   ALKPHOS 107 09/25/2017   BILITOT 0.4 09/25/2017   GFRNONAA >60 09/25/2017   GFRAA >60 09/25/2017    Lab Results  Component Value Date   WBC 8.3 09/25/2017   NEUTROABS 6.4 09/25/2017   HGB 14.8 09/25/2017   HCT 42.7 09/25/2017   MCV 93.1 09/25/2017   PLT 282 09/25/2017     STUDIES: Nm Pet Image Restag (ps) Skull Base To Thigh  Result Date: 09/08/2017 CLINICAL DATA:  Subsequent treatment strategy for  squamous cell carcinoma of the lung. EXAM: NUCLEAR MEDICINE PET SKULL BASE TO THIGH TECHNIQUE: 10.38 mCi F-18 FDG was injected intravenously. Full-ring PET imaging was performed from the skull base to thigh after the radiotracer. CT data was obtained and used for attenuation correction and anatomic localization. Fasting blood glucose: 174 mg/dl Mediastinal blood pool activity: SUV max 2.88 COMPARISON:  05/27/2017 FINDINGS: NECK: Stable lesion in the left parotid lobe which is mildly hypermetabolic with SUV max of 4.6. No neck adenopathy. Incidental CT findings: none CHEST: Excellent response to treatment is demonstrated. There are radiation changes involving the right lung and hilar regions. No residual measurable tumor. There is some residual hypermetabolism in the right hilum with SUV max of 3.6. Bulky markedly hypermetabolic mediastinal and hilar adenopathy shows significant improvement. Small residual lymph nodes are noted but no hypermetabolism. No new worrisome pulmonary nodules to suggest metastatic disease. Incidental CT findings: Stable coronary artery calcifications. ABDOMEN/PELVIS: No abnormal hypermetabolic  activity within the liver, pancreas, adrenal glands, or spleen. No hypermetabolic lymph nodes in the abdomen or pelvis. Incidental CT findings: none SKELETON: No focal hypermetabolic activity to suggest skeletal metastasis. Incidental CT findings: none IMPRESSION: 1. Excellent response to treatment. Minimal residual hypermetabolism in the right hilar region but no discrete measurable mass. The adenopathy has significantly regressed and is no longer hypermetabolic. 2. No findings for metastatic disease. 3. Stable left parotid lesion. Electronically Signed   By: Marijo Sanes M.D.   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  PLAN:    1. 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  Patient  was also discussed at cancer conference.  MRI of the brain is negative. PET scan results from September 08, 2017 reviewed independently and report as above with excellent response to treatment.  Proceed with cycle 2 of maintenance immunotherapy with Durvalumab every 2 weeks for 12 months.  Patient has declined port placement at this time.  Return to clinic in 2 weeks for further evaluation and consideration of cycle 3 of Durvalumab. 2.  Hyperglycemia:  Mild.  Patient is no longer receiving dexamethasone as a premedication.  3.  Indigestion: Continue him on omeprazole as prescribed. 4.  Pain: Patient does not complain of this today.  Continue tramadol as needed. 5.  Cough/congestion: Continue OTC treatments as needed. 6.  Hypertension: Patient's blood pressure remains persistently elevated.  Continue current treatment as prescribed. 7.  Fatigue: Likely secondary to treatment.  Patient's thyroid panel is essentially within normal limits.  Monitor.  Patient expressed understanding and was in agreement with this plan. He also understands that He can call clinic at any time with any questions, concerns, or complaints.   Cancer Staging Squamous cell lung cancer, left [facility]) Staging form: Lung, AJCC 8th Edition - Clinical stage from 05/16/2017: Stage IIIB (cT3, cN2, cM0) - Signed by Lloyd Huger, MD on 05/16/2017   Lloyd Huger, MD   09/27/2017 9:12 AM

## 2017-09-25 ENCOUNTER — Encounter: Payer: Self-pay | Admitting: Oncology

## 2017-09-25 ENCOUNTER — Other Ambulatory Visit: Payer: Self-pay

## 2017-09-25 ENCOUNTER — Inpatient Hospital Stay (HOSPITAL_BASED_OUTPATIENT_CLINIC_OR_DEPARTMENT_OTHER): Payer: BLUE CROSS/BLUE SHIELD | Admitting: Oncology

## 2017-09-25 ENCOUNTER — Inpatient Hospital Stay: Payer: BLUE CROSS/BLUE SHIELD

## 2017-09-25 ENCOUNTER — Inpatient Hospital Stay: Payer: BLUE CROSS/BLUE SHIELD | Attending: Oncology

## 2017-09-25 VITALS — BP 165/106 | HR 93 | Temp 98.0°F | Resp 16 | Ht 68.0 in | Wt 223.6 lb

## 2017-09-25 DIAGNOSIS — Z5111 Encounter for antineoplastic chemotherapy: Secondary | ICD-10-CM | POA: Diagnosis not present

## 2017-09-25 DIAGNOSIS — Z8701 Personal history of pneumonia (recurrent): Secondary | ICD-10-CM | POA: Diagnosis not present

## 2017-09-25 DIAGNOSIS — R05 Cough: Secondary | ICD-10-CM | POA: Diagnosis not present

## 2017-09-25 DIAGNOSIS — I10 Essential (primary) hypertension: Secondary | ICD-10-CM | POA: Diagnosis not present

## 2017-09-25 DIAGNOSIS — R0981 Nasal congestion: Secondary | ICD-10-CM | POA: Insufficient documentation

## 2017-09-25 DIAGNOSIS — J449 Chronic obstructive pulmonary disease, unspecified: Secondary | ICD-10-CM

## 2017-09-25 DIAGNOSIS — R5383 Other fatigue: Secondary | ICD-10-CM

## 2017-09-25 DIAGNOSIS — K30 Functional dyspepsia: Secondary | ICD-10-CM | POA: Diagnosis not present

## 2017-09-25 DIAGNOSIS — C3492 Malignant neoplasm of unspecified part of left bronchus or lung: Secondary | ICD-10-CM

## 2017-09-25 DIAGNOSIS — Z87891 Personal history of nicotine dependence: Secondary | ICD-10-CM

## 2017-09-25 DIAGNOSIS — Z79899 Other long term (current) drug therapy: Secondary | ICD-10-CM | POA: Diagnosis not present

## 2017-09-25 DIAGNOSIS — E1165 Type 2 diabetes mellitus with hyperglycemia: Secondary | ICD-10-CM | POA: Diagnosis not present

## 2017-09-25 LAB — CBC WITH DIFFERENTIAL/PLATELET
BASOS ABS: 0.1 10*3/uL (ref 0–0.1)
BASOS PCT: 1 %
EOS ABS: 0.3 10*3/uL (ref 0–0.7)
EOS PCT: 3 %
HCT: 42.7 % (ref 40.0–52.0)
Hemoglobin: 14.8 g/dL (ref 13.0–18.0)
Lymphocytes Relative: 10 %
Lymphs Abs: 0.8 10*3/uL — ABNORMAL LOW (ref 1.0–3.6)
MCH: 32.4 pg (ref 26.0–34.0)
MCHC: 34.8 g/dL (ref 32.0–36.0)
MCV: 93.1 fL (ref 80.0–100.0)
Monocytes Absolute: 0.7 10*3/uL (ref 0.2–1.0)
Monocytes Relative: 9 %
Neutro Abs: 6.4 10*3/uL (ref 1.4–6.5)
Neutrophils Relative %: 77 %
PLATELETS: 282 10*3/uL (ref 150–440)
RBC: 4.58 MIL/uL (ref 4.40–5.90)
RDW: 13.8 % (ref 11.5–14.5)
WBC: 8.3 10*3/uL (ref 3.8–10.6)

## 2017-09-25 LAB — COMPREHENSIVE METABOLIC PANEL
ALT: 19 U/L (ref 17–63)
AST: 19 U/L (ref 15–41)
Albumin: 3.9 g/dL (ref 3.5–5.0)
Alkaline Phosphatase: 107 U/L (ref 38–126)
Anion gap: 9 (ref 5–15)
BILIRUBIN TOTAL: 0.4 mg/dL (ref 0.3–1.2)
BUN: 16 mg/dL (ref 6–20)
CO2: 24 mmol/L (ref 22–32)
CREATININE: 0.84 mg/dL (ref 0.61–1.24)
Calcium: 9.2 mg/dL (ref 8.9–10.3)
Chloride: 103 mmol/L (ref 101–111)
GFR calc Af Amer: >60 mL/min (ref >60)
Glucose, Bld: 146 mg/dL — ABNORMAL HIGH (ref 65–99)
Potassium: 4 mmol/L (ref 3.5–5.1)
Sodium: 136 mmol/L (ref 135–145)
TOTAL PROTEIN: 7 g/dL (ref 6.5–8.1)

## 2017-09-25 MED ORDER — SODIUM CHLORIDE 0.9 % IV SOLN
1000.0000 mg | Freq: Once | INTRAVENOUS | Status: AC
  Administered 2017-09-25: 1000 mg via INTRAVENOUS
  Filled 2017-09-25: qty 20

## 2017-09-25 MED ORDER — SODIUM CHLORIDE 0.9 % IV SOLN
Freq: Once | INTRAVENOUS | Status: AC
  Administered 2017-09-25: 10:00:00 via INTRAVENOUS
  Filled 2017-09-25: qty 1000

## 2017-09-25 NOTE — Progress Notes (Signed)
Patient here for treatment. He is feeling very tired.

## 2017-09-26 LAB — THYROID PANEL WITH TSH
FREE THYROXINE INDEX: 2.1 (ref 1.2–4.9)
T3 Uptake Ratio: 22 % — ABNORMAL LOW (ref 24–39)
T4, Total: 9.4 ug/dL (ref 4.5–12.0)
TSH: 0.824 u[IU]/mL (ref 0.450–4.500)

## 2017-10-09 ENCOUNTER — Inpatient Hospital Stay: Payer: BLUE CROSS/BLUE SHIELD

## 2017-10-09 VITALS — BP 155/104 | HR 94 | Temp 97.2°F | Ht 68.0 in | Wt 228.8 lb

## 2017-10-09 DIAGNOSIS — C3492 Malignant neoplasm of unspecified part of left bronchus or lung: Secondary | ICD-10-CM

## 2017-10-09 LAB — CBC WITH DIFFERENTIAL/PLATELET
Basophils Absolute: 0.1 10*3/uL (ref 0–0.1)
Basophils Relative: 1 %
EOS ABS: 0.3 10*3/uL (ref 0–0.7)
EOS PCT: 4 %
HCT: 42 % (ref 40.0–52.0)
Hemoglobin: 14.8 g/dL (ref 13.0–18.0)
LYMPHS ABS: 0.7 10*3/uL — AB (ref 1.0–3.6)
LYMPHS PCT: 9 %
MCH: 33 pg (ref 26.0–34.0)
MCHC: 35.2 g/dL (ref 32.0–36.0)
MCV: 93.6 fL (ref 80.0–100.0)
MONOS PCT: 10 %
Monocytes Absolute: 0.8 10*3/uL (ref 0.2–1.0)
Neutro Abs: 6 10*3/uL (ref 1.4–6.5)
Neutrophils Relative %: 76 %
PLATELETS: 251 10*3/uL (ref 150–440)
RBC: 4.49 MIL/uL (ref 4.40–5.90)
RDW: 13 % (ref 11.5–14.5)
WBC: 7.9 10*3/uL (ref 3.8–10.6)

## 2017-10-09 LAB — COMPREHENSIVE METABOLIC PANEL
ALBUMIN: 3.8 g/dL (ref 3.5–5.0)
ALT: 22 U/L (ref 17–63)
AST: 22 U/L (ref 15–41)
Alkaline Phosphatase: 94 U/L (ref 38–126)
Anion gap: 10 (ref 5–15)
BUN: 13 mg/dL (ref 6–20)
CHLORIDE: 100 mmol/L — AB (ref 101–111)
CO2: 25 mmol/L (ref 22–32)
CREATININE: 0.76 mg/dL (ref 0.61–1.24)
Calcium: 9.2 mg/dL (ref 8.9–10.3)
GFR calc Af Amer: >60 mL/min (ref >60)
GLUCOSE: 184 mg/dL — AB (ref 65–99)
POTASSIUM: 4.1 mmol/L (ref 3.5–5.1)
SODIUM: 135 mmol/L (ref 135–145)
Total Bilirubin: 0.3 mg/dL (ref 0.3–1.2)
Total Protein: 6.8 g/dL (ref 6.5–8.1)

## 2017-10-09 MED ORDER — SODIUM CHLORIDE 0.9 % IV SOLN
1000.0000 mg | Freq: Once | INTRAVENOUS | Status: AC
  Administered 2017-10-09: 1000 mg via INTRAVENOUS
  Filled 2017-10-09: qty 20

## 2017-10-09 MED ORDER — SODIUM CHLORIDE 0.9 % IV SOLN
Freq: Once | INTRAVENOUS | Status: AC
  Administered 2017-10-09: 10:00:00 via INTRAVENOUS
  Filled 2017-10-09: qty 1000

## 2017-10-10 LAB — THYROID PANEL WITH TSH
FREE THYROXINE INDEX: 1.8 (ref 1.2–4.9)
T3 Uptake Ratio: 23 % — ABNORMAL LOW (ref 24–39)
T4, Total: 8 ug/dL (ref 4.5–12.0)
TSH: 0.924 u[IU]/mL (ref 0.450–4.500)

## 2017-10-20 NOTE — Progress Notes (Signed)
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  INTERVAL HISTORY: Patient returns to clinic today for further evaluation and consideration of cycle 4 of maintenance Durvalumab.  He continues to notice increased fatigue with dyspnea on exertion, but remains active and continues to work full-time.  He otherwise feels well and is asymptomatic.  He has no neurologic complaints. He has a good appetite and denies weight loss.  He denies any chest pain, shortness of breath, or hemoptysis.  He has an occasional cough.  He has no nausea, vomiting, constipation, or diarrhea.  He has no urinary complaints.  Patient offers no further specific complaints today.  REVIEW OF SYSTEMS:   Review of Systems  Constitutional: Positive for malaise/fatigue. Negative for fever and weight loss.  HENT: Negative for congestion.   Respiratory: Positive for cough and shortness of breath. Negative for hemoptysis.   Cardiovascular: Negative.  Negative for chest pain and leg swelling.  Gastrointestinal: Negative.  Negative for abdominal pain, diarrhea, heartburn, nausea and vomiting.  Genitourinary: Negative.   Musculoskeletal: Negative.  Negative for joint pain.  Skin: Negative.  Negative for rash.  Neurological: Negative.  Negative for sensory change, focal weakness and weakness.  Psychiatric/Behavioral: Negative.  The patient is not nervous/anxious.     As per HPI. Otherwise, a complete review of systems is negative.  PAST MEDICAL HISTORY: Past Medical History:  Diagnosis Date  . COPD (chronic obstructive pulmonary disease) ([place])   . Diabetes mellitus without complication ([place])   . Hypertension   . Pneumonia     PAST  SURGICAL HISTORY: Past Surgical History:  Procedure Laterality Date  . ENDOBRONCHIAL ULTRASOUND N/A 05/12/2017   Procedure: ENDOBRONCHIAL ULTRASOUND;  Surgeon: Laverle Hobby, MD;  Location: [facility] ORS;  Service: Pulmonary;  Laterality: N/A;  . FRACTURE SURGERY Left    ANKLE X 2  . TONSILLECTOMY      FAMILY HISTORY: Family History  Problem Relation Age of Onset  . Stroke Mother   . Diabetes Mother   . Hypertension Mother   . Diabetes Father   . Hypertension Father   . Diabetes Sister   . Diabetes Brother   . Heart attack Paternal Uncle   . Stroke Maternal Grandmother     ADVANCED DIRECTIVES (Y/N):  N  HEALTH MAINTENANCE: Social History   Tobacco Use  . Smoking status: Former Smoker    Packs/day: 1.50    Last attempt to quit: 07/31/2017    Years since quitting: 0.2  . Smokeless tobacco: Never Used  Substance Use Topics  . Alcohol use: No  . Drug use: No     Colonoscopy:  PAP:  Bone density:  Lipid panel:  Allergies  Allergen Reactions  . Shrimp [Shellfish Allergy] Nausea And Vomiting    Current Outpatient Medications  Medication Sig Dispense Refill  . albuterol (PROVENTIL HFA;VENTOLIN HFA) 108 (90 BASE) MCG/ACT inhaler Inhale 2 puffs into the lungs every 6 (six) hours as needed for wheezing or shortness of breath.    Marland Kitchen albuterol (PROVENTIL) (2.5 MG/3ML) 0.083% nebulizer solution Take 2.5 mg every 6 (six) hours as needed by nebulization for wheezing or shortness of breath.    . beclomethasone (QVAR) 80 MCG/ACT inhaler Inhale 1 puff into the lungs 2 (two) times daily.    Marland Kitchen  glipiZIDE (GLUCOTROL) 5 MG tablet Take 5 mg by mouth daily before breakfast.    . ibuprofen (ADVIL,MOTRIN) 200 MG tablet Take 400-800 mg every 8 (eight) hours as needed by mouth (for pain.).    Marland Kitchen lisinopril-hydrochlorothiazide (PRINZIDE,ZESTORETIC) 20-25 MG tablet Take 1 tablet by mouth daily.    . metFORMIN (GLUCOPHAGE) 1000 MG tablet Take 1,000 mg by mouth 2 (two) times daily.    Marland Kitchen  omeprazole (PRILOSEC) 20 MG capsule Take 1 capsule (20 mg total) by mouth daily. 30 capsule 2  . sucralfate (CARAFATE) 1 g tablet Take 1 tablet (1 g total) by mouth 3 (three) times daily. Dissolve in 2-3 tbsp warm water, swish and swallow. 90 tablet 3  . traMADol (ULTRAM) 50 MG tablet Take 1 tablet (50 mg total) by mouth every 6 (six) hours as needed. 30 tablet 0  . umeclidinium-vilanterol (ANORO ELLIPTA) 62.5-25 MCG/INH AEPB Inhale 1 puff into the lungs daily. Take in addition to Qvar. 1 each 5   No current facility-administered medications for this visit.     OBJECTIVE: Vitals:   10/23/17 1042  BP: (!) 157/98  Pulse: (!) 101  Resp: 18  Temp: (!) 97.3 F (36.3 C)     Body mass index is 34.53 kg/m.    ECOG FS:0 - Asymptomatic  General: Well-developed, well-nourished, no acute distress. Eyes: Pink conjunctiva, anicteric sclera. Lungs: Clear to auscultation bilaterally. Heart: Regular rate and rhythm. No rubs, murmurs, or gallops. Abdomen: Soft, nontender, nondistended. No organomegaly noted, normoactive bowel sounds. Musculoskeletal: No edema, cyanosis, or clubbing. Neuro: Alert, answering all questions appropriately. Cranial nerves grossly intact. Skin: No rashes or petechiae noted. Psych: Normal affect.  LAB RESULTS:  Lab Results  Component Value Date   NA 135 10/23/2017   K 4.0 10/23/2017   CL 99 (L) 10/23/2017   CO2 25 10/23/2017   GLUCOSE 221 (H) 10/23/2017   BUN 12 10/23/2017   CREATININE 0.82 10/23/2017   CALCIUM 9.1 10/23/2017   PROT 6.9 10/23/2017   ALBUMIN 3.8 10/23/2017   AST 24 10/23/2017   ALT 21 10/23/2017   ALKPHOS 108 10/23/2017   BILITOT 0.4 10/23/2017   GFRNONAA >60 10/23/2017   GFRAA >60 10/23/2017    Lab Results  Component Value Date   WBC 8.7 10/23/2017   NEUTROABS 6.7 (H) 10/23/2017   HGB 15.4 10/23/2017   HCT 43.8 10/23/2017   MCV 92.9 10/23/2017   PLT 250 10/23/2017     STUDIES: No results found.  ASSESSMENT: Stage IIIb squamous  cell carcinoma of the left lung.  PLAN:    1. 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  Patient was also discussed at cancer conference.  MRI of the brain is negative. PET scan results from September 08, 2017 reviewed independently with excellent response to treatment.  Proceed with cycle 4 of maintenance Durvalumab today.  We will continue treatment for a total of 12 months.  Patient has declined port placement at this time.  Return to clinic in 2 weeks for consideration of cycle 5 and then in 4 weeks for further evaluation and consideration of cycle 6 of Durvalumab. 2.  Hyperglycemia: Patient's blood sugars remain persistently elevated.  Patient is no longer receiving dexamethasone as a premedication.  3.  Indigestion: Continue omeprazole as prescribed. 4.  Pain: Chronic and unchanged.  Patient does not complain of this today.  Continue tramadol as needed. 5.  Cough/congestion: Continue OTC treatments as needed. 6.  Hypertension: Patient's  blood pressure remains persistently elevated.  Continue current treatment as prescribed. 7.  Fatigue: Likely secondary to treatment.  Patient's thyroid panel is essentially within normal limits.  Monitor.  Approximately 30 minutes spent in discussion of which greater than 50% was consultation.  Patient expressed understanding and was in agreement with this plan. He also understands that He can call clinic at any time with any questions, concerns, or complaints.   Cancer Staging Squamous cell lung cancer, left [facility]) Staging form: Lung, AJCC 8th Edition - Clinical stage from 05/16/2017: Stage IIIB (cT3, cN2, cM0) - Signed by Lloyd Huger, MD on 05/16/2017   Lloyd Huger, MD   10/26/2017 4:31 PM

## 2017-10-23 ENCOUNTER — Inpatient Hospital Stay: Payer: BLUE CROSS/BLUE SHIELD

## 2017-10-23 ENCOUNTER — Inpatient Hospital Stay (HOSPITAL_BASED_OUTPATIENT_CLINIC_OR_DEPARTMENT_OTHER): Payer: BLUE CROSS/BLUE SHIELD | Admitting: Oncology

## 2017-10-23 ENCOUNTER — Inpatient Hospital Stay: Payer: BLUE CROSS/BLUE SHIELD | Attending: Oncology

## 2017-10-23 ENCOUNTER — Encounter: Payer: Self-pay | Admitting: Oncology

## 2017-10-23 VITALS — BP 157/98 | HR 101 | Temp 97.3°F | Resp 18 | Wt 227.1 lb

## 2017-10-23 DIAGNOSIS — I10 Essential (primary) hypertension: Secondary | ICD-10-CM

## 2017-10-23 DIAGNOSIS — R5383 Other fatigue: Secondary | ICD-10-CM | POA: Insufficient documentation

## 2017-10-23 DIAGNOSIS — R739 Hyperglycemia, unspecified: Secondary | ICD-10-CM

## 2017-10-23 DIAGNOSIS — Z5112 Encounter for antineoplastic immunotherapy: Secondary | ICD-10-CM | POA: Diagnosis not present

## 2017-10-23 DIAGNOSIS — C3492 Malignant neoplasm of unspecified part of left bronchus or lung: Secondary | ICD-10-CM

## 2017-10-23 DIAGNOSIS — Z87891 Personal history of nicotine dependence: Secondary | ICD-10-CM | POA: Insufficient documentation

## 2017-10-23 DIAGNOSIS — K30 Functional dyspepsia: Secondary | ICD-10-CM

## 2017-10-23 LAB — CBC WITH DIFFERENTIAL/PLATELET
BASOS PCT: 1 %
Basophils Absolute: 0.1 10*3/uL (ref 0–0.1)
EOS ABS: 0.3 10*3/uL (ref 0–0.7)
Eosinophils Relative: 4 %
HEMATOCRIT: 43.8 % (ref 40.0–52.0)
HEMOGLOBIN: 15.4 g/dL (ref 13.0–18.0)
LYMPHS ABS: 0.9 10*3/uL — AB (ref 1.0–3.6)
Lymphocytes Relative: 10 %
MCH: 32.6 pg (ref 26.0–34.0)
MCHC: 35.1 g/dL (ref 32.0–36.0)
MCV: 92.9 fL (ref 80.0–100.0)
Monocytes Absolute: 0.8 10*3/uL (ref 0.2–1.0)
Monocytes Relative: 9 %
NEUTROS ABS: 6.7 10*3/uL — AB (ref 1.4–6.5)
NEUTROS PCT: 76 %
Platelets: 250 10*3/uL (ref 150–440)
RBC: 4.71 MIL/uL (ref 4.40–5.90)
RDW: 12.4 % (ref 11.5–14.5)
WBC: 8.7 10*3/uL (ref 3.8–10.6)

## 2017-10-23 LAB — COMPREHENSIVE METABOLIC PANEL
ALBUMIN: 3.8 g/dL (ref 3.5–5.0)
ALK PHOS: 108 U/L (ref 38–126)
ALT: 21 U/L (ref 17–63)
AST: 24 U/L (ref 15–41)
Anion gap: 11 (ref 5–15)
BUN: 12 mg/dL (ref 6–20)
CALCIUM: 9.1 mg/dL (ref 8.9–10.3)
CO2: 25 mmol/L (ref 22–32)
CREATININE: 0.82 mg/dL (ref 0.61–1.24)
Chloride: 99 mmol/L — ABNORMAL LOW (ref 101–111)
GFR calc Af Amer: >60 mL/min (ref >60)
GFR calc non Af Amer: >60 mL/min (ref >60)
GLUCOSE: 221 mg/dL — AB (ref 65–99)
Potassium: 4 mmol/L (ref 3.5–5.1)
SODIUM: 135 mmol/L (ref 135–145)
Total Bilirubin: 0.4 mg/dL (ref 0.3–1.2)
Total Protein: 6.9 g/dL (ref 6.5–8.1)

## 2017-10-23 MED ORDER — SODIUM CHLORIDE 0.9 % IV SOLN
1000.0000 mg | Freq: Once | INTRAVENOUS | Status: AC
  Administered 2017-10-23: 1000 mg via INTRAVENOUS
  Filled 2017-10-23: qty 20

## 2017-10-23 MED ORDER — OMEPRAZOLE 20 MG PO CPDR: 20.0000 mg | DELAYED_RELEASE_CAPSULE | Freq: Every day | ORAL | 2 refills | Status: DC

## 2017-10-23 MED ORDER — SODIUM CHLORIDE 0.9 % IV SOLN
Freq: Once | INTRAVENOUS | Status: AC
  Administered 2017-10-23: 10:00:00 via INTRAVENOUS
  Filled 2017-10-23: qty 1000

## 2017-10-23 NOTE — Progress Notes (Signed)
Patient reports increased fatigue and shortness of breath today.

## 2017-10-24 LAB — THYROID PANEL WITH TSH
FREE THYROXINE INDEX: 2 (ref 1.2–4.9)
T3 Uptake Ratio: 22 % — ABNORMAL LOW (ref 24–39)
T4, Total: 9.1 ug/dL (ref 4.5–12.0)
TSH: 1.14 u[IU]/mL (ref 0.450–4.500)

## 2017-11-06 ENCOUNTER — Inpatient Hospital Stay: Payer: BLUE CROSS/BLUE SHIELD

## 2017-11-06 VITALS — BP 108/74 | HR 83 | Temp 96.2°F | Resp 20 | Wt 222.4 lb

## 2017-11-06 DIAGNOSIS — Z5112 Encounter for antineoplastic immunotherapy: Secondary | ICD-10-CM | POA: Diagnosis not present

## 2017-11-06 DIAGNOSIS — C3492 Malignant neoplasm of unspecified part of left bronchus or lung: Secondary | ICD-10-CM

## 2017-11-06 LAB — CBC WITH DIFFERENTIAL/PLATELET
BASOS ABS: 0.1 10*3/uL (ref 0–0.1)
Basophils Relative: 1 %
EOS ABS: 0.3 10*3/uL (ref 0–0.7)
Eosinophils Relative: 4 %
HCT: 43 % (ref 40.0–52.0)
HEMOGLOBIN: 15.2 g/dL (ref 13.0–18.0)
LYMPHS ABS: 0.7 10*3/uL — AB (ref 1.0–3.6)
Lymphocytes Relative: 8 %
MCH: 32.2 pg (ref 26.0–34.0)
MCHC: 35.4 g/dL (ref 32.0–36.0)
MCV: 91 fL (ref 80.0–100.0)
Monocytes Absolute: 0.8 10*3/uL (ref 0.2–1.0)
Monocytes Relative: 9 %
NEUTROS PCT: 78 %
Neutro Abs: 6.7 10*3/uL — ABNORMAL HIGH (ref 1.4–6.5)
Platelets: 291 10*3/uL (ref 150–440)
RBC: 4.73 MIL/uL (ref 4.40–5.90)
RDW: 12.4 % (ref 11.5–14.5)
WBC: 8.6 10*3/uL (ref 3.8–10.6)

## 2017-11-06 LAB — COMPREHENSIVE METABOLIC PANEL
ALBUMIN: 4 g/dL (ref 3.5–5.0)
ALK PHOS: 97 U/L (ref 38–126)
ALT: 23 U/L (ref 17–63)
ANION GAP: 11 (ref 5–15)
AST: 24 U/L (ref 15–41)
BUN: 15 mg/dL (ref 6–20)
CHLORIDE: 98 mmol/L — AB (ref 101–111)
CO2: 25 mmol/L (ref 22–32)
CREATININE: 0.83 mg/dL (ref 0.61–1.24)
Calcium: 9.4 mg/dL (ref 8.9–10.3)
GFR calc Af Amer: >60 mL/min (ref >60)
GFR calc non Af Amer: >60 mL/min (ref >60)
Glucose, Bld: 187 mg/dL — ABNORMAL HIGH (ref 65–99)
Potassium: 3.9 mmol/L (ref 3.5–5.1)
Sodium: 134 mmol/L — ABNORMAL LOW (ref 135–145)
Total Bilirubin: 0.7 mg/dL (ref 0.3–1.2)
Total Protein: 7.3 g/dL (ref 6.5–8.1)

## 2017-11-06 MED ORDER — DURVALUMAB 500 MG/10ML IV SOLN
1000.0000 mg | Freq: Once | INTRAVENOUS | Status: AC
  Administered 2017-11-06: 1000 mg via INTRAVENOUS
  Filled 2017-11-06: qty 20

## 2017-11-06 MED ORDER — SODIUM CHLORIDE 0.9 % IV SOLN
Freq: Once | INTRAVENOUS | Status: AC
  Administered 2017-11-06: 09:00:00 via INTRAVENOUS
  Filled 2017-11-06: qty 1000

## 2017-11-07 LAB — THYROID PANEL WITH TSH
FREE THYROXINE INDEX: 2.2 (ref 1.2–4.9)
T3 Uptake Ratio: 23 % — ABNORMAL LOW (ref 24–39)
T4, Total: 9.7 ug/dL (ref 4.5–12.0)
TSH: 1.11 u[IU]/mL (ref 0.450–4.500)

## 2017-11-20 ENCOUNTER — Inpatient Hospital Stay (HOSPITAL_BASED_OUTPATIENT_CLINIC_OR_DEPARTMENT_OTHER): Payer: BLUE CROSS/BLUE SHIELD | Admitting: Oncology

## 2017-11-20 ENCOUNTER — Other Ambulatory Visit: Payer: Self-pay

## 2017-11-20 ENCOUNTER — Inpatient Hospital Stay: Payer: BLUE CROSS/BLUE SHIELD

## 2017-11-20 ENCOUNTER — Inpatient Hospital Stay: Payer: BLUE CROSS/BLUE SHIELD | Attending: Oncology

## 2017-11-20 ENCOUNTER — Encounter: Payer: Self-pay | Admitting: Oncology

## 2017-11-20 VITALS — BP 148/90 | HR 91 | Temp 97.5°F | Resp 20 | Wt 225.8 lb

## 2017-11-20 DIAGNOSIS — I10 Essential (primary) hypertension: Secondary | ICD-10-CM | POA: Insufficient documentation

## 2017-11-20 DIAGNOSIS — Z79899 Other long term (current) drug therapy: Secondary | ICD-10-CM

## 2017-11-20 DIAGNOSIS — E1165 Type 2 diabetes mellitus with hyperglycemia: Secondary | ICD-10-CM | POA: Insufficient documentation

## 2017-11-20 DIAGNOSIS — C3492 Malignant neoplasm of unspecified part of left bronchus or lung: Secondary | ICD-10-CM | POA: Diagnosis not present

## 2017-11-20 DIAGNOSIS — Z87891 Personal history of nicotine dependence: Secondary | ICD-10-CM | POA: Diagnosis not present

## 2017-11-20 DIAGNOSIS — Z5111 Encounter for antineoplastic chemotherapy: Secondary | ICD-10-CM | POA: Insufficient documentation

## 2017-11-20 DIAGNOSIS — J449 Chronic obstructive pulmonary disease, unspecified: Secondary | ICD-10-CM

## 2017-11-20 DIAGNOSIS — Z7984 Long term (current) use of oral hypoglycemic drugs: Secondary | ICD-10-CM

## 2017-11-20 LAB — COMPREHENSIVE METABOLIC PANEL
ALBUMIN: 3.8 g/dL (ref 3.5–5.0)
ALT: 18 U/L (ref 17–63)
ANION GAP: 11 (ref 5–15)
AST: 20 U/L (ref 15–41)
Alkaline Phosphatase: 94 U/L (ref 38–126)
BILIRUBIN TOTAL: 0.6 mg/dL (ref 0.3–1.2)
BUN: 10 mg/dL (ref 6–20)
CHLORIDE: 99 mmol/L — AB (ref 101–111)
CO2: 25 mmol/L (ref 22–32)
Calcium: 9.2 mg/dL (ref 8.9–10.3)
Creatinine, Ser: 0.72 mg/dL (ref 0.61–1.24)
GFR calc Af Amer: >60 mL/min (ref >60)
GFR calc non Af Amer: >60 mL/min (ref >60)
Glucose, Bld: 189 mg/dL — ABNORMAL HIGH (ref 65–99)
POTASSIUM: 3.8 mmol/L (ref 3.5–5.1)
Sodium: 135 mmol/L (ref 135–145)
TOTAL PROTEIN: 6.8 g/dL (ref 6.5–8.1)

## 2017-11-20 LAB — CBC WITH DIFFERENTIAL/PLATELET
BASOS ABS: 0.1 10*3/uL (ref 0–0.1)
BASOS PCT: 1 %
Eosinophils Absolute: 0.3 10*3/uL (ref 0–0.7)
Eosinophils Relative: 3 %
HCT: 42 % (ref 40.0–52.0)
HEMOGLOBIN: 14.7 g/dL (ref 13.0–18.0)
Lymphocytes Relative: 9 %
Lymphs Abs: 0.9 10*3/uL — ABNORMAL LOW (ref 1.0–3.6)
MCH: 31.5 pg (ref 26.0–34.0)
MCHC: 35.1 g/dL (ref 32.0–36.0)
MCV: 89.7 fL (ref 80.0–100.0)
MONOS PCT: 9 %
Monocytes Absolute: 0.9 10*3/uL (ref 0.2–1.0)
NEUTROS PCT: 78 %
Neutro Abs: 7.6 10*3/uL — ABNORMAL HIGH (ref 1.4–6.5)
Platelets: 297 10*3/uL (ref 150–440)
RBC: 4.68 MIL/uL (ref 4.40–5.90)
RDW: 12.3 % (ref 11.5–14.5)
WBC: 9.8 10*3/uL (ref 3.8–10.6)

## 2017-11-20 MED ORDER — SODIUM CHLORIDE 0.9 % IV SOLN
1000.0000 mg | Freq: Once | INTRAVENOUS | Status: AC
  Administered 2017-11-20: 1000 mg via INTRAVENOUS
  Filled 2017-11-20: qty 20

## 2017-11-20 MED ORDER — SODIUM CHLORIDE 0.9 % IV SOLN
Freq: Once | INTRAVENOUS | Status: AC
Start: 2017-11-20 — End: 2017-11-20
  Administered 2017-11-20: 10:00:00 via INTRAVENOUS
  Filled 2017-11-20: qty 1000

## 2017-11-20 NOTE — Progress Notes (Signed)
Patient denies any concerns today.  

## 2017-11-20 NOTE — Progress Notes (Signed)
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  INTERVAL HISTORY:  Patient returns to clinic for further evaluation and consideration of cycle 6 of maintenance durvalumab.  Continues to notice increased fatigue with dyspnea on exertion but otherwise remains active and continues to work full-time.  He feels well and is asymptomatic.  He denies any neurological complaints.  Maintains a great appetite and weight loss.  He denies any chest pain, shortness of breath, hemoptysis, nausea, vomiting, constipation or diarrhea.  He has no urinary complaints.  Admits to occasional chronic cough with yellow sputum production.   REVIEW OF SYSTEMS:   Review of Systems  Constitutional: Positive for malaise/fatigue. Negative for chills, fever and weight loss.  HENT: Negative for congestion and ear pain.   Eyes: Negative.  Negative for blurred vision and double vision.  Respiratory: Positive for cough, sputum production and shortness of breath.   Cardiovascular: Negative.  Negative for chest pain, palpitations and leg swelling.  Gastrointestinal: Negative.  Negative for abdominal pain, constipation, diarrhea, nausea and vomiting.  Genitourinary: Negative for dysuria, frequency and urgency.  Musculoskeletal: Negative for back pain and falls.  Skin: Negative.  Negative for rash.  Neurological: Negative.  Negative for weakness and headaches.  Endo/Heme/Allergies: Negative.  Does not bruise/bleed easily.  Psychiatric/Behavioral: Negative.  Negative for depression. The patient is not nervous/anxious and does not have insomnia.     As per HPI. Otherwise, a complete review of systems is negative.  PAST MEDICAL  HISTORY: Past Medical History:  Diagnosis Date  . COPD (chronic obstructive pulmonary disease) ([place])   . Diabetes mellitus without complication ([place])   . Hypertension   . Pneumonia     PAST SURGICAL HISTORY: Past Surgical History:  Procedure Laterality Date  . ENDOBRONCHIAL ULTRASOUND N/A 05/12/2017   Procedure: ENDOBRONCHIAL ULTRASOUND;  Surgeon: Laverle Hobby, MD;  Location: [facility] ORS;  Service: Pulmonary;  Laterality: N/A;  . FRACTURE SURGERY Left    ANKLE X 2  . TONSILLECTOMY      FAMILY HISTORY: Family History  Problem Relation Age of Onset  . Stroke Mother   . Diabetes Mother   . Hypertension Mother   . Diabetes Father   . Hypertension Father   . Diabetes Sister   . Diabetes Brother   . Heart attack Paternal Uncle   . Stroke Maternal Grandmother     ADVANCED DIRECTIVES (Y/N):  N  HEALTH MAINTENANCE: Social History   Tobacco Use  . Smoking status: Former Smoker    Packs/day: 1.50    Last attempt to quit: 07/31/2017    Years since quitting: 0.3  . Smokeless tobacco: Never Used  Substance Use Topics  . Alcohol use: No  . Drug use: No     Colonoscopy:  PAP:  Bone density:  Lipid panel:  Allergies  Allergen Reactions  . Shrimp [Shellfish Allergy] Nausea And Vomiting    Current Outpatient Medications  Medication Sig Dispense Refill  . albuterol (PROVENTIL HFA;VENTOLIN HFA) 108 (90 BASE) MCG/ACT inhaler Inhale 2 puffs into the lungs every 6 (six) hours as needed for wheezing or shortness of breath.    Marland Kitchen albuterol (PROVENTIL) (2.5 MG/3ML) 0.083% nebulizer solution Take 2.5 mg every 6 (six) hours as needed by nebulization for wheezing or  shortness of breath.    . beclomethasone (QVAR) 80 MCG/ACT inhaler Inhale 1 puff into the lungs 2 (two) times daily.    Marland Kitchen glipiZIDE (GLUCOTROL) 5 MG tablet Take 5 mg by mouth daily before breakfast.    . ibuprofen (ADVIL,MOTRIN) 200 MG tablet Take 400-800 mg every 8 (eight) hours as needed by mouth (for pain.).     Marland Kitchen lisinopril-hydrochlorothiazide (PRINZIDE,ZESTORETIC) 20-25 MG tablet Take 1 tablet by mouth daily.    . metFORMIN (GLUCOPHAGE) 1000 MG tablet Take 1,000 mg by mouth 2 (two) times daily.    Marland Kitchen omeprazole (PRILOSEC) 20 MG capsule Take 1 capsule (20 mg total) by mouth daily. 30 capsule 2  . sucralfate (CARAFATE) 1 g tablet Take 1 tablet (1 g total) by mouth 3 (three) times daily. Dissolve in 2-3 tbsp warm water, swish and swallow. 90 tablet 3  . traMADol (ULTRAM) 50 MG tablet Take 1 tablet (50 mg total) by mouth every 6 (six) hours as needed. 30 tablet 0  . umeclidinium-vilanterol (ANORO ELLIPTA) 62.5-25 MCG/INH AEPB Inhale 1 puff into the lungs daily. Take in addition to Qvar. 1 each 5   No current facility-administered medications for this visit.     OBJECTIVE: There were no vitals filed for this visit.   There is no height or weight on file to calculate BMI.    ECOG FS:0 - Asymptomatic Physical Exam  Constitutional: He is oriented to person, place, and time. Vital signs are normal. He appears well-developed and well-nourished.  HENT:  Head: Normocephalic and atraumatic.  Eyes: Pupils are equal, round, and reactive to light.  Neck: Normal range of motion.  Cardiovascular: Normal rate, regular rhythm and normal heart sounds.  No murmur heard. Pulmonary/Chest: Effort normal and breath sounds normal. He has no wheezes.  Abdominal: Soft. Normal appearance and bowel sounds are normal. He exhibits no distension. There is no tenderness.  Musculoskeletal: Normal range of motion. He exhibits no edema.  Neurological: He is alert and oriented to person, place, and time.  Skin: Skin is warm and dry. No rash noted.  Psychiatric: Judgment normal.     LAB RESULTS:  Lab Results  Component Value Date   NA 135 11/20/2017   K 3.8 11/20/2017   CL 99 (L) 11/20/2017   CO2 25 11/20/2017   GLUCOSE 189 (H) 11/20/2017   BUN 10 11/20/2017   CREATININE 0.72 11/20/2017   CALCIUM 9.2 11/20/2017   PROT  6.8 11/20/2017   ALBUMIN 3.8 11/20/2017   AST 20 11/20/2017   ALT 18 11/20/2017   ALKPHOS 94 11/20/2017   BILITOT 0.6 11/20/2017   GFRNONAA >60 11/20/2017   GFRAA >60 11/20/2017    Lab Results  Component Value Date   WBC 9.8 11/20/2017   NEUTROABS 7.6 (H) 11/20/2017   HGB 14.7 11/20/2017   HCT 42.0 11/20/2017   MCV 89.7 11/20/2017   PLT 297 11/20/2017     STUDIES: No results found.  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  PLAN:   1. 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  MRI of the brain was negative.  Proceed with cycle 6 of maintenance Duvalumab.  Plan is to continue for a total of 12 months.  He continues to decline port placement at this time.  He will return to clinic in 2 weeks for consideration of cycle 7.  2.  Hyperglycemia: Blood sugars appear to  be better controlled < 200.  Continue medications as prescribed by PCP.  He is no longer receiving dexamethasone as a premedication. 3.  Pain: Does not complain of this today. 4.  Hypertension: Continue to monitor.  Slightly improved since last visit. 5.  Fatigue: D/t  treatment.  Monitor thyroid panel.  Stable today.  Greater than 50% was spent in counseling and coordination of care with this patient including but not limited to discussion of the relevant topics above (See A&P) including, but not limited to diagnosis and management of acute and chronic medical conditions.   Patient expressed understanding and was in agreement with this plan. He also understands that He can call clinic at any time with any questions, concerns, or complaints.   Cancer Staging Squamous cell lung cancer, left [facility]) Staging form: Lung, AJCC 8th Edition - Clinical stage from 05/16/2017: Stage IIIB (cT3, cN2, cM0) - Signed by Lloyd Huger, MD on 05/16/2017   Jacquelin Hawking, NP   11/20/2017 9:45 AM

## 2017-11-21 LAB — THYROID PANEL WITH TSH
Free Thyroxine Index: 2.1 (ref 1.2–4.9)
T3 UPTAKE RATIO: 23 % — AB (ref 24–39)
T4, Total: 9.1 ug/dL (ref 4.5–12.0)
TSH: 1.04 u[IU]/mL (ref 0.450–4.500)

## 2017-12-01 NOTE — Progress Notes (Signed)
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  INTERVAL HISTORY: Patient returns to clinic today for further evaluation and consideration of cycle 6 of maintenance Durvalumab.  He currently feels well and is asymptomatic.  He continues to remain active and work full-time. He has no neurologic complaints. He has a good appetite and denies weight loss.  He denies any chest pain, shortness of breath, or hemoptysis.  He has an occasional cough.  He has no nausea, vomiting, constipation, or diarrhea.  He has no urinary complaints.  Patient offers no specific complaints today.  REVIEW OF SYSTEMS:   Review of Systems  Constitutional: Negative.  Negative for fever and weight loss.  HENT: Negative for congestion.   Respiratory: Positive for cough. Negative for hemoptysis and shortness of breath.   Cardiovascular: Negative.  Negative for chest pain and leg swelling.  Gastrointestinal: Negative.  Negative for abdominal pain and heartburn.  Genitourinary: Negative.  Negative for dysuria.  Musculoskeletal: Negative.  Negative for joint pain.  Skin: Negative.  Negative for rash.  Neurological: Negative.  Negative for sensory change, focal weakness and weakness.  Psychiatric/Behavioral: Negative.  The patient is not nervous/anxious.     As per HPI. Otherwise, a complete review of systems is negative.  PAST MEDICAL HISTORY: Past Medical History:  Diagnosis Date  . COPD (chronic obstructive pulmonary disease) ([place])   . Diabetes mellitus without complication ([place])   . Hypertension   . Pneumonia     PAST SURGICAL HISTORY: Past Surgical History:  Procedure Laterality Date  . ENDOBRONCHIAL ULTRASOUND N/A  05/12/2017   Procedure: ENDOBRONCHIAL ULTRASOUND;  Surgeon: Laverle Hobby, MD;  Location: [facility] ORS;  Service: Pulmonary;  Laterality: N/A;  . FRACTURE SURGERY Left    ANKLE X 2  . TONSILLECTOMY      FAMILY HISTORY: Family History  Problem Relation Age of Onset  . Stroke Mother   . Diabetes Mother   . Hypertension Mother   . Diabetes Father   . Hypertension Father   . Diabetes Sister   . Diabetes Brother   . Heart attack Paternal Uncle   . Stroke Maternal Grandmother     ADVANCED DIRECTIVES (Y/N):  N  HEALTH MAINTENANCE: Social History   Tobacco Use  . Smoking status: Former Smoker    Packs/day: 1.50    Last attempt to quit: 07/31/2017    Years since quitting: 0.3  . Smokeless tobacco: Never Used  Substance Use Topics  . Alcohol use: No  . Drug use: No     Colonoscopy:  PAP:  Bone density:  Lipid panel:  Allergies  Allergen Reactions  . Shrimp [Shellfish Allergy] Nausea And Vomiting    Current Outpatient Medications  Medication Sig Dispense Refill  . albuterol (PROVENTIL HFA;VENTOLIN HFA) 108 (90 BASE) MCG/ACT inhaler Inhale 2 puffs into the lungs every 6 (six) hours as needed for wheezing or shortness of breath.    Marland Kitchen albuterol (PROVENTIL) (2.5 MG/3ML) 0.083% nebulizer solution Take 2.5 mg every 6 (six) hours as needed by nebulization for wheezing or shortness of breath.    . beclomethasone (QVAR) 80 MCG/ACT inhaler Inhale 1 puff into the lungs 2 (two) times daily.    Marland Kitchen glipiZIDE (GLUCOTROL) 5 MG tablet Take 5 mg by  mouth daily before breakfast.    . ibuprofen (ADVIL,MOTRIN) 200 MG tablet Take 400-800 mg every 8 (eight) hours as needed by mouth (for pain.).    Marland Kitchen lisinopril-hydrochlorothiazide (PRINZIDE,ZESTORETIC) 20-25 MG tablet Take 1 tablet by mouth daily.    . metFORMIN (GLUCOPHAGE) 1000 MG tablet Take 1,000 mg by mouth 2 (two) times daily.    Marland Kitchen omeprazole (PRILOSEC) 20 MG capsule Take 1 capsule (20 mg total) by mouth daily. 30 capsule 2  .  sucralfate (CARAFATE) 1 g tablet Take 1 tablet (1 g total) by mouth 3 (three) times daily. Dissolve in 2-3 tbsp warm water, swish and swallow. 90 tablet 3  . traMADol (ULTRAM) 50 MG tablet Take 1 tablet (50 mg total) by mouth every 6 (six) hours as needed. 30 tablet 0  . umeclidinium-vilanterol (ANORO ELLIPTA) 62.5-25 MCG/INH AEPB Inhale 1 puff into the lungs daily. Take in addition to Qvar. 1 each 5   No current facility-administered medications for this visit.     OBJECTIVE: Vitals:   12/04/17 0958  BP: (!) 177/98  Pulse: 96  Resp: 20  Temp: 97.7 F (36.5 C)  SpO2: 95%     Body mass index is 34.67 kg/m.    ECOG FS:0 - Asymptomatic  General: Well-developed, well-nourished, no acute distress. Eyes: Pink conjunctiva, anicteric sclera. Lungs: Clear to auscultation bilaterally. Heart: Regular rate and rhythm. No rubs, murmurs, or gallops. Abdomen: Soft, nontender, nondistended. No organomegaly noted, normoactive bowel sounds. Musculoskeletal: No edema, cyanosis, or clubbing. Neuro: Alert, answering all questions appropriately. Cranial nerves grossly intact. Skin: No rashes or petechiae noted. Psych: Normal affect.  LAB RESULTS:  Lab Results  Component Value Date   NA 137 12/04/2017   K 3.7 12/04/2017   CL 99 (L) 12/04/2017   CO2 26 12/04/2017   GLUCOSE 197 (H) 12/04/2017   BUN 12 12/04/2017   CREATININE 0.78 12/04/2017   CALCIUM 9.2 12/04/2017   PROT 6.8 12/04/2017   ALBUMIN 3.9 12/04/2017   AST 18 12/04/2017   ALT 17 12/04/2017   ALKPHOS 90 12/04/2017   BILITOT 0.6 12/04/2017   GFRNONAA >60 12/04/2017   GFRAA >60 12/04/2017    Lab Results  Component Value Date   WBC 9.0 12/04/2017   NEUTROABS 7.0 (H) 12/04/2017   HGB 14.3 12/04/2017   HCT 41.0 12/04/2017   MCV 89.7 12/04/2017   PLT 261 12/04/2017     STUDIES: No results found.  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  PLAN:    1. Stage IIIb squamous cell carcinoma of the left  lung: Imaging and pathology results reviewed independently.  Patient was also discussed at cancer conference.  MRI of the brain is negative. PET scan results from September 08, 2017 reviewed independently with excellent response to treatment.  Proceed with cycle 6 of maintenance durvalumab today.  Will continue treatment for a total of 12 months.  Patient continues to decline port placement.  Return to clinic in 2 weeks for consideration of cycle 7 and then in 4 weeks for further evaluation and consideration of cycle 8.  Will repeat imaging in approximately July 2019.  2.  Hyperglycemia: Chronic and unchanged.  Patient is no longer receiving dexamethasone as a premedication.  3.  Indigestion: Continue omeprazole as prescribed. 4.  Pain: Patient does not complain of this today.  Continue tramadol as needed. 5.  Cough/congestion: Continue OTC treatments as needed. 6.  Hypertension: Patient's blood pressure remains persistently elevated.  Continue current treatment as prescribed.  I spent a total of 30 minutes face-to-face with the patient of which greater than 50% of the visit was spent in counseling and coordination of care as summarized above.  Patient expressed understanding and was in agreement with this plan. He also understands that He can call clinic at any time with any questions, concerns, or complaints.   Cancer Staging Squamous cell lung cancer, left [facility]) Staging form: Lung, AJCC 8th Edition - Clinical stage from 05/16/2017: Stage IIIB (cT3, cN2, cM0) - Signed by Lloyd Huger, MD on 05/16/2017   Lloyd Huger, MD   12/07/2017 11:36 PM

## 2017-12-04 ENCOUNTER — Inpatient Hospital Stay: Payer: BLUE CROSS/BLUE SHIELD

## 2017-12-04 ENCOUNTER — Other Ambulatory Visit: Payer: Self-pay

## 2017-12-04 ENCOUNTER — Inpatient Hospital Stay (HOSPITAL_BASED_OUTPATIENT_CLINIC_OR_DEPARTMENT_OTHER): Payer: BLUE CROSS/BLUE SHIELD | Admitting: Oncology

## 2017-12-04 ENCOUNTER — Encounter: Payer: Self-pay | Admitting: Oncology

## 2017-12-04 VITALS — BP 177/98 | HR 96 | Temp 97.7°F | Resp 20 | Wt 228.0 lb

## 2017-12-04 DIAGNOSIS — J449 Chronic obstructive pulmonary disease, unspecified: Secondary | ICD-10-CM

## 2017-12-04 DIAGNOSIS — C3492 Malignant neoplasm of unspecified part of left bronchus or lung: Secondary | ICD-10-CM

## 2017-12-04 DIAGNOSIS — Z79899 Other long term (current) drug therapy: Secondary | ICD-10-CM

## 2017-12-04 DIAGNOSIS — Z5111 Encounter for antineoplastic chemotherapy: Secondary | ICD-10-CM | POA: Diagnosis not present

## 2017-12-04 DIAGNOSIS — E1165 Type 2 diabetes mellitus with hyperglycemia: Secondary | ICD-10-CM | POA: Diagnosis not present

## 2017-12-04 DIAGNOSIS — Z87891 Personal history of nicotine dependence: Secondary | ICD-10-CM | POA: Diagnosis not present

## 2017-12-04 DIAGNOSIS — I10 Essential (primary) hypertension: Secondary | ICD-10-CM | POA: Diagnosis not present

## 2017-12-04 DIAGNOSIS — Z7984 Long term (current) use of oral hypoglycemic drugs: Secondary | ICD-10-CM | POA: Diagnosis not present

## 2017-12-04 LAB — COMPREHENSIVE METABOLIC PANEL
ALBUMIN: 3.9 g/dL (ref 3.5–5.0)
ALK PHOS: 90 U/L (ref 38–126)
ALT: 17 U/L (ref 17–63)
ANION GAP: 12 (ref 5–15)
AST: 18 U/L (ref 15–41)
BILIRUBIN TOTAL: 0.6 mg/dL (ref 0.3–1.2)
BUN: 12 mg/dL (ref 6–20)
CALCIUM: 9.2 mg/dL (ref 8.9–10.3)
CO2: 26 mmol/L (ref 22–32)
Chloride: 99 mmol/L — ABNORMAL LOW (ref 101–111)
Creatinine, Ser: 0.78 mg/dL (ref 0.61–1.24)
Glucose, Bld: 197 mg/dL — ABNORMAL HIGH (ref 65–99)
POTASSIUM: 3.7 mmol/L (ref 3.5–5.1)
Sodium: 137 mmol/L (ref 135–145)
TOTAL PROTEIN: 6.8 g/dL (ref 6.5–8.1)

## 2017-12-04 LAB — CBC WITH DIFFERENTIAL/PLATELET
BASOS ABS: 0.1 10*3/uL (ref 0–0.1)
BASOS PCT: 1 %
Eosinophils Absolute: 0.3 10*3/uL (ref 0–0.7)
Eosinophils Relative: 3 %
HEMATOCRIT: 41 % (ref 40.0–52.0)
Hemoglobin: 14.3 g/dL (ref 13.0–18.0)
LYMPHS PCT: 9 %
Lymphs Abs: 0.8 10*3/uL — ABNORMAL LOW (ref 1.0–3.6)
MCH: 31.3 pg (ref 26.0–34.0)
MCHC: 34.9 g/dL (ref 32.0–36.0)
MCV: 89.7 fL (ref 80.0–100.0)
MONO ABS: 0.8 10*3/uL (ref 0.2–1.0)
Monocytes Relative: 9 %
NEUTROS ABS: 7 10*3/uL — AB (ref 1.4–6.5)
NEUTROS PCT: 78 %
Platelets: 261 10*3/uL (ref 150–440)
RBC: 4.57 MIL/uL (ref 4.40–5.90)
RDW: 12.6 % (ref 11.5–14.5)
WBC: 9 10*3/uL (ref 3.8–10.6)

## 2017-12-04 MED ORDER — SODIUM CHLORIDE 0.9 % IV SOLN
1000.0000 mg | Freq: Once | INTRAVENOUS | Status: AC
  Administered 2017-12-04: 1000 mg via INTRAVENOUS
  Filled 2017-12-04: qty 20

## 2017-12-04 MED ORDER — SODIUM CHLORIDE 0.9 % IV SOLN
Freq: Once | INTRAVENOUS | Status: AC
  Administered 2017-12-04: 10:00:00 via INTRAVENOUS
  Filled 2017-12-04: qty 1000

## 2017-12-04 NOTE — Progress Notes (Signed)
Patient denies any concerns today.  

## 2017-12-08 ENCOUNTER — Encounter: Payer: Self-pay | Admitting: Radiation Oncology

## 2017-12-08 ENCOUNTER — Other Ambulatory Visit: Payer: Self-pay

## 2017-12-08 ENCOUNTER — Ambulatory Visit
Admission: RE | Admit: 2017-12-08 | Discharge: 2017-12-08 | Disposition: A | Discharge status: Home / Self Care | Payer: BLUE CROSS/BLUE SHIELD | Source: Ambulatory Visit | Attending: Radiation Oncology | Admitting: Radiation Oncology

## 2017-12-08 VITALS — BP 164/100 | HR 85 | Temp 97.8°F | Resp 18 | Wt 227.1 lb

## 2017-12-08 DIAGNOSIS — C3492 Malignant neoplasm of unspecified part of left bronchus or lung: Secondary | ICD-10-CM

## 2017-12-08 DIAGNOSIS — Z923 Personal history of irradiation: Secondary | ICD-10-CM | POA: Diagnosis not present

## 2017-12-08 DIAGNOSIS — C3491 Malignant neoplasm of unspecified part of right bronchus or lung: Secondary | ICD-10-CM | POA: Insufficient documentation

## 2017-12-08 DIAGNOSIS — Z9221 Personal history of antineoplastic chemotherapy: Secondary | ICD-10-CM | POA: Diagnosis not present

## 2017-12-08 DIAGNOSIS — F17210 Nicotine dependence, cigarettes, uncomplicated: Secondary | ICD-10-CM | POA: Diagnosis not present

## 2017-12-08 NOTE — Progress Notes (Signed)
Radiation Oncology Follow up Note  Name: Gabriel Mendez   Date:   12/08/2017 MRN:  [id number] DOB: [birth year]    This 57 y.o. 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  REFERRING PROVIDER: Arliss Journey*  HPI: patient is a 57 year old male now out 4 months having completed concurrent chemoradiation therapy for stage IIIB squamous cell carcinoma the right lung. He seen today in routine follow-up and is doing well. He specifically denies cough hemoptysis or chest tightness. He is actually breathing well..he recently had a CT scan back in March showing excellent response to therapy with minimal residual hypermetabolic activity in the right hilar region. There is no measurable disease at this time. The adenopathy also had significantly regressed and is no longer hypermetabolic.he is currently on maintenance Durvalumabnd tolerating that well.    COMPLICATIONS OF TREATMENT: none  FOLLOW UP COMPLIANCE: keeps appointments   PHYSICAL EXAM:  BP (!) 164/100   Pulse 85   Temp 97.8 F (36.6 C)   Resp 18   Wt 227 lb 1.2 oz (103 kg)   BMI 34.53 kg/m  Well-developed well-nourished patient in NAD. HEENT reveals PERLA, EOMI, discs not visualized.  Oral cavity is clear. No oral mucosal lesions are identified. Neck is clear without evidence of cervical or supraclavicular adenopathy. Lungs are clear to A&P. Cardiac examination is essentially unremarkable with regular rate and rhythm without murmur rub or thrill. Abdomen is benign with no organomegaly or masses noted. Motor sensory and DTR levels are equal and symmetric in the upper and lower extremities. Cranial nerves II through XII are grossly intact. Proprioception is intact. No peripheral adenopathy or edema is identified. No motor or sensory levels are noted. Crude visual fields are within normal range.  RADIOLOGY RESULTS: PET scan is  reviewed and compatible with the above-stated findings  PLAN: present time patient is doing well he continues on maintenance immunotherapy. He's had excellent response by PET CT criteria. I'm please was overall progress. We'll see him back in 6 months for follow-up. Patient is to call with any concerns.  I would like to take this opportunity to thank you for allowing me to participate in the care of your patient.[name], MD

## 2017-12-17 ENCOUNTER — Inpatient Hospital Stay: Payer: BLUE CROSS/BLUE SHIELD | Attending: Oncology

## 2017-12-17 VITALS — BP 130/88 | HR 99 | Temp 95.2°F | Resp 18 | Wt 222.8 lb

## 2017-12-17 DIAGNOSIS — I10 Essential (primary) hypertension: Secondary | ICD-10-CM | POA: Insufficient documentation

## 2017-12-17 DIAGNOSIS — J449 Chronic obstructive pulmonary disease, unspecified: Secondary | ICD-10-CM | POA: Insufficient documentation

## 2017-12-17 DIAGNOSIS — R739 Hyperglycemia, unspecified: Secondary | ICD-10-CM | POA: Diagnosis not present

## 2017-12-17 DIAGNOSIS — Z5112 Encounter for antineoplastic immunotherapy: Secondary | ICD-10-CM | POA: Diagnosis not present

## 2017-12-17 DIAGNOSIS — Z79899 Other long term (current) drug therapy: Secondary | ICD-10-CM | POA: Diagnosis not present

## 2017-12-17 DIAGNOSIS — M549 Dorsalgia, unspecified: Secondary | ICD-10-CM | POA: Diagnosis not present

## 2017-12-17 DIAGNOSIS — C3492 Malignant neoplasm of unspecified part of left bronchus or lung: Secondary | ICD-10-CM | POA: Diagnosis present

## 2017-12-17 DIAGNOSIS — E119 Type 2 diabetes mellitus without complications: Secondary | ICD-10-CM | POA: Diagnosis not present

## 2017-12-17 DIAGNOSIS — K30 Functional dyspepsia: Secondary | ICD-10-CM | POA: Diagnosis not present

## 2017-12-17 DIAGNOSIS — R05 Cough: Secondary | ICD-10-CM | POA: Insufficient documentation

## 2017-12-17 DIAGNOSIS — Z87891 Personal history of nicotine dependence: Secondary | ICD-10-CM | POA: Diagnosis not present

## 2017-12-17 MED ORDER — SODIUM CHLORIDE 0.9 % IV SOLN
Freq: Once | INTRAVENOUS | Status: AC
  Administered 2017-12-17: 09:00:00 via INTRAVENOUS
  Filled 2017-12-17: qty 1000

## 2017-12-17 MED ORDER — SODIUM CHLORIDE 0.9 % IV SOLN
1000.0000 mg | Freq: Once | INTRAVENOUS | Status: AC
  Administered 2017-12-17: 1000 mg via INTRAVENOUS
  Filled 2017-12-17: qty 20

## 2018-01-01 ENCOUNTER — Inpatient Hospital Stay: Payer: BLUE CROSS/BLUE SHIELD

## 2018-01-01 ENCOUNTER — Other Ambulatory Visit: Payer: Self-pay

## 2018-01-01 ENCOUNTER — Encounter: Payer: Self-pay | Admitting: *Deleted

## 2018-01-01 ENCOUNTER — Inpatient Hospital Stay (HOSPITAL_BASED_OUTPATIENT_CLINIC_OR_DEPARTMENT_OTHER): Payer: BLUE CROSS/BLUE SHIELD | Admitting: Oncology

## 2018-01-01 ENCOUNTER — Encounter: Payer: Self-pay | Admitting: Oncology

## 2018-01-01 DIAGNOSIS — R739 Hyperglycemia, unspecified: Secondary | ICD-10-CM

## 2018-01-01 DIAGNOSIS — C3492 Malignant neoplasm of unspecified part of left bronchus or lung: Secondary | ICD-10-CM

## 2018-01-01 DIAGNOSIS — Z5112 Encounter for antineoplastic immunotherapy: Secondary | ICD-10-CM | POA: Diagnosis not present

## 2018-01-01 DIAGNOSIS — I10 Essential (primary) hypertension: Secondary | ICD-10-CM

## 2018-01-01 DIAGNOSIS — C3490 Malignant neoplasm of unspecified part of unspecified bronchus or lung: Secondary | ICD-10-CM

## 2018-01-01 DIAGNOSIS — K30 Functional dyspepsia: Secondary | ICD-10-CM | POA: Diagnosis not present

## 2018-01-01 DIAGNOSIS — R05 Cough: Secondary | ICD-10-CM

## 2018-01-01 DIAGNOSIS — Z79899 Other long term (current) drug therapy: Secondary | ICD-10-CM

## 2018-01-01 DIAGNOSIS — E119 Type 2 diabetes mellitus without complications: Secondary | ICD-10-CM

## 2018-01-01 DIAGNOSIS — J449 Chronic obstructive pulmonary disease, unspecified: Secondary | ICD-10-CM

## 2018-01-01 DIAGNOSIS — M549 Dorsalgia, unspecified: Secondary | ICD-10-CM

## 2018-01-01 DIAGNOSIS — Z87891 Personal history of nicotine dependence: Secondary | ICD-10-CM

## 2018-01-01 LAB — COMPREHENSIVE METABOLIC PANEL
ALBUMIN: 4.1 g/dL (ref 3.5–5.0)
ALT: 21 U/L (ref 0–44)
AST: 20 U/L (ref 15–41)
Alkaline Phosphatase: 95 U/L (ref 38–126)
Anion gap: 13 (ref 5–15)
BUN: 17 mg/dL (ref 6–20)
CHLORIDE: 98 mmol/L (ref 98–111)
CO2: 24 mmol/L (ref 22–32)
Calcium: 9.3 mg/dL (ref 8.9–10.3)
Creatinine, Ser: 0.83 mg/dL (ref 0.61–1.24)
GFR calc Af Amer: >60 mL/min (ref >60)
GFR calc non Af Amer: >60 mL/min (ref >60)
GLUCOSE: 193 mg/dL — AB (ref 70–99)
Potassium: 3.6 mmol/L (ref 3.5–5.1)
Sodium: 135 mmol/L (ref 135–145)
Total Bilirubin: 0.7 mg/dL (ref 0.3–1.2)
Total Protein: 7.2 g/dL (ref 6.5–8.1)

## 2018-01-01 LAB — CBC WITH DIFFERENTIAL/PLATELET
Basophils Absolute: 0 10*3/uL (ref 0–0.1)
Basophils Relative: 0 %
EOS ABS: 0.4 10*3/uL (ref 0–0.7)
Eosinophils Relative: 5 %
HEMATOCRIT: 43.6 % (ref 40.0–52.0)
HEMOGLOBIN: 15.1 g/dL (ref 13.0–18.0)
LYMPHS ABS: 1 10*3/uL (ref 1.0–3.6)
Lymphocytes Relative: 11 %
MCH: 30.8 pg (ref 26.0–34.0)
MCHC: 34.6 g/dL (ref 32.0–36.0)
MCV: 88.9 fL (ref 80.0–100.0)
MONO ABS: 0.7 10*3/uL (ref 0.2–1.0)
MONOS PCT: 8 %
Neutro Abs: 6.7 10*3/uL — ABNORMAL HIGH (ref 1.4–6.5)
Neutrophils Relative %: 76 %
Platelets: 302 10*3/uL (ref 150–440)
RBC: 4.9 MIL/uL (ref 4.40–5.90)
RDW: 13 % (ref 11.5–14.5)
WBC: 8.8 10*3/uL (ref 3.8–10.6)

## 2018-01-01 MED ORDER — SODIUM CHLORIDE 0.9 % IV SOLN
1000.0000 mg | Freq: Once | INTRAVENOUS | Status: AC
  Administered 2018-01-01: 1000 mg via INTRAVENOUS
  Filled 2018-01-01: qty 20

## 2018-01-01 MED ORDER — SODIUM CHLORIDE 0.9 % IV SOLN
Freq: Once | INTRAVENOUS | Status: AC
  Administered 2018-01-01: 10:00:00 via INTRAVENOUS
  Filled 2018-01-01: qty 1000

## 2018-01-01 NOTE — Progress Notes (Signed)
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  INTERVAL HISTORY: Patient returns to clinic today for further evaluation and consideration of cycle 7 of maintenance durvalumab.  Continues to feel well.  Intermittent chest congestion and cough but denies fever.  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  States occasionally has trouble breathing with the "hot humid air".  He denies any neurological complaints, chest pain, shortness of breath or hemoptysis.  He maintains a good appetite denies weight loss.  He denies nausea, vomiting, constipation, diarrhea or urine.  REVIEW OF SYSTEMS:   Review of Systems  Constitutional: Negative.  Negative for chills, fever, malaise/fatigue and weight loss.  HENT: Negative for congestion and ear pain.   Eyes: Negative.  Negative for blurred vision and double vision.  Respiratory: Positive for cough and shortness of breath. Negative for sputum production.   Cardiovascular: Negative.  Negative for chest pain, palpitations and leg swelling.  Gastrointestinal: Negative.  Negative for abdominal pain, constipation, diarrhea, nausea and vomiting.  Genitourinary: Negative for dysuria, frequency and urgency.  Musculoskeletal: Positive for back pain. Negative for falls.  Skin: Negative.  Negative for rash.  Neurological: Negative.  Negative for weakness and headaches.  Endo/Heme/Allergies: Negative.  Does not bruise/bleed easily.  Psychiatric/Behavioral: Negative.  Negative for depression. The patient is not nervous/anxious and does not have insomnia.     As per HPI. Otherwise, a complete review of systems is negative.  PAST MEDICAL HISTORY: Past  Medical History:  Diagnosis Date  . COPD (chronic obstructive pulmonary disease) ([place])   . Diabetes mellitus without complication ([place])   . Hypertension   . Pneumonia     PAST SURGICAL HISTORY: Past Surgical History:  Procedure Laterality Date  . ENDOBRONCHIAL ULTRASOUND N/A 05/12/2017   Procedure: ENDOBRONCHIAL ULTRASOUND;  Surgeon: Laverle Hobby, MD;  Location: [facility] ORS;  Service: Pulmonary;  Laterality: N/A;  . FRACTURE SURGERY Left    ANKLE X 2  . TONSILLECTOMY      FAMILY HISTORY: Family History  Problem Relation Age of Onset  . Stroke Mother   . Diabetes Mother   . Hypertension Mother   . Diabetes Father   . Hypertension Father   . Diabetes Sister   . Diabetes Brother   . Heart attack Paternal Uncle   . Stroke Maternal Grandmother     ADVANCED DIRECTIVES (Y/N):  N  HEALTH MAINTENANCE: Social History   Tobacco Use  . Smoking status: Former Smoker    Packs/day: 1.50    Last attempt to quit: 07/31/2017    Years since quitting: 0.4  . Smokeless tobacco: Never Used  Substance Use Topics  . Alcohol use: No  . Drug use: No     Colonoscopy:  PAP:  Bone density:  Lipid panel:  Allergies  Allergen Reactions  . Shrimp [Shellfish Allergy] Nausea And Vomiting    Current Outpatient Medications  Medication Sig Dispense Refill  . albuterol (PROVENTIL HFA;VENTOLIN HFA) 108 (90 BASE) MCG/ACT inhaler Inhale 2 puffs into the lungs every 6 (six) hours as needed for wheezing or shortness of breath.    Marland Kitchen albuterol (PROVENTIL) (2.5 MG/3ML) 0.083% nebulizer solution Take 2.5 mg every 6 (six) hours as needed by nebulization for wheezing or  shortness of breath.    . beclomethasone (QVAR) 80 MCG/ACT inhaler Inhale 1 puff into the lungs 2 (two) times daily.    Marland Kitchen glipiZIDE (GLUCOTROL XL) 10 MG 24 hr tablet Take 10 mg by mouth daily with breakfast.    . ibuprofen (ADVIL,MOTRIN) 200 MG tablet Take 400-800 mg every 8 (eight) hours as needed by mouth (for pain.).    Marland Kitchen  lisinopril-hydrochlorothiazide (PRINZIDE,ZESTORETIC) 20-25 MG tablet Take 1 tablet by mouth daily.    . metFORMIN (GLUCOPHAGE) 1000 MG tablet Take 1,000 mg by mouth 2 (two) times daily.    Marland Kitchen omeprazole (PRILOSEC) 20 MG capsule Take 1 capsule (20 mg total) by mouth daily. 30 capsule 2  . umeclidinium-vilanterol (ANORO ELLIPTA) 62.5-25 MCG/INH AEPB Inhale 1 puff into the lungs daily. Take in addition to Qvar. 1 each 5  . sucralfate (CARAFATE) 1 g tablet Take 1 tablet (1 g total) by mouth 3 (three) times daily. Dissolve in 2-3 tbsp warm water, swish and swallow. (Patient not taking: Reported on 01/01/2018) 90 tablet 3  . traMADol (ULTRAM) 50 MG tablet Take 1 tablet (50 mg total) by mouth every 6 (six) hours as needed. (Patient not taking: Reported on 01/01/2018) 30 tablet 0   No current facility-administered medications for this visit.     OBJECTIVE: Vitals:   01/01/18 0856  BP: (!) 160/74  Pulse: 98  Resp: (!) 22  Temp: (!) 97.2 F (36.2 C)  SpO2: 98%     Body mass index is 33.3 kg/m.    ECOG FS:0 - Asymptomatic  Physical Exam  Constitutional: He is oriented to person, place, and time and well-developed, well-nourished, and in no distress. Vital signs are normal.  HENT:  Head: Normocephalic and atraumatic.  Eyes: Pupils are equal, round, and reactive to light.  Neck: Normal range of motion.  Cardiovascular: Normal rate, regular rhythm and normal heart sounds.  No murmur heard. Pulmonary/Chest: Effort normal. He has decreased breath sounds in the right upper field, the right lower field, the left upper field and the left lower field. He has no wheezes. He has rhonchi in the right upper field and the left upper field.  Abdominal: Soft. Normal appearance and bowel sounds are normal. He exhibits no distension. There is no tenderness.  Musculoskeletal: Normal range of motion. He exhibits no edema.  Neurological: He is alert and oriented to person, place, and time. Gait normal.  Skin: Skin is  warm and dry. No rash noted.  Psychiatric: Mood, memory, affect and judgment normal.    LAB RESULTS:  Lab Results  Component Value Date   NA 135 01/01/2018   K 3.6 01/01/2018   CL 98 01/01/2018   CO2 24 01/01/2018   GLUCOSE 193 (H) 01/01/2018   BUN 17 01/01/2018   CREATININE 0.83 01/01/2018   CALCIUM 9.3 01/01/2018   PROT 7.2 01/01/2018   ALBUMIN 4.1 01/01/2018   AST 20 01/01/2018   ALT 21 01/01/2018   ALKPHOS 95 01/01/2018   BILITOT 0.7 01/01/2018   GFRNONAA >60 01/01/2018   GFRAA >60 01/01/2018    Lab Results  Component Value Date   WBC 8.8 01/01/2018   NEUTROABS 6.7 (H) 01/01/2018   HGB 15.1 01/01/2018   HCT 43.6 01/01/2018   MCV 88.9 01/01/2018   PLT 302 01/01/2018     STUDIES: No results found.  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  PLAN:    1. 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  Patient was also discussed at cancer conference.  MRI of the brain is negative. PET scan results from September 08, 2017 reviewed independently with excellent response to treatment.    Proceed today with cycle 7 of maintenance durvalumab.  Treatment will continue for a total of 12 months.  Patient continues to decline PORT placement. RTC in 2 weeks for consideration of cycle 8 and then in 4 weeks for further evaluation and consideration of cycle 9.   Per Dr. Gary Fleet last note, will get repeat PET scan this month.  Orders placed.  2.  Hyperglycemia: Chronic and unchanged.  DC'd dexamethasone as pre-med.  Continue Metformin and glipizide as prescribed.  3.   Indigestion: Continue omeprazole as prescribed.  States this is helping tremendously.  4.  Pain: Has occasional back pain.  Has not been using tramadol.  Uses with relief.  Cough/congestion:  5.  Cough/congestion: Continue  albuterol, Qvar and Anoro inhaler.   6.  Hypertension: Stable today.  Continue lisinopril-hydrochlorothiazide 20-25 mg  daily.  Greater than 50% was spent in counseling and coordination of care with this patient including but not limited to discussion of the relevant topics above (See A&P) including, but not limited to diagnosis and management of acute and chronic medical conditions.   Patient expressed understanding and was in agreement with this plan. He also understands that He can call clinic at any time with any questions, concerns, or complaints.   Cancer Staging Squamous cell lung cancer, left [facility]) Staging form: Lung, AJCC 8th Edition - Clinical stage from 05/16/2017: Stage IIIB (cT3, cN2, cM0) - Signed by Lloyd Huger, MD on 05/16/2017   Jacquelin Hawking, NP   01/01/2018 9:03 AM

## 2018-01-02 LAB — THYROID PANEL WITH TSH
FREE THYROXINE INDEX: 2.2 (ref 1.2–4.9)
T3 UPTAKE RATIO: 25 % (ref 24–39)
T4 TOTAL: 8.8 ug/dL (ref 4.5–12.0)
TSH: 1.07 u[IU]/mL (ref 0.450–4.500)

## 2018-01-12 ENCOUNTER — Other Ambulatory Visit: Payer: Self-pay | Admitting: Oncology

## 2018-01-12 DIAGNOSIS — C3492 Malignant neoplasm of unspecified part of left bronchus or lung: Secondary | ICD-10-CM

## 2018-01-15 ENCOUNTER — Inpatient Hospital Stay: Payer: BLUE CROSS/BLUE SHIELD

## 2018-01-15 ENCOUNTER — Inpatient Hospital Stay (HOSPITAL_BASED_OUTPATIENT_CLINIC_OR_DEPARTMENT_OTHER): Payer: BLUE CROSS/BLUE SHIELD | Admitting: Oncology

## 2018-01-15 ENCOUNTER — Encounter: Payer: Self-pay | Admitting: Oncology

## 2018-01-15 ENCOUNTER — Inpatient Hospital Stay: Payer: BLUE CROSS/BLUE SHIELD | Attending: Oncology

## 2018-01-15 ENCOUNTER — Other Ambulatory Visit: Payer: Self-pay

## 2018-01-15 VITALS — BP 141/81 | HR 88

## 2018-01-15 VITALS — BP 150/103 | HR 89 | Temp 96.7°F | Resp 22 | Wt 222.0 lb

## 2018-01-15 DIAGNOSIS — J449 Chronic obstructive pulmonary disease, unspecified: Secondary | ICD-10-CM | POA: Diagnosis not present

## 2018-01-15 DIAGNOSIS — C3492 Malignant neoplasm of unspecified part of left bronchus or lung: Secondary | ICD-10-CM | POA: Insufficient documentation

## 2018-01-15 DIAGNOSIS — R5383 Other fatigue: Secondary | ICD-10-CM | POA: Insufficient documentation

## 2018-01-15 DIAGNOSIS — M549 Dorsalgia, unspecified: Secondary | ICD-10-CM

## 2018-01-15 DIAGNOSIS — R531 Weakness: Secondary | ICD-10-CM | POA: Insufficient documentation

## 2018-01-15 DIAGNOSIS — K30 Functional dyspepsia: Secondary | ICD-10-CM | POA: Diagnosis not present

## 2018-01-15 DIAGNOSIS — E1165 Type 2 diabetes mellitus with hyperglycemia: Secondary | ICD-10-CM

## 2018-01-15 DIAGNOSIS — Z87891 Personal history of nicotine dependence: Secondary | ICD-10-CM | POA: Insufficient documentation

## 2018-01-15 DIAGNOSIS — Z5112 Encounter for antineoplastic immunotherapy: Secondary | ICD-10-CM | POA: Diagnosis not present

## 2018-01-15 DIAGNOSIS — I10 Essential (primary) hypertension: Secondary | ICD-10-CM | POA: Diagnosis not present

## 2018-01-15 DIAGNOSIS — C3490 Malignant neoplasm of unspecified part of unspecified bronchus or lung: Secondary | ICD-10-CM

## 2018-01-15 LAB — COMPREHENSIVE METABOLIC PANEL
ALK PHOS: 84 U/L (ref 38–126)
ALT: 20 U/L (ref 0–44)
AST: 21 U/L (ref 15–41)
Albumin: 3.9 g/dL (ref 3.5–5.0)
Anion gap: 12 (ref 5–15)
BUN: 15 mg/dL (ref 6–20)
CO2: 25 mmol/L (ref 22–32)
Calcium: 9.3 mg/dL (ref 8.9–10.3)
Chloride: 99 mmol/L (ref 98–111)
Creatinine, Ser: 0.78 mg/dL (ref 0.61–1.24)
Glucose, Bld: 184 mg/dL — ABNORMAL HIGH (ref 70–99)
Potassium: 3.8 mmol/L (ref 3.5–5.1)
Sodium: 136 mmol/L (ref 135–145)
TOTAL PROTEIN: 7 g/dL (ref 6.5–8.1)
Total Bilirubin: 0.4 mg/dL (ref 0.3–1.2)

## 2018-01-15 LAB — CBC WITH DIFFERENTIAL/PLATELET
BASOS ABS: 0.1 10*3/uL (ref 0–0.1)
BASOS PCT: 1 %
Eosinophils Absolute: 0.4 10*3/uL (ref 0–0.7)
Eosinophils Relative: 5 %
HCT: 43.6 % (ref 40.0–52.0)
Hemoglobin: 14.9 g/dL (ref 13.0–18.0)
Lymphocytes Relative: 12 %
Lymphs Abs: 1.1 10*3/uL (ref 1.0–3.6)
MCH: 30.1 pg (ref 26.0–34.0)
MCHC: 34.1 g/dL (ref 32.0–36.0)
MCV: 88.3 fL (ref 80.0–100.0)
Monocytes Absolute: 0.8 10*3/uL (ref 0.2–1.0)
Monocytes Relative: 9 %
Neutro Abs: 6.6 10*3/uL — ABNORMAL HIGH (ref 1.4–6.5)
Neutrophils Relative %: 73 %
Platelets: 289 10*3/uL (ref 150–440)
RBC: 4.94 MIL/uL (ref 4.40–5.90)
RDW: 13.1 % (ref 11.5–14.5)
WBC: 9 10*3/uL (ref 3.8–10.6)

## 2018-01-15 MED ORDER — SODIUM CHLORIDE 0.9 % IV SOLN
1000.0000 mg | Freq: Once | INTRAVENOUS | Status: AC
  Administered 2018-01-15: 1000 mg via INTRAVENOUS
  Filled 2018-01-15: qty 20

## 2018-01-15 MED ORDER — SODIUM CHLORIDE 0.9 % IV SOLN
Freq: Once | INTRAVENOUS | Status: AC
  Administered 2018-01-15: 10:00:00 via INTRAVENOUS
  Filled 2018-01-15: qty 1000

## 2018-01-15 NOTE — Progress Notes (Signed)
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  INTERVAL HISTORY: Patient returns to clinic for further evaluation and consideration of cycle 8 of maintenance nivolumab.  He continues to feel well.  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  Continues to have trouble breathing with activity.  He denies any neurological complaints, chest pain, or hemoptysis.  He is a good appetite denies weight loss.  He denies nausea, vomiting, constipation or diarrhea.  REVIEW OF SYSTEMS:   Review of Systems  Constitutional: Positive for malaise/fatigue. Negative for chills, fever and weight loss.  HENT: Negative for congestion and ear pain.   Eyes: Negative.  Negative for blurred vision and double vision.  Respiratory: Positive for cough, shortness of breath (With exertion) and wheezing. Negative for sputum production.   Cardiovascular: Negative.  Negative for chest pain, palpitations and leg swelling.  Gastrointestinal: Negative.  Negative for abdominal pain, constipation, diarrhea, nausea and vomiting.  Genitourinary: Negative for dysuria, frequency and urgency.  Musculoskeletal: Positive for back pain (Intermittent). Negative for falls.  Skin: Negative.  Negative for rash.  Neurological: Positive for weakness. Negative for headaches.  Endo/Heme/Allergies: Negative.  Does not bruise/bleed easily.  Psychiatric/Behavioral: Negative.  Negative for depression. The patient is not nervous/anxious and does not have insomnia.     As per HPI. Otherwise, a complete review of systems is negative.  PAST MEDICAL HISTORY: Past Medical History:  Diagnosis Date  . COPD (chronic obstructive  pulmonary disease) ([place])   . Diabetes mellitus without complication ([place])   . Hypertension   . Pneumonia     PAST SURGICAL HISTORY: Past Surgical History:  Procedure Laterality Date  . ENDOBRONCHIAL ULTRASOUND N/A 05/12/2017   Procedure: ENDOBRONCHIAL ULTRASOUND;  Surgeon: Laverle Hobby, MD;  Location: [facility] ORS;  Service: Pulmonary;  Laterality: N/A;  . FRACTURE SURGERY Left    ANKLE X 2  . TONSILLECTOMY      FAMILY HISTORY: Family History  Problem Relation Age of Onset  . Stroke Mother   . Diabetes Mother   . Hypertension Mother   . Diabetes Father   . Hypertension Father   . Diabetes Sister   . Diabetes Brother   . Heart attack Paternal Uncle   . Stroke Maternal Grandmother     ADVANCED DIRECTIVES (Y/N):  N  HEALTH MAINTENANCE: Social History   Tobacco Use  . Smoking status: Former Smoker    Packs/day: 1.50    Last attempt to quit: 07/31/2017    Years since quitting: 0.4  . Smokeless tobacco: Never Used  Substance Use Topics  . Alcohol use: No  . Drug use: No     Colonoscopy:  PAP:  Bone density:  Lipid panel:  Allergies  Allergen Reactions  . Shrimp [Shellfish Allergy] Nausea And Vomiting    Current Outpatient Medications  Medication Sig Dispense Refill  . albuterol (PROVENTIL HFA;VENTOLIN HFA) 108 (90 BASE) MCG/ACT inhaler Inhale 2 puffs into the lungs every 6 (six) hours as needed for wheezing or shortness of breath.    Marland Kitchen albuterol (PROVENTIL) (2.5 MG/3ML) 0.083% nebulizer solution Take 2.5 mg every 6 (six) hours as needed by nebulization for wheezing or shortness of breath.    . beclomethasone (QVAR) 80 MCG/ACT  inhaler Inhale 1 puff into the lungs 2 (two) times daily.    Marland Kitchen glipiZIDE (GLUCOTROL XL) 10 MG 24 hr tablet Take 10 mg by mouth daily with breakfast.    . ibuprofen (ADVIL,MOTRIN) 200 MG tablet Take 400-800 mg every 8 (eight) hours as needed by mouth (for pain.).    Marland Kitchen lisinopril-hydrochlorothiazide (PRINZIDE,ZESTORETIC) 20-25 MG tablet  Take 1 tablet by mouth daily.    . metFORMIN (GLUCOPHAGE) 1000 MG tablet Take 1,000 mg by mouth 2 (two) times daily.    Marland Kitchen omeprazole (PRILOSEC) 20 MG capsule Take 1 capsule (20 mg total) by mouth daily. 30 capsule 2  . umeclidinium-vilanterol (ANORO ELLIPTA) 62.5-25 MCG/INH AEPB Inhale 1 puff into the lungs daily. Take in addition to Qvar. 1 each 5   No current facility-administered medications for this visit.     OBJECTIVE: Vitals:   01/15/18 0934  BP: (!) 150/103  Pulse: 89  Resp: (!) 22  Temp: (!) 96.7 F (35.9 C)  SpO2: 95%     Body mass index is 33.75 kg/m.    ECOG FS:0 - Asymptomatic  Physical Exam  Constitutional: He is oriented to person, place, and time and well-developed, well-nourished, and in no distress. Vital signs are normal.  HENT:  Head: Normocephalic and atraumatic.  Eyes: Pupils are equal, round, and reactive to light.  Neck: Normal range of motion.  Cardiovascular: Normal rate, regular rhythm and normal heart sounds.  No murmur heard. Pulmonary/Chest: Effort normal. He has decreased breath sounds. He has wheezes in the right upper field and the left upper field.  Abdominal: Soft. Normal appearance and bowel sounds are normal. He exhibits no distension. There is no tenderness.  Musculoskeletal: Normal range of motion. He exhibits no edema.  Neurological: He is alert and oriented to person, place, and time. Gait normal.  Skin: Skin is warm and dry. No rash noted.  Psychiatric: Mood, memory, affect and judgment normal.    LAB RESULTS:  Lab Results  Component Value Date   NA 136 01/15/2018   K 3.8 01/15/2018   CL 99 01/15/2018   CO2 25 01/15/2018   GLUCOSE 184 (H) 01/15/2018   BUN 15 01/15/2018   CREATININE 0.78 01/15/2018   CALCIUM 9.3 01/15/2018   PROT 7.0 01/15/2018   ALBUMIN 3.9 01/15/2018   AST 21 01/15/2018   ALT 20 01/15/2018   ALKPHOS 84 01/15/2018   BILITOT 0.4 01/15/2018   GFRNONAA >60 01/15/2018   GFRAA >60 01/15/2018    Lab Results    Component Value Date   WBC 9.0 01/15/2018   NEUTROABS 6.6 (H) 01/15/2018   HGB 14.9 01/15/2018   HCT 43.6 01/15/2018   MCV 88.3 01/15/2018   PLT 289 01/15/2018     STUDIES: No results found.  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  PLAN:    1. 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  Patient was also discussed at cancer conference.  MRI of the brain is negative. PET scan results from September 08, 2017 reviewed independently with excellent response to treatment.    Proceed today with cycle 10 of maintenance nivolumab.  His treatment will continue for 12 months.  He continues to decline port placement at this time.  RTC in 2 weeks for consideration of cycle 11 and then in 4 weeks for further evaluation and consideration of cycle 12  PET scan was denied.  Will get CT abdomen /pelvis/chest. Orders placed.  2.  Hyperglycemia: Blood sugar Chronic and unchanged.  DC'd dexamethasone as pre-med.  Continue Metformin and glipizide as prescribed.  3.   Indigestion: Continue omeprazole as prescribed.  This is continued to help.  4.  Pain: Intermittent back pain, uses Advil with relief.  5.  Cough/congestion: d/t underlying cancer and codp. Continue  albuterol, Qvar and Anoro inhaler.   6.  Hypertension: States is always elevated.  Today's blood pressure is 159/103.  Will recheck prior to infusion continue lisinopril-hydrochlorothiazide 20-25 mg daily.  Recheck in the infusion and blood pressure is 141/81.   Greater than 50% was spent in counseling and coordination of care with this patient including but not limited to discussion of the relevant topics above (See A&P) including, but not limited to diagnosis and management of acute and chronic medical conditions.   Patient expressed understanding and was in agreement with this plan. He also understands that He can call clinic at any time with any questions, concerns,  or complaints.   Cancer Staging Squamous cell lung cancer, left [facility]) Staging form: Lung, AJCC 8th Edition - Clinical stage from 05/16/2017: Stage IIIB (cT3, cN2, cM0) - Signed by Lloyd Huger, MD on 05/16/2017   Jacquelin Hawking, NP   01/15/2018 10:08 AM

## 2018-01-16 LAB — THYROID PANEL WITH TSH
Free Thyroxine Index: 2.1 (ref 1.2–4.9)
T3 Uptake Ratio: 24 % (ref 24–39)
T4, Total: 8.8 ug/dL (ref 4.5–12.0)
TSH: 1.28 u[IU]/mL (ref 0.450–4.500)

## 2018-01-21 ENCOUNTER — Encounter: Payer: Self-pay | Admitting: *Deleted

## 2018-01-25 NOTE — Progress Notes (Signed)
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  INTERVAL HISTORY: Patient returns to clinic today for further evaluation and consideration of cycle 11 of maintenance Durvalumab.  He has increased congestion, cough, and low-grade fevers over the past week.  He also noted increasing shortness of breath.  He has no neurologic complaints. He has a good appetite and denies weight loss.  He has no nausea, vomiting, constipation, or diarrhea.  He has no urinary complaints.  Patient offers no further specific complaints today.  REVIEW OF SYSTEMS:   Review of Systems  Constitutional: Negative.  Negative for fever and weight loss.  HENT: Positive for congestion.   Respiratory: Positive for cough and shortness of breath. Negative for hemoptysis.   Cardiovascular: Negative.  Negative for chest pain and leg swelling.  Gastrointestinal: Negative.  Negative for abdominal pain and heartburn.  Genitourinary: Negative.  Negative for dysuria.  Musculoskeletal: Negative.  Negative for joint pain.  Skin: Negative.  Negative for rash.  Neurological: Negative.  Negative for sensory change, focal weakness and weakness.  Psychiatric/Behavioral: Negative.  The patient is not nervous/anxious.     As per HPI. Otherwise, a complete review of systems is negative.  PAST MEDICAL HISTORY: Past Medical History:  Diagnosis Date  . COPD (chronic obstructive pulmonary disease) ([place])   . Diabetes mellitus without complication ([place])   . Hypertension   . Lung cancer ([place]) 05/2017   Hx Chemo + rad tx's.  . Pneumonia     PAST SURGICAL HISTORY: Past Surgical History:  Procedure Laterality Date  . ENDOBRONCHIAL ULTRASOUND N/A  05/12/2017   Procedure: ENDOBRONCHIAL ULTRASOUND;  Surgeon: Laverle Hobby, MD;  Location: [facility] ORS;  Service: Pulmonary;  Laterality: N/A;  . FRACTURE SURGERY Left    ANKLE X 2  . TONSILLECTOMY      FAMILY HISTORY: Family History  Problem Relation Age of Onset  . Stroke Mother   . Diabetes Mother   . Hypertension Mother   . Diabetes Father   . Hypertension Father   . Diabetes Sister   . Diabetes Brother   . Heart attack Paternal Uncle   . Stroke Maternal Grandmother     ADVANCED DIRECTIVES (Y/N):  N  HEALTH MAINTENANCE: Social History   Tobacco Use  . Smoking status: Former Smoker    Packs/day: 1.50    Last attempt to quit: 07/31/2017    Years since quitting: 0.5  . Smokeless tobacco: Never Used  Substance Use Topics  . Alcohol use: No  . Drug use: No     Colonoscopy:  PAP:  Bone density:  Lipid panel:  Allergies  Allergen Reactions  . Shrimp [Shellfish Allergy] Nausea And Vomiting    Current Outpatient Medications  Medication Sig Dispense Refill  . albuterol (PROVENTIL HFA;VENTOLIN HFA) 108 (90 BASE) MCG/ACT inhaler Inhale 2 puffs into the lungs every 6 (six) hours as needed for wheezing or shortness of breath.    Marland Kitchen albuterol (PROVENTIL) (2.5 MG/3ML) 0.083% nebulizer solution Take 2.5 mg every 6 (six) hours as needed by nebulization for wheezing or shortness of breath.    Jearl Klinefelter ELLIPTA 62.5-25 MCG/INH AEPB INHALE 1 PUFF INTO THE LUNGS DAILY IN ADDITION TO QVAR 60 each 2  . beclomethasone (QVAR) 80 MCG/ACT inhaler  Inhale 1 puff into the lungs 2 (two) times daily.    Marland Kitchen glipiZIDE (GLUCOTROL XL) 10 MG 24 hr tablet Take 10 mg by mouth daily with breakfast.    . ibuprofen (ADVIL,MOTRIN) 200 MG tablet Take 400-800 mg every 8 (eight) hours as needed by mouth (for pain.).    Marland Kitchen lisinopril-hydrochlorothiazide (PRINZIDE,ZESTORETIC) 20-25 MG tablet Take 1 tablet by mouth daily.    . metFORMIN (GLUCOPHAGE) 1000 MG tablet Take 1,000 mg by mouth 2 (two) times daily.     Marland Kitchen omeprazole (PRILOSEC) 20 MG capsule TAKE 1 CAPSULE(20 MG) BY MOUTH DAILY 30 capsule 0  . levofloxacin (LEVAQUIN) 500 MG tablet Take 1 tablet (500 mg total) by mouth daily. 7 tablet 0   No current facility-administered medications for this visit.     OBJECTIVE: Vitals:   01/29/18 0944  BP: (!) 145/93  Pulse: (!) 101  Resp: 18  Temp: 98.2 F (36.8 C)  SpO2: 95%     Body mass index is 33.85 kg/m.    ECOG FS:0 - Asymptomatic  General: Well-developed, well-nourished, no acute distress. Eyes: Pink conjunctiva, anicteric sclera. HEENT: Normocephalic, moist mucous membranes, clear oropharnyx. Lungs: Scattered wheezing throughout.   Heart: Regular rate and rhythm. No rubs, murmurs, or gallops. Abdomen: Soft, nontender, nondistended. No organomegaly noted, normoactive bowel sounds. Musculoskeletal: No edema, cyanosis, or clubbing. Neuro: Alert, answering all questions appropriately. Cranial nerves grossly intact. Skin: No rashes or petechiae noted. Psych: Normal affect.  LAB RESULTS:  Lab Results  Component Value Date   NA 133 (L) 01/29/2018   K 3.9 01/29/2018   CL 95 (L) 01/29/2018   CO2 25 01/29/2018   GLUCOSE 192 (H) 01/29/2018   BUN 21 (H) 01/29/2018   CREATININE 0.79 01/29/2018   CALCIUM 9.0 01/29/2018   PROT 7.0 01/29/2018   ALBUMIN 3.8 01/29/2018   AST 18 01/29/2018   ALT 20 01/29/2018   ALKPHOS 94 01/29/2018   BILITOT 0.5 01/29/2018   GFRNONAA >60 01/29/2018   GFRAA >60 01/29/2018    Lab Results  Component Value Date   WBC 13.9 (H) 01/29/2018   NEUTROABS 11.7 (H) 01/29/2018   HGB 14.1 01/29/2018   HCT 41.1 01/29/2018   MCV 88.7 01/29/2018   PLT 295 01/29/2018     STUDIES: Dg Chest 2 View  Result Date: 01/29/2018 CLINICAL DATA:  Cough and fever with shortness of breath for 1 week EXAM: CHEST - 2 VIEW COMPARISON:  01/26/2018 chest CT FINDINGS: There is post treatment scarring and architectural distortion in the right upper lobe with thickening of the  right hilum. This was recently evaluated by CT. This opacity appears increased in both the frontal projections when compared to scanogram at prior CT. Normal heart size and aortic contours. IMPRESSION: Chronic post treatment changes in the right upper lobe. Density appears increased from recent chest CT, suspicious for superimposed pneumonia. Electronically Signed   By: Monte Fantasia M.D.   On: 01/29/2018 14:35   Ct Chest W Contrast  Result Date: 01/26/2018 CLINICAL DATA:  57 year old male for restaging and subsequent treatment strategy for squamous cell carcinoma lung. EXAM: CT CHEST, ABDOMEN, AND PELVIS WITH CONTRAST TECHNIQUE: Multidetector CT imaging of the chest, abdomen and pelvis was performed following the standard protocol during bolus administration of intravenous contrast. CONTRAST:  155mL OMNIPAQUE IOHEXOL 300 MG/ML  SOLN COMPARISON:  09/08/2017 and 05/27/2017 PET CTs.  04/28/2017 chest CT FINDINGS: CT CHEST FINDINGS Cardiovascular: Normal heart size noted. Coronary artery calcifications are present. Mild aortic atherosclerotic calcifications noted  without aneurysm. No pericardial effusion. Mediastinum/Nodes: No enlarged lymph nodes. Index low RIGHT paratracheal node now measures 6 mm (series 2: Image 24), previously 11 mm. Index 9 mm subcarinal node (2:30) is unchanged. No new or enlarging lymph nodes identified. Mild soft tissue fullness in the RIGHT hilar region is unchanged. Lungs/Pleura: Streaky soft tissue opacity within the RIGHT UPPER lobe has not significantly changed. No new suspicious opacities identified. The LEFT lung is clear. No pleural effusion or pneumothorax. Musculoskeletal: No acute or suspicious bony abnormalities noted. Remote RIGHT rib fractures are present. CT ABDOMEN PELVIS FINDINGS Hepatobiliary: Mild hepatic steatosis again noted. No focal hepatic abnormalities noted. The gallbladder is unremarkable. No biliary dilatation. Pancreas: Unremarkable Spleen: Unremarkable  Adrenals/Urinary Tract: Unchanged 2.4 cm LEFT adrenal adenoma. Fullness of the RIGHT adrenal gland again noted. Kidneys and bladder are unremarkable except for a stable punctate nonobstructing LEFT renal calculus and LEFT renal cyst. Stomach/Bowel: Stomach is within normal limits. Appendix appears normal. No evidence of bowel wall thickening, distention, or inflammatory changes. Vascular/Lymphatic: Aortic atherosclerosis. No enlarged abdominal or pelvic lymph nodes. Reproductive: Mild prostate enlargement again noted. Other: No ascites, focal collection or pneumoperitoneum. Musculoskeletal: No acute or suspicious bony abnormalities. IMPRESSION: 1. Little significant change since 09/08/2017 PET CT. No new or progressive malignancy or metastatic disease. Unchanged RIGHT UPPER lobe streaky opacities. 2. Hepatic steatosis 3. Punctate nonobstructing LEFT renal calculus 4. Coronary artery and aortic Atherosclerosis (ICD10-I70.0). Electronically Signed   By: Margarette Canada M.D.   On: 01/26/2018 12:31   Ct Abdomen Pelvis W Contrast  Result Date: 01/26/2018 CLINICAL DATA:  57 year old male for restaging and subsequent treatment strategy for squamous cell carcinoma lung. EXAM: CT CHEST, ABDOMEN, AND PELVIS WITH CONTRAST TECHNIQUE: Multidetector CT imaging of the chest, abdomen and pelvis was performed following the standard protocol during bolus administration of intravenous contrast. CONTRAST:  114mL OMNIPAQUE IOHEXOL 300 MG/ML  SOLN COMPARISON:  09/08/2017 and 05/27/2017 PET CTs.  04/28/2017 chest CT FINDINGS: CT CHEST FINDINGS Cardiovascular: Normal heart size noted. Coronary artery calcifications are present. Mild aortic atherosclerotic calcifications noted without aneurysm. No pericardial effusion. Mediastinum/Nodes: No enlarged lymph nodes. Index low RIGHT paratracheal node now measures 6 mm (series 2: Image 24), previously 11 mm. Index 9 mm subcarinal node (2:30) is unchanged. No new or enlarging lymph nodes  identified. Mild soft tissue fullness in the RIGHT hilar region is unchanged. Lungs/Pleura: Streaky soft tissue opacity within the RIGHT UPPER lobe has not significantly changed. No new suspicious opacities identified. The LEFT lung is clear. No pleural effusion or pneumothorax. Musculoskeletal: No acute or suspicious bony abnormalities noted. Remote RIGHT rib fractures are present. CT ABDOMEN PELVIS FINDINGS Hepatobiliary: Mild hepatic steatosis again noted. No focal hepatic abnormalities noted. The gallbladder is unremarkable. No biliary dilatation. Pancreas: Unremarkable Spleen: Unremarkable Adrenals/Urinary Tract: Unchanged 2.4 cm LEFT adrenal adenoma. Fullness of the RIGHT adrenal gland again noted. Kidneys and bladder are unremarkable except for a stable punctate nonobstructing LEFT renal calculus and LEFT renal cyst. Stomach/Bowel: Stomach is within normal limits. Appendix appears normal. No evidence of bowel wall thickening, distention, or inflammatory changes. Vascular/Lymphatic: Aortic atherosclerosis. No enlarged abdominal or pelvic lymph nodes. Reproductive: Mild prostate enlargement again noted. Other: No ascites, focal collection or pneumoperitoneum. Musculoskeletal: No acute or suspicious bony abnormalities. IMPRESSION: 1. Little significant change since 09/08/2017 PET CT. No new or progressive malignancy or metastatic disease. Unchanged RIGHT UPPER lobe streaky opacities. 2. Hepatic steatosis 3. Punctate nonobstructing LEFT renal calculus 4. Coronary artery and aortic Atherosclerosis (ICD10-I70.0). Electronically Signed  By: Margarette Canada M.D.   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  PLAN:    1. 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  Patient was also discussed at cancer conference.  MRI of the brain is negative.  CT scan results from January 26, 2018 reviewed independently and  report as above with no new or progressive malignancy noted.  Proceed with cycle 11 of maintenance durvalumab today.  Patient initiated his maintenance treatment on September 11, 2017.  Will continue treatment for a total of 12 months.  Patient continues to decline port placement.  Return to clinic in 2 weeks for further evaluation and consideration of cycle 12. 2.  Hyperglycemia: Chronic and unchanged.  Patient is no longer receiving dexamethasone as a premedication.  3.  Indigestion: Continue omeprazole as prescribed. 4.  Pain: Patient does not complain of this today.  Continue tramadol as needed. 5.  Cough/congestion: Chest x-ray results reviewed independently and reported as above with suspicion for superimposed pneumonia on chronic changes.  Patient was given a prescription for Levaquin 500 mg x 7 days.  Return to clinic as above. 6.  Hypertension: Patient's blood pressure remains persistently elevated.  Continue current treatment as prescribed.   Patient expressed understanding and was in agreement with this plan. He also understands that He can call clinic at any time with any questions, concerns, or complaints.   Cancer Staging Squamous cell lung cancer, left [facility]) Staging form: Lung, AJCC 8th Edition - Clinical stage from 05/16/2017: Stage IIIB (cT3, cN2, cM0) - Signed by Lloyd Huger, MD on 05/16/2017   Lloyd Huger, MD   02/02/2018 12:13 PM

## 2018-01-26 ENCOUNTER — Other Ambulatory Visit: Payer: Self-pay | Admitting: Oncology

## 2018-01-26 ENCOUNTER — Other Ambulatory Visit: Payer: Self-pay | Admitting: Internal Medicine

## 2018-01-26 ENCOUNTER — Ambulatory Visit
Admission: RE | Admit: 2018-01-26 | Discharge: 2018-01-26 | Disposition: A | Discharge status: Home / Self Care | Payer: BLUE CROSS/BLUE SHIELD | Source: Ambulatory Visit | Attending: Oncology | Admitting: Oncology

## 2018-01-26 DIAGNOSIS — C3490 Malignant neoplasm of unspecified part of unspecified bronchus or lung: Secondary | ICD-10-CM | POA: Insufficient documentation

## 2018-01-26 DIAGNOSIS — R918 Other nonspecific abnormal finding of lung field: Secondary | ICD-10-CM | POA: Diagnosis not present

## 2018-01-26 DIAGNOSIS — K760 Fatty (change of) liver, not elsewhere classified: Secondary | ICD-10-CM | POA: Insufficient documentation

## 2018-01-26 DIAGNOSIS — I700 Atherosclerosis of aorta: Secondary | ICD-10-CM | POA: Insufficient documentation

## 2018-01-26 DIAGNOSIS — I2510 Atherosclerotic heart disease of native coronary artery without angina pectoris: Secondary | ICD-10-CM | POA: Diagnosis not present

## 2018-01-26 DIAGNOSIS — N200 Calculus of kidney: Secondary | ICD-10-CM | POA: Insufficient documentation

## 2018-01-26 HISTORY — DX: Malignant neoplasm of unspecified part of unspecified bronchus or lung: C34.90

## 2018-01-26 MED ORDER — IOHEXOL 300 MG/ML  SOLN
100.0000 mL | Freq: Once | INTRAMUSCULAR | Status: AC | PRN
  Administered 2018-01-26: 100 mL via INTRAVENOUS

## 2018-01-29 ENCOUNTER — Encounter: Payer: Self-pay | Admitting: Oncology

## 2018-01-29 ENCOUNTER — Ambulatory Visit
Admission: RE | Admit: 2018-01-29 | Discharge: 2018-01-29 | Disposition: A | Discharge status: Home / Self Care | Payer: BLUE CROSS/BLUE SHIELD | Source: Ambulatory Visit | Attending: Oncology | Admitting: Oncology

## 2018-01-29 ENCOUNTER — Inpatient Hospital Stay: Payer: BLUE CROSS/BLUE SHIELD

## 2018-01-29 ENCOUNTER — Inpatient Hospital Stay (HOSPITAL_BASED_OUTPATIENT_CLINIC_OR_DEPARTMENT_OTHER): Payer: BLUE CROSS/BLUE SHIELD | Admitting: Oncology

## 2018-01-29 VITALS — BP 145/93 | HR 101 | Temp 98.2°F | Resp 18 | Wt 222.6 lb

## 2018-01-29 VITALS — BP 150/89 | HR 100

## 2018-01-29 DIAGNOSIS — R531 Weakness: Secondary | ICD-10-CM

## 2018-01-29 DIAGNOSIS — C3492 Malignant neoplasm of unspecified part of left bronchus or lung: Secondary | ICD-10-CM | POA: Diagnosis present

## 2018-01-29 DIAGNOSIS — Z87891 Personal history of nicotine dependence: Secondary | ICD-10-CM

## 2018-01-29 DIAGNOSIS — E1165 Type 2 diabetes mellitus with hyperglycemia: Secondary | ICD-10-CM | POA: Diagnosis not present

## 2018-01-29 DIAGNOSIS — K30 Functional dyspepsia: Secondary | ICD-10-CM | POA: Diagnosis not present

## 2018-01-29 DIAGNOSIS — J449 Chronic obstructive pulmonary disease, unspecified: Secondary | ICD-10-CM

## 2018-01-29 DIAGNOSIS — R5383 Other fatigue: Secondary | ICD-10-CM

## 2018-01-29 DIAGNOSIS — I10 Essential (primary) hypertension: Secondary | ICD-10-CM

## 2018-01-29 DIAGNOSIS — M549 Dorsalgia, unspecified: Secondary | ICD-10-CM

## 2018-01-29 DIAGNOSIS — Z5112 Encounter for antineoplastic immunotherapy: Secondary | ICD-10-CM

## 2018-01-29 LAB — CBC WITH DIFFERENTIAL/PLATELET
BASOS PCT: 1 %
Basophils Absolute: 0.1 10*3/uL (ref 0–0.1)
EOS ABS: 0.3 10*3/uL (ref 0–0.7)
EOS PCT: 2 %
HCT: 41.1 % (ref 40.0–52.0)
HEMOGLOBIN: 14.1 g/dL (ref 13.0–18.0)
Lymphocytes Relative: 6 %
Lymphs Abs: 0.8 10*3/uL — ABNORMAL LOW (ref 1.0–3.6)
MCH: 30.4 pg (ref 26.0–34.0)
MCHC: 34.3 g/dL (ref 32.0–36.0)
MCV: 88.7 fL (ref 80.0–100.0)
MONO ABS: 1 10*3/uL (ref 0.2–1.0)
MONOS PCT: 8 %
NEUTROS PCT: 83 %
Neutro Abs: 11.7 10*3/uL — ABNORMAL HIGH (ref 1.4–6.5)
PLATELETS: 295 10*3/uL (ref 150–440)
RBC: 4.63 MIL/uL (ref 4.40–5.90)
RDW: 13.2 % (ref 11.5–14.5)
WBC: 13.9 10*3/uL — ABNORMAL HIGH (ref 3.8–10.6)

## 2018-01-29 LAB — COMPREHENSIVE METABOLIC PANEL
ALBUMIN: 3.8 g/dL (ref 3.5–5.0)
ALT: 20 U/L (ref 0–44)
ANION GAP: 13 (ref 5–15)
AST: 18 U/L (ref 15–41)
Alkaline Phosphatase: 94 U/L (ref 38–126)
BUN: 21 mg/dL — ABNORMAL HIGH (ref 6–20)
CHLORIDE: 95 mmol/L — AB (ref 98–111)
CO2: 25 mmol/L (ref 22–32)
Calcium: 9 mg/dL (ref 8.9–10.3)
Creatinine, Ser: 0.79 mg/dL (ref 0.61–1.24)
GFR calc non Af Amer: >60 mL/min (ref >60)
GLUCOSE: 192 mg/dL — AB (ref 70–99)
Potassium: 3.9 mmol/L (ref 3.5–5.1)
SODIUM: 133 mmol/L — AB (ref 135–145)
Total Bilirubin: 0.5 mg/dL (ref 0.3–1.2)
Total Protein: 7 g/dL (ref 6.5–8.1)

## 2018-01-29 MED ORDER — SODIUM CHLORIDE 0.9 % IV SOLN
Freq: Once | INTRAVENOUS | Status: AC
  Administered 2018-01-29: 10:00:00 via INTRAVENOUS
  Filled 2018-01-29: qty 1000

## 2018-01-29 MED ORDER — SODIUM CHLORIDE 0.9 % IV SOLN
1000.0000 mg | Freq: Once | INTRAVENOUS | Status: AC
  Administered 2018-01-29: 1000 mg via INTRAVENOUS
  Filled 2018-01-29: qty 20

## 2018-01-29 MED ORDER — SODIUM CHLORIDE 0.9 % IV SOLN
10.0000 mg/kg | Freq: Once | INTRAVENOUS | Status: DC
  Filled 2018-01-29: qty 19.2

## 2018-01-29 MED ORDER — LEVOFLOXACIN 500 MG PO TABS: 500.0000 mg | ORAL_TABLET | Freq: Every day | ORAL | 0 refills | Status: DC

## 2018-01-29 NOTE — Progress Notes (Signed)
Patient here today for follow up regarding lung cancer. Patient reports not feeling well the past few days, worsening productive cough and shortness of breath. Patient feels like he has been running a fever at home.

## 2018-01-30 ENCOUNTER — Telehealth: Payer: Self-pay | Admitting: *Deleted

## 2018-01-30 LAB — THYROID PANEL WITH TSH
FREE THYROXINE INDEX: 2.5 (ref 1.2–4.9)
T3 UPTAKE RATIO: 27 % (ref 24–39)
T4, Total: 9.4 ug/dL (ref 4.5–12.0)
TSH: 0.704 u[IU]/mL (ref 0.450–4.500)

## 2018-01-30 NOTE — Telephone Encounter (Signed)
Patient notified of chest xray results from yesterday, revealed possibility of pneumonia. Patient was started on Levaquin yesterday, Dr. Grayland Ormond recommends continuing Levaquin and complete 7 day course. Patient reports he is feeling better today, encouraged patient to use albuterol inhaler along with Levaquin. Patient also encouraged to call the on call provider over the weekend or the office on Monday if he feels worse or anything changes. Patient verbalized understanding.

## 2018-02-04 NOTE — Progress Notes (Signed)
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 - Continue Anoro, qvar, albuterol.  --Continue albuterol.   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  Nicotine abuse. - No longer smoking, last smoked about 2 weeks ago.    Return in about 6 months (around 08/08/2018).   Date: 02/04/2018  MRN# [id number] Gabriel Mendez September 24, 1960   Gabriel Mendez is a 57 y.o. old male seen in follow up for chief complaint of  Chief Complaint  Patient presents with  . COPD    pt has sob with exertion, cough with clear to yellow mucus. Pt states wheezing is improving.     HPI:   The patient is a 57 yo male diagnosed on 05/12/17 with Stage IIIB squamous cell carcinoma of left lung. He has just recently completed chemo/rads, he feels tired. He feels that his breathing is short with mild to moderate activity. 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  Last visit we discussed continuing Qvar, albuterol, and smoking cessation.  He is not smoking, since his last visit he had an episode of pneumonia which was treated with levaquin and he is not feeling better.  He otherwise feels that his breathing has been doing ok, he has occasionally has dyspnea on exertion, but he can drive his 18 wheeler and load and do most of it with some dyspnea.  He remains on Anoro once daily, and qvar 1 puff twice daily. He is taking albuterol MDI on most days and it also helps.   CT chest 01/26/18>> There is streaky atelectasis/scar where the previous right hilar/RUL mass was, which is now significantly better. Imaging personally reviewed.  PET scan 05/27/17, right upper lobe mass, mediastinal lymphadenopathy, mild emphysematous changes in the apices.   Medication:    Current Outpatient Medications:  .  albuterol (PROVENTIL HFA;VENTOLIN HFA) 108 (90 BASE) MCG/ACT  inhaler, Inhale 2 puffs into the lungs every 6 (six) hours as needed for wheezing or shortness of breath., Disp: , Rfl:  .  albuterol (PROVENTIL) (2.5 MG/3ML) 0.083% nebulizer solution, Take 2.5 mg every 6 (six) hours as needed by nebulization for wheezing or shortness of breath., Disp: , Rfl:  .  ANORO ELLIPTA 62.5-25 MCG/INH AEPB, INHALE 1 PUFF INTO THE LUNGS DAILY IN ADDITION TO QVAR, Disp: 60 each, Rfl: 2 .  beclomethasone (QVAR) 80 MCG/ACT inhaler, Inhale 1 puff into the lungs 2 (two) times daily., Disp: , Rfl:  .  glipiZIDE (GLUCOTROL XL) 10 MG 24 hr tablet, Take 10 mg by mouth daily with breakfast., Disp: , Rfl:  .  ibuprofen (ADVIL,MOTRIN) 200 MG tablet, Take 400-800 mg every 8 (eight) hours as needed by mouth (for pain.)., Disp: , Rfl:  .  levofloxacin (LEVAQUIN) 500 MG tablet, Take 1 tablet (500 mg total) by mouth daily., Disp: 7 tablet, Rfl: 0 .  lisinopril-hydrochlorothiazide (PRINZIDE,ZESTORETIC) 20-25 MG tablet, Take 1 tablet by mouth daily., Disp: , Rfl:  .  metFORMIN (GLUCOPHAGE) 1000 MG tablet, Take 1,000 mg by mouth 2 (two) times daily., Disp: , Rfl:  .  omeprazole (PRILOSEC) 20 MG capsule, TAKE 1 CAPSULE(20 MG) BY MOUTH DAILY, Disp: 30 capsule, Rfl: 0   Allergies:  Shrimp [shellfish allergy]  Review of Systems:  Constitutional: Feels well. Cardiovascular: No chest pain.  Pulmonary: Denies hemoptysis.   The remainder of systems were  reviewed and were found to be negative other than what is documented in the HPI.   Physical Examination:   VS: BP 138/88 (BP Location: Left Arm, Cuff Size: Large)   Pulse (!) 101   Resp 16   Ht 5\' 8"  (1.727 m)   Wt 221 lb (100.2 kg)   SpO2 94%   BMI 33.60 kg/m   General Appearance: No distress  Neuro:without focal findings, mental status, speech normal, alert and oriented HEENT: PERRLA, EOM intact Pulmonary: No wheezing, No rales  CardiovascularNormal S1,S2.  No m/r/g.  Abdomen: Benign, Soft, non-tender, No masses Renal:  No  costovertebral tenderness  GU:  No performed at this time. Endoc: No evident thyromegaly, no signs of acromegaly or Cushing features Skin:   warm, no rashes, no ecchymosis  Extremities: normal, no cyanosis, clubbing.     LABORATORY PANEL:   CBC Recent Labs  Lab 01/29/18 0903  WBC 13.9*  HGB 14.1  HCT 41.1  PLT 295   ------------------------------------------------------------------------------------------------------------------  Chemistries  Recent Labs  Lab 01/29/18 0903  NA 133*  K 3.9  CL 95*  CO2 25  GLUCOSE 192*  BUN 21*  CREATININE 0.79  CALCIUM 9.0  AST 18  ALT 20  ALKPHOS 94  BILITOT 0.5   ------------------------------------------------------------------------------------------------------------------  Cardiac Enzymes No results for input(s): TROPONINI in the last 168 hours. ------------------------------------------------------------  RADIOLOGY:   No results found for this or any previous visit. No results found for this or any previous visit. ------------------------------------------------------------------------------------------------------------------  Thank  you for allowing [facility] to assist in the care of your patient. Our recommendations are noted above.  Please contact us if we can be of further service.  Marda Stalker, M.D., F.C.C.P.  Board Certified in Internal Medicine, Pulmonary Medicine, [place], and Sleep Medicine.  [facility] Office Number: [phone]   02/04/2018

## 2018-02-05 ENCOUNTER — Encounter: Payer: Self-pay | Admitting: Internal Medicine

## 2018-02-05 ENCOUNTER — Ambulatory Visit (INDEPENDENT_AMBULATORY_CARE_PROVIDER_SITE_OTHER): Payer: BLUE CROSS/BLUE SHIELD | Admitting: Internal Medicine

## 2018-02-05 VITALS — BP 138/88 | HR 101 | Resp 16 | Ht 68.0 in | Wt 221.0 lb

## 2018-02-05 DIAGNOSIS — J42 Unspecified chronic bronchitis: Secondary | ICD-10-CM

## 2018-02-05 DIAGNOSIS — R918 Other nonspecific abnormal finding of lung field: Secondary | ICD-10-CM

## 2018-02-05 NOTE — Patient Instructions (Addendum)
Continue Anoro, qvar.

## 2018-02-08 NOTE — Progress Notes (Signed)
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  INTERVAL HISTORY: Patient returns to clinic today for further evaluation and consideration of cycle 12 of maintenance Durvalumab.  His cough, congestion, and low-grade fevers have resolved and he feels back to his baseline. He has no neurologic complaints. He has a good appetite and denies weight loss.  He denies any chest pain or shortness of breath.  He has no nausea, vomiting, constipation, or diarrhea.  He has no urinary complaints.  Patient offers no specific complaints today.  REVIEW OF SYSTEMS:   Review of Systems  Constitutional: Negative.  Negative for fever and weight loss.  HENT: Negative.  Negative for congestion.   Respiratory: Negative.  Negative for cough, hemoptysis and shortness of breath.   Cardiovascular: Negative.  Negative for chest pain and leg swelling.  Gastrointestinal: Negative.  Negative for abdominal pain and heartburn.  Genitourinary: Negative.  Negative for dysuria.  Musculoskeletal: Negative.  Negative for joint pain.  Skin: Negative.  Negative for rash.  Neurological: Negative.  Negative for sensory change, focal weakness and weakness.  Psychiatric/Behavioral: Negative.  The patient is not nervous/anxious.     As per HPI. Otherwise, a complete review of systems is negative.  PAST MEDICAL HISTORY: Past Medical History:  Diagnosis Date  . COPD (chronic obstructive pulmonary disease) ([place])   . Diabetes mellitus without complication ([place])   . Hypertension   . Lung cancer ([place]) 05/2017   Hx Chemo + rad tx's.  . Pneumonia     PAST SURGICAL HISTORY: Past Surgical History:  Procedure Laterality Date  . ENDOBRONCHIAL  ULTRASOUND N/A 05/12/2017   Procedure: ENDOBRONCHIAL ULTRASOUND;  Surgeon: Laverle Hobby, MD;  Location: [facility] ORS;  Service: Pulmonary;  Laterality: N/A;  . FRACTURE SURGERY Left    ANKLE X 2  . TONSILLECTOMY      FAMILY HISTORY: Family History  Problem Relation Age of Onset  . Stroke Mother   . Diabetes Mother   . Hypertension Mother   . Diabetes Father   . Hypertension Father   . Diabetes Sister   . Diabetes Brother   . Heart attack Paternal Uncle   . Stroke Maternal Grandmother     ADVANCED DIRECTIVES (Y/N):  N  HEALTH MAINTENANCE: Social History   Tobacco Use  . Smoking status: Former Smoker    Packs/day: 1.50    Last attempt to quit: 07/31/2017    Years since quitting: 0.5  . Smokeless tobacco: Never Used  Substance Use Topics  . Alcohol use: No  . Drug use: No     Colonoscopy:  PAP:  Bone density:  Lipid panel:  Allergies  Allergen Reactions  . Shrimp [Shellfish Allergy] Nausea And Vomiting    Current Outpatient Medications  Medication Sig Dispense Refill  . albuterol (PROVENTIL HFA;VENTOLIN HFA) 108 (90 BASE) MCG/ACT inhaler Inhale 2 puffs into the lungs every 6 (six) hours as needed for wheezing or shortness of breath.    Marland Kitchen albuterol (PROVENTIL) (2.5 MG/3ML) 0.083% nebulizer solution Take 2.5 mg every 6 (six) hours as needed by nebulization for wheezing or shortness of breath.    Jearl Klinefelter ELLIPTA 62.5-25 MCG/INH AEPB INHALE 1 PUFF INTO THE LUNGS DAILY IN ADDITION TO QVAR 60 each 2  .  beclomethasone (QVAR) 80 MCG/ACT inhaler Inhale 1 puff into the lungs 2 (two) times daily.    Marland Kitchen glipiZIDE (GLUCOTROL XL) 10 MG 24 hr tablet Take 10 mg by mouth daily with breakfast.    . ibuprofen (ADVIL,MOTRIN) 200 MG tablet Take 400-800 mg every 8 (eight) hours as needed by mouth (for pain.).    Marland Kitchen lisinopril-hydrochlorothiazide (PRINZIDE,ZESTORETIC) 20-25 MG tablet Take 1 tablet by mouth daily.    . metFORMIN (GLUCOPHAGE) 1000 MG tablet Take 1,000 mg by mouth 2  (two) times daily.    Marland Kitchen omeprazole (PRILOSEC) 20 MG capsule TAKE 1 CAPSULE(20 MG) BY MOUTH DAILY 30 capsule 0   No current facility-administered medications for this visit.     OBJECTIVE: Vitals:   02/12/18 0859 02/12/18 0902  BP:  (!) 146/92  Pulse:  87  Resp: 20   Temp:  98 F (36.7 C)     Body mass index is 33.91 kg/m.    ECOG FS:0 - Asymptomatic  General: Well-developed, well-nourished, no acute distress. Eyes: Pink conjunctiva, anicteric sclera. HEENT: Normocephalic, moist mucous membranes. Lungs: Clear to auscultation bilaterally. Heart: Regular rate and rhythm. No rubs, murmurs, or gallops. Abdomen: Soft, nontender, nondistended. No organomegaly noted, normoactive bowel sounds. Musculoskeletal: No edema, cyanosis, or clubbing. Neuro: Alert, answering all questions appropriately. Cranial nerves grossly intact. Skin: No rashes or petechiae noted. Psych: Normal affect.  LAB RESULTS:  Lab Results  Component Value Date   NA 134 (L) 02/12/2018   K 4.2 02/12/2018   CL 98 02/12/2018   CO2 27 02/12/2018   GLUCOSE 197 (H) 02/12/2018   BUN 15 02/12/2018   CREATININE 0.79 02/12/2018   CALCIUM 9.3 02/12/2018   PROT 6.7 02/12/2018   ALBUMIN 3.8 02/12/2018   AST 15 02/12/2018   ALT 14 02/12/2018   ALKPHOS 94 02/12/2018   BILITOT 0.5 02/12/2018   GFRNONAA >60 02/12/2018   GFRAA >60 02/12/2018    Lab Results  Component Value Date   WBC 9.1 02/12/2018   NEUTROABS 6.9 (H) 02/12/2018   HGB 14.7 02/12/2018   HCT 43.1 02/12/2018   MCV 88.5 02/12/2018   PLT 347 02/12/2018     STUDIES: Dg Chest 2 View  Result Date: 01/29/2018 CLINICAL DATA:  Cough and fever with shortness of breath for 1 week EXAM: CHEST - 2 VIEW COMPARISON:  01/26/2018 chest CT FINDINGS: There is post treatment scarring and architectural distortion in the right upper lobe with thickening of the right hilum. This was recently evaluated by CT. This opacity appears increased in both the frontal  projections when compared to scanogram at prior CT. Normal heart size and aortic contours. IMPRESSION: Chronic post treatment changes in the right upper lobe. Density appears increased from recent chest CT, suspicious for superimposed pneumonia. Electronically Signed   By: Monte Fantasia M.D.   On: 01/29/2018 14:35   Ct Chest W Contrast  Result Date: 01/26/2018 CLINICAL DATA:  57 year old male for restaging and subsequent treatment strategy for squamous cell carcinoma lung. EXAM: CT CHEST, ABDOMEN, AND PELVIS WITH CONTRAST TECHNIQUE: Multidetector CT imaging of the chest, abdomen and pelvis was performed following the standard protocol during bolus administration of intravenous contrast. CONTRAST:  1106mL OMNIPAQUE IOHEXOL 300 MG/ML  SOLN COMPARISON:  09/08/2017 and 05/27/2017 PET CTs.  04/28/2017 chest CT FINDINGS: CT CHEST FINDINGS Cardiovascular: Normal heart size noted. Coronary artery calcifications are present. Mild aortic atherosclerotic calcifications noted without aneurysm. No pericardial effusion. Mediastinum/Nodes: No enlarged lymph nodes. Index low RIGHT paratracheal node now measures 6  mm (series 2: Image 24), previously 11 mm. Index 9 mm subcarinal node (2:30) is unchanged. No new or enlarging lymph nodes identified. Mild soft tissue fullness in the RIGHT hilar region is unchanged. Lungs/Pleura: Streaky soft tissue opacity within the RIGHT UPPER lobe has not significantly changed. No new suspicious opacities identified. The LEFT lung is clear. No pleural effusion or pneumothorax. Musculoskeletal: No acute or suspicious bony abnormalities noted. Remote RIGHT rib fractures are present. CT ABDOMEN PELVIS FINDINGS Hepatobiliary: Mild hepatic steatosis again noted. No focal hepatic abnormalities noted. The gallbladder is unremarkable. No biliary dilatation. Pancreas: Unremarkable Spleen: Unremarkable Adrenals/Urinary Tract: Unchanged 2.4 cm LEFT adrenal adenoma. Fullness of the RIGHT adrenal gland again  noted. Kidneys and bladder are unremarkable except for a stable punctate nonobstructing LEFT renal calculus and LEFT renal cyst. Stomach/Bowel: Stomach is within normal limits. Appendix appears normal. No evidence of bowel wall thickening, distention, or inflammatory changes. Vascular/Lymphatic: Aortic atherosclerosis. No enlarged abdominal or pelvic lymph nodes. Reproductive: Mild prostate enlargement again noted. Other: No ascites, focal collection or pneumoperitoneum. Musculoskeletal: No acute or suspicious bony abnormalities. IMPRESSION: 1. Little significant change since 09/08/2017 PET CT. No new or progressive malignancy or metastatic disease. Unchanged RIGHT UPPER lobe streaky opacities. 2. Hepatic steatosis 3. Punctate nonobstructing LEFT renal calculus 4. Coronary artery and aortic Atherosclerosis (ICD10-I70.0). Electronically Signed   By: Margarette Canada M.D.   On: 01/26/2018 12:31   Ct Abdomen Pelvis W Contrast  Result Date: 01/26/2018 CLINICAL DATA:  57 year old male for restaging and subsequent treatment strategy for squamous cell carcinoma lung. EXAM: CT CHEST, ABDOMEN, AND PELVIS WITH CONTRAST TECHNIQUE: Multidetector CT imaging of the chest, abdomen and pelvis was performed following the standard protocol during bolus administration of intravenous contrast. CONTRAST:  153mL OMNIPAQUE IOHEXOL 300 MG/ML  SOLN COMPARISON:  09/08/2017 and 05/27/2017 PET CTs.  04/28/2017 chest CT FINDINGS: CT CHEST FINDINGS Cardiovascular: Normal heart size noted. Coronary artery calcifications are present. Mild aortic atherosclerotic calcifications noted without aneurysm. No pericardial effusion. Mediastinum/Nodes: No enlarged lymph nodes. Index low RIGHT paratracheal node now measures 6 mm (series 2: Image 24), previously 11 mm. Index 9 mm subcarinal node (2:30) is unchanged. No new or enlarging lymph nodes identified. Mild soft tissue fullness in the RIGHT hilar region is unchanged. Lungs/Pleura: Streaky soft tissue  opacity within the RIGHT UPPER lobe has not significantly changed. No new suspicious opacities identified. The LEFT lung is clear. No pleural effusion or pneumothorax. Musculoskeletal: No acute or suspicious bony abnormalities noted. Remote RIGHT rib fractures are present. CT ABDOMEN PELVIS FINDINGS Hepatobiliary: Mild hepatic steatosis again noted. No focal hepatic abnormalities noted. The gallbladder is unremarkable. No biliary dilatation. Pancreas: Unremarkable Spleen: Unremarkable Adrenals/Urinary Tract: Unchanged 2.4 cm LEFT adrenal adenoma. Fullness of the RIGHT adrenal gland again noted. Kidneys and bladder are unremarkable except for a stable punctate nonobstructing LEFT renal calculus and LEFT renal cyst. Stomach/Bowel: Stomach is within normal limits. Appendix appears normal. No evidence of bowel wall thickening, distention, or inflammatory changes. Vascular/Lymphatic: Aortic atherosclerosis. No enlarged abdominal or pelvic lymph nodes. Reproductive: Mild prostate enlargement again noted. Other: No ascites, focal collection or pneumoperitoneum. Musculoskeletal: No acute or suspicious bony abnormalities. IMPRESSION: 1. Little significant change since 09/08/2017 PET CT. No new or progressive malignancy or metastatic disease. Unchanged RIGHT UPPER lobe streaky opacities. 2. Hepatic steatosis 3. Punctate nonobstructing LEFT renal calculus 4. Coronary artery and aortic Atherosclerosis (ICD10-I70.0). Electronically Signed   By: Margarette Canada M.D.   On: 01/26/2018 12:31    ASSESSMENT: Stage IIIb  squamous cell carcinoma of the left lung.  PLAN:    1. 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  Patient was also discussed at cancer conference.  MRI of the brain is negative.  CT scan results from January 26, 2018 reviewed independently and reported as above with no new or progressive malignancy noted.  Proceed with cycle 12 of maintenance durvalumab  today.  Patient initiated his maintenance treatment on September 11, 2017.  Will continue treatment for a total of 12 months.  Patient continues to decline port placement.  Return to clinic in 2 weeks for consideration of cycle 13 and then in 4 weeks for further evaluation and consideration of cycle 14.   2.  Hyperglycemia: Chronic and unchanged.  Patient's blood glucose is 197 today.  Patient is no longer receiving dexamethasone as a premedication.  3.  Indigestion: Patient does not complain of this today.  Continue omeprazole as prescribed. 4.  Pain: Patient does not complain of this today.  Continue tramadol as needed. 5.  Cough/congestion: Resolved. 6.  Hypertension: Patient's blood pressure remains persistently elevated.  Continue current treatment as prescribed. 7.  Hyponatremia: Mild, monitor.   Patient expressed understanding and was in agreement with this plan. He also understands that He can call clinic at any time with any questions, concerns, or complaints.   Cancer Staging Squamous cell lung cancer, left [facility]) Staging form: Lung, AJCC 8th Edition - Clinical stage from 05/16/2017: Stage IIIB (cT3, cN2, cM0) - Signed by Lloyd Huger, MD on 05/16/2017   Lloyd Huger, MD   02/16/2018 6:56 AM

## 2018-02-12 ENCOUNTER — Inpatient Hospital Stay (HOSPITAL_BASED_OUTPATIENT_CLINIC_OR_DEPARTMENT_OTHER): Payer: BLUE CROSS/BLUE SHIELD | Admitting: Oncology

## 2018-02-12 ENCOUNTER — Other Ambulatory Visit: Payer: Self-pay

## 2018-02-12 ENCOUNTER — Encounter: Payer: Self-pay | Admitting: Oncology

## 2018-02-12 ENCOUNTER — Inpatient Hospital Stay: Payer: BLUE CROSS/BLUE SHIELD

## 2018-02-12 VITALS — BP 146/92 | HR 87 | Temp 98.0°F | Resp 20 | Ht 68.0 in | Wt 223.0 lb

## 2018-02-12 DIAGNOSIS — J449 Chronic obstructive pulmonary disease, unspecified: Secondary | ICD-10-CM

## 2018-02-12 DIAGNOSIS — I10 Essential (primary) hypertension: Secondary | ICD-10-CM

## 2018-02-12 DIAGNOSIS — K30 Functional dyspepsia: Secondary | ICD-10-CM | POA: Diagnosis not present

## 2018-02-12 DIAGNOSIS — R531 Weakness: Secondary | ICD-10-CM

## 2018-02-12 DIAGNOSIS — Z5111 Encounter for antineoplastic chemotherapy: Secondary | ICD-10-CM

## 2018-02-12 DIAGNOSIS — M549 Dorsalgia, unspecified: Secondary | ICD-10-CM

## 2018-02-12 DIAGNOSIS — C3492 Malignant neoplasm of unspecified part of left bronchus or lung: Secondary | ICD-10-CM

## 2018-02-12 DIAGNOSIS — Z79899 Other long term (current) drug therapy: Secondary | ICD-10-CM

## 2018-02-12 DIAGNOSIS — E1165 Type 2 diabetes mellitus with hyperglycemia: Secondary | ICD-10-CM | POA: Diagnosis not present

## 2018-02-12 DIAGNOSIS — R5383 Other fatigue: Secondary | ICD-10-CM

## 2018-02-12 LAB — COMPREHENSIVE METABOLIC PANEL
ALT: 14 U/L (ref 0–44)
AST: 15 U/L (ref 15–41)
Albumin: 3.8 g/dL (ref 3.5–5.0)
Alkaline Phosphatase: 94 U/L (ref 38–126)
Anion gap: 9 (ref 5–15)
BILIRUBIN TOTAL: 0.5 mg/dL (ref 0.3–1.2)
BUN: 15 mg/dL (ref 6–20)
CO2: 27 mmol/L (ref 22–32)
Calcium: 9.3 mg/dL (ref 8.9–10.3)
Chloride: 98 mmol/L (ref 98–111)
Creatinine, Ser: 0.79 mg/dL (ref 0.61–1.24)
Glucose, Bld: 197 mg/dL — ABNORMAL HIGH (ref 70–99)
Potassium: 4.2 mmol/L (ref 3.5–5.1)
Sodium: 134 mmol/L — ABNORMAL LOW (ref 135–145)
TOTAL PROTEIN: 6.7 g/dL (ref 6.5–8.1)

## 2018-02-12 LAB — CBC WITH DIFFERENTIAL/PLATELET
BASOS PCT: 1 %
Basophils Absolute: 0.1 10*3/uL (ref 0–0.1)
EOS PCT: 7 %
Eosinophils Absolute: 0.6 10*3/uL (ref 0–0.7)
HEMATOCRIT: 43.1 % (ref 40.0–52.0)
Hemoglobin: 14.7 g/dL (ref 13.0–18.0)
LYMPHS PCT: 9 %
Lymphs Abs: 0.8 10*3/uL — ABNORMAL LOW (ref 1.0–3.6)
MCH: 30.1 pg (ref 26.0–34.0)
MCHC: 34 g/dL (ref 32.0–36.0)
MCV: 88.5 fL (ref 80.0–100.0)
MONO ABS: 0.6 10*3/uL (ref 0.2–1.0)
Monocytes Relative: 7 %
NEUTROS ABS: 6.9 10*3/uL — AB (ref 1.4–6.5)
Neutrophils Relative %: 76 %
PLATELETS: 347 10*3/uL (ref 150–440)
RBC: 4.86 MIL/uL (ref 4.40–5.90)
RDW: 13 % (ref 11.5–14.5)
WBC: 9.1 10*3/uL (ref 3.8–10.6)

## 2018-02-12 MED ORDER — SODIUM CHLORIDE 0.9 % IV SOLN
1000.0000 mg | Freq: Once | INTRAVENOUS | Status: AC
  Administered 2018-02-12: 1000 mg via INTRAVENOUS
  Filled 2018-02-12: qty 20

## 2018-02-12 MED ORDER — SODIUM CHLORIDE 0.9 % IV SOLN
Freq: Once | INTRAVENOUS | Status: AC
  Administered 2018-02-12: 10:00:00 via INTRAVENOUS
  Filled 2018-02-12: qty 250

## 2018-02-12 NOTE — Progress Notes (Signed)
Patient here for pre treatment check no changes since last appt.

## 2018-02-25 ENCOUNTER — Other Ambulatory Visit: Payer: Self-pay | Admitting: Oncology

## 2018-02-26 ENCOUNTER — Inpatient Hospital Stay: Payer: BLUE CROSS/BLUE SHIELD | Attending: Oncology

## 2018-02-26 ENCOUNTER — Inpatient Hospital Stay: Payer: BLUE CROSS/BLUE SHIELD

## 2018-02-26 VITALS — BP 123/79 | HR 76 | Temp 96.0°F | Resp 20 | Wt 223.4 lb

## 2018-02-26 DIAGNOSIS — E1165 Type 2 diabetes mellitus with hyperglycemia: Secondary | ICD-10-CM | POA: Diagnosis not present

## 2018-02-26 DIAGNOSIS — Z79899 Other long term (current) drug therapy: Secondary | ICD-10-CM | POA: Diagnosis not present

## 2018-02-26 DIAGNOSIS — Z923 Personal history of irradiation: Secondary | ICD-10-CM | POA: Insufficient documentation

## 2018-02-26 DIAGNOSIS — K30 Functional dyspepsia: Secondary | ICD-10-CM | POA: Diagnosis not present

## 2018-02-26 DIAGNOSIS — J449 Chronic obstructive pulmonary disease, unspecified: Secondary | ICD-10-CM | POA: Insufficient documentation

## 2018-02-26 DIAGNOSIS — Z5112 Encounter for antineoplastic immunotherapy: Secondary | ICD-10-CM | POA: Insufficient documentation

## 2018-02-26 DIAGNOSIS — E119 Type 2 diabetes mellitus without complications: Secondary | ICD-10-CM | POA: Diagnosis not present

## 2018-02-26 DIAGNOSIS — Z7984 Long term (current) use of oral hypoglycemic drugs: Secondary | ICD-10-CM | POA: Diagnosis not present

## 2018-02-26 DIAGNOSIS — Z9221 Personal history of antineoplastic chemotherapy: Secondary | ICD-10-CM | POA: Insufficient documentation

## 2018-02-26 DIAGNOSIS — Z87891 Personal history of nicotine dependence: Secondary | ICD-10-CM | POA: Diagnosis not present

## 2018-02-26 DIAGNOSIS — I10 Essential (primary) hypertension: Secondary | ICD-10-CM | POA: Insufficient documentation

## 2018-02-26 DIAGNOSIS — C3492 Malignant neoplasm of unspecified part of left bronchus or lung: Secondary | ICD-10-CM

## 2018-02-26 DIAGNOSIS — E871 Hypo-osmolality and hyponatremia: Secondary | ICD-10-CM | POA: Insufficient documentation

## 2018-02-26 LAB — COMPREHENSIVE METABOLIC PANEL
ALT: 18 U/L (ref 0–44)
AST: 19 U/L (ref 15–41)
Albumin: 4.1 g/dL (ref 3.5–5.0)
Alkaline Phosphatase: 99 U/L (ref 38–126)
Anion gap: 9 (ref 5–15)
BUN: 20 mg/dL (ref 6–20)
CHLORIDE: 101 mmol/L (ref 98–111)
CO2: 26 mmol/L (ref 22–32)
CREATININE: 0.87 mg/dL (ref 0.61–1.24)
Calcium: 9.3 mg/dL (ref 8.9–10.3)
GFR calc Af Amer: >60 mL/min (ref >60)
GLUCOSE: 178 mg/dL — AB (ref 70–99)
POTASSIUM: 3.9 mmol/L (ref 3.5–5.1)
Sodium: 136 mmol/L (ref 135–145)
Total Bilirubin: 0.4 mg/dL (ref 0.3–1.2)
Total Protein: 7 g/dL (ref 6.5–8.1)

## 2018-02-26 LAB — CBC WITH DIFFERENTIAL/PLATELET
Basophils Absolute: 0.1 10*3/uL (ref 0–0.1)
Basophils Relative: 1 %
EOS ABS: 0.8 10*3/uL — AB (ref 0–0.7)
EOS PCT: 8 %
HCT: 43.1 % (ref 40.0–52.0)
Hemoglobin: 14.6 g/dL (ref 13.0–18.0)
LYMPHS ABS: 1 10*3/uL (ref 1.0–3.6)
LYMPHS PCT: 10 %
MCH: 30.1 pg (ref 26.0–34.0)
MCHC: 33.8 g/dL (ref 32.0–36.0)
MCV: 89 fL (ref 80.0–100.0)
MONOS PCT: 9 %
Monocytes Absolute: 0.8 10*3/uL (ref 0.2–1.0)
Neutro Abs: 7 10*3/uL — ABNORMAL HIGH (ref 1.4–6.5)
Neutrophils Relative %: 72 %
PLATELETS: 270 10*3/uL (ref 150–440)
RBC: 4.84 MIL/uL (ref 4.40–5.90)
RDW: 13.8 % (ref 11.5–14.5)
WBC: 9.6 10*3/uL (ref 3.8–10.6)

## 2018-02-26 MED ORDER — SODIUM CHLORIDE 0.9 % IV SOLN
1000.0000 mg | Freq: Once | INTRAVENOUS | Status: AC
  Administered 2018-02-26: 1000 mg via INTRAVENOUS
  Filled 2018-02-26: qty 20

## 2018-02-26 MED ORDER — SODIUM CHLORIDE 0.9 % IV SOLN
Freq: Once | INTRAVENOUS | Status: AC
  Administered 2018-02-26: 09:00:00 via INTRAVENOUS
  Filled 2018-02-26: qty 250

## 2018-03-08 NOTE — Progress Notes (Signed)
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  INTERVAL HISTORY: Patient returns to clinic today for further evaluation and consideration of cycle 14 of maintenance durvalumab.  He currently feels well and is asymptomatic. He has no neurologic complaints. He has a good appetite and denies weight loss.  He denies any chest pain, cough, hemoptysis, or shortness of breath.  He has no nausea, vomiting, constipation, or diarrhea.  He has no urinary complaints.  Patient feels at his baseline offers no specific complaints today.  REVIEW OF SYSTEMS:   Review of Systems  Constitutional: Negative.  Negative for fever and weight loss.  HENT: Negative.  Negative for congestion.   Respiratory: Negative.  Negative for cough, hemoptysis and shortness of breath.   Cardiovascular: Negative.  Negative for chest pain and leg swelling.  Gastrointestinal: Negative.  Negative for abdominal pain and heartburn.  Genitourinary: Negative.  Negative for dysuria.  Musculoskeletal: Negative.  Negative for joint pain.  Skin: Negative.  Negative for rash.  Neurological: Negative.  Negative for sensory change, focal weakness and weakness.  Psychiatric/Behavioral: Negative.  The patient is not nervous/anxious.     As per HPI. Otherwise, a complete review of systems is negative.  PAST MEDICAL HISTORY: Past Medical History:  Diagnosis Date  . COPD (chronic obstructive pulmonary disease) ([place])   . Diabetes mellitus without complication ([place])   . Hypertension   . Lung cancer ([place]) 05/2017   Hx Chemo + rad tx's.  . Pneumonia     PAST SURGICAL HISTORY: Past Surgical History:  Procedure Laterality Date  . ENDOBRONCHIAL  ULTRASOUND N/A 05/12/2017   Procedure: ENDOBRONCHIAL ULTRASOUND;  Surgeon: Laverle Hobby, MD;  Location: [facility] ORS;  Service: Pulmonary;  Laterality: N/A;  . FRACTURE SURGERY Left    ANKLE X 2  . TONSILLECTOMY      FAMILY HISTORY: Family History  Problem Relation Age of Onset  . Stroke Mother   . Diabetes Mother   . Hypertension Mother   . Diabetes Father   . Hypertension Father   . Diabetes Sister   . Diabetes Brother   . Heart attack Paternal Uncle   . Stroke Maternal Grandmother     ADVANCED DIRECTIVES (Y/N):  N  HEALTH MAINTENANCE: Social History   Tobacco Use  . Smoking status: Former Smoker    Packs/day: 1.50    Last attempt to quit: 07/31/2017    Years since quitting: 0.6  . Smokeless tobacco: Never Used  Substance Use Topics  . Alcohol use: No  . Drug use: No     Colonoscopy:  PAP:  Bone density:  Lipid panel:  Allergies  Allergen Reactions  . Shrimp [Shellfish Allergy] Nausea And Vomiting    Current Outpatient Medications  Medication Sig Dispense Refill  . albuterol (PROVENTIL HFA;VENTOLIN HFA) 108 (90 BASE) MCG/ACT inhaler Inhale 2 puffs into the lungs every 6 (six) hours as needed for wheezing or shortness of breath.    Marland Kitchen albuterol (PROVENTIL) (2.5 MG/3ML) 0.083% nebulizer solution Take 2.5 mg every 6 (six) hours as needed by nebulization for wheezing or shortness of breath.    Jearl Klinefelter ELLIPTA 62.5-25 MCG/INH AEPB INHALE 1 PUFF INTO THE LUNGS DAILY IN ADDITION TO QVAR 60 each 2  . beclomethasone (QVAR)  80 MCG/ACT inhaler Inhale 1 puff into the lungs 2 (two) times daily.    Marland Kitchen glipiZIDE (GLUCOTROL XL) 10 MG 24 hr tablet Take 10 mg by mouth daily with breakfast.    . lisinopril-hydrochlorothiazide (PRINZIDE,ZESTORETIC) 20-25 MG tablet Take 1 tablet by mouth daily.    . metFORMIN (GLUCOPHAGE) 1000 MG tablet Take 1,000 mg by mouth 2 (two) times daily.    Marland Kitchen omeprazole (PRILOSEC) 20 MG capsule TAKE 1 CAPSULE(20 MG) BY MOUTH DAILY 30 capsule 0  .  ibuprofen (ADVIL,MOTRIN) 200 MG tablet Take 400-800 mg every 8 (eight) hours as needed by mouth (for pain.).     No current facility-administered medications for this visit.     OBJECTIVE: Vitals:   03/12/18 0907  BP: (!) 180/98  Pulse: 87  Resp: 18  Temp: (!) 96.5 F (35.8 C)     Body mass index is 34.42 kg/m.    ECOG FS:0 - Asymptomatic  General: Well-developed, well-nourished, no acute distress. Eyes: Pink conjunctiva, anicteric sclera. HEENT: Normocephalic, moist mucous membranes. Lungs: Clear to auscultation bilaterally. Heart: Regular rate and rhythm. No rubs, murmurs, or gallops. Abdomen: Soft, nontender, nondistended. No organomegaly noted, normoactive bowel sounds. Musculoskeletal: No edema, cyanosis, or clubbing. Neuro: Alert, answering all questions appropriately. Cranial nerves grossly intact. Skin: No rashes or petechiae noted. Psych: Normal affect.  LAB RESULTS:  Lab Results  Component Value Date   NA 133 (L) 03/12/2018   K 4.1 03/12/2018   CL 97 (L) 03/12/2018   CO2 27 03/12/2018   GLUCOSE 170 (H) 03/12/2018   BUN 18 03/12/2018   CREATININE 0.85 03/12/2018   CALCIUM 9.3 03/12/2018   PROT 6.8 03/12/2018   ALBUMIN 3.8 03/12/2018   AST 17 03/12/2018   ALT 20 03/12/2018   ALKPHOS 82 03/12/2018   BILITOT 0.5 03/12/2018   GFRNONAA >60 03/12/2018   GFRAA >60 03/12/2018    Lab Results  Component Value Date   WBC 8.8 03/12/2018   NEUTROABS 6.3 03/12/2018   HGB 15.0 03/12/2018   HCT 43.4 03/12/2018   MCV 87.8 03/12/2018   PLT 318 03/12/2018     STUDIES: No results found.  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  PLAN:    1. 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  Patient was also discussed at cancer conference.  MRI of the brain is negative.  CT scan results from January 26, 2018 reviewed independently with no new or progressive malignancy noted.  Proceed  with cycle 14 of maintenance durvalumab today.  Patient initiated his maintenance treatment on September 11, 2017.  Will continue treatment for a total of 12 months.  Patient continues to decline port placement.  Return to clinic in 2 weeks for treatment only and then in 4 weeks for further evaluation and consideration of cycle 16.   2.  Hyperglycemia: Chronic and unchanged.  Patient glucose is 170 today.  Patient is no longer receiving dexamethasone as a premedication.  3.  Indigestion: Patient does not complain of this today.  Continue omeprazole as prescribed. 4.  Pain: Patient does not complain of this today.  Continue tramadol as needed. 5.  Cough/congestion: Resolved. 6.  Hypertension: Chronic.  Patient's blood pressure remains persistently elevated.  Continue current treatment as prescribed. 7.  Hyponatremia: Chronic and relatively unchanged.  Patient expressed understanding and was in agreement with this plan. He also understands that He can call clinic at any time with any questions,  concerns, or complaints.   Cancer Staging Squamous cell lung cancer, left [facility]) Staging form: Lung, AJCC 8th Edition - Clinical stage from 05/16/2017: Stage IIIB (cT3, cN2, cM0) - Signed by Lloyd Huger, MD on 05/16/2017   Lloyd Huger, MD   03/15/2018 9:03 AM

## 2018-03-12 ENCOUNTER — Inpatient Hospital Stay: Payer: BLUE CROSS/BLUE SHIELD

## 2018-03-12 ENCOUNTER — Other Ambulatory Visit: Payer: Self-pay

## 2018-03-12 ENCOUNTER — Inpatient Hospital Stay (HOSPITAL_BASED_OUTPATIENT_CLINIC_OR_DEPARTMENT_OTHER): Payer: BLUE CROSS/BLUE SHIELD | Admitting: Oncology

## 2018-03-12 VITALS — BP 180/98 | HR 87 | Temp 96.5°F | Resp 18 | Wt 226.4 lb

## 2018-03-12 DIAGNOSIS — E1165 Type 2 diabetes mellitus with hyperglycemia: Secondary | ICD-10-CM

## 2018-03-12 DIAGNOSIS — C3492 Malignant neoplasm of unspecified part of left bronchus or lung: Secondary | ICD-10-CM

## 2018-03-12 DIAGNOSIS — E119 Type 2 diabetes mellitus without complications: Secondary | ICD-10-CM

## 2018-03-12 DIAGNOSIS — K30 Functional dyspepsia: Secondary | ICD-10-CM

## 2018-03-12 DIAGNOSIS — Z5112 Encounter for antineoplastic immunotherapy: Secondary | ICD-10-CM | POA: Diagnosis not present

## 2018-03-12 DIAGNOSIS — Z7984 Long term (current) use of oral hypoglycemic drugs: Secondary | ICD-10-CM

## 2018-03-12 DIAGNOSIS — Z79899 Other long term (current) drug therapy: Secondary | ICD-10-CM

## 2018-03-12 DIAGNOSIS — Z923 Personal history of irradiation: Secondary | ICD-10-CM

## 2018-03-12 DIAGNOSIS — E871 Hypo-osmolality and hyponatremia: Secondary | ICD-10-CM

## 2018-03-12 DIAGNOSIS — Z87891 Personal history of nicotine dependence: Secondary | ICD-10-CM

## 2018-03-12 DIAGNOSIS — I10 Essential (primary) hypertension: Secondary | ICD-10-CM

## 2018-03-12 DIAGNOSIS — J449 Chronic obstructive pulmonary disease, unspecified: Secondary | ICD-10-CM

## 2018-03-12 LAB — COMPREHENSIVE METABOLIC PANEL
ALT: 20 U/L (ref 0–44)
AST: 17 U/L (ref 15–41)
Albumin: 3.8 g/dL (ref 3.5–5.0)
Alkaline Phosphatase: 82 U/L (ref 38–126)
Anion gap: 9 (ref 5–15)
BUN: 18 mg/dL (ref 6–20)
CHLORIDE: 97 mmol/L — AB (ref 98–111)
CO2: 27 mmol/L (ref 22–32)
CREATININE: 0.85 mg/dL (ref 0.61–1.24)
Calcium: 9.3 mg/dL (ref 8.9–10.3)
GFR calc Af Amer: >60 mL/min (ref >60)
GFR calc non Af Amer: >60 mL/min (ref >60)
Glucose, Bld: 170 mg/dL — ABNORMAL HIGH (ref 70–99)
POTASSIUM: 4.1 mmol/L (ref 3.5–5.1)
SODIUM: 133 mmol/L — AB (ref 135–145)
Total Bilirubin: 0.5 mg/dL (ref 0.3–1.2)
Total Protein: 6.8 g/dL (ref 6.5–8.1)

## 2018-03-12 LAB — CBC WITH DIFFERENTIAL/PLATELET
Basophils Absolute: 0 10*3/uL (ref 0–0.1)
Basophils Relative: 1 %
EOS ABS: 0.7 10*3/uL (ref 0–0.7)
EOS PCT: 8 %
HCT: 43.4 % (ref 40.0–52.0)
HEMOGLOBIN: 15 g/dL (ref 13.0–18.0)
LYMPHS ABS: 1.2 10*3/uL (ref 1.0–3.6)
LYMPHS PCT: 14 %
MCH: 30.3 pg (ref 26.0–34.0)
MCHC: 34.5 g/dL (ref 32.0–36.0)
MCV: 87.8 fL (ref 80.0–100.0)
MONOS PCT: 7 %
Monocytes Absolute: 0.6 10*3/uL (ref 0.2–1.0)
Neutro Abs: 6.3 10*3/uL (ref 1.4–6.5)
Neutrophils Relative %: 70 %
Platelets: 318 10*3/uL (ref 150–440)
RBC: 4.95 MIL/uL (ref 4.40–5.90)
RDW: 13.6 % (ref 11.5–14.5)
WBC: 8.8 10*3/uL (ref 3.8–10.6)

## 2018-03-12 MED ORDER — SODIUM CHLORIDE 0.9 % IV SOLN
Freq: Once | INTRAVENOUS | Status: AC
  Administered 2018-03-12: 10:00:00 via INTRAVENOUS
  Filled 2018-03-12: qty 250

## 2018-03-12 MED ORDER — SODIUM CHLORIDE 0.9 % IV SOLN
1000.0000 mg | Freq: Once | INTRAVENOUS | Status: AC
  Administered 2018-03-12: 1000 mg via INTRAVENOUS
  Filled 2018-03-12: qty 20

## 2018-03-12 NOTE — Progress Notes (Signed)
Here for follow up. Stated intermittent back pain R sided upper back-comes and goes -having this 2-3 x per week -last few min then gone. No pain today

## 2018-03-13 LAB — THYROID PANEL WITH TSH
FREE THYROXINE INDEX: 2.4 (ref 1.2–4.9)
T3 UPTAKE RATIO: 24 % (ref 24–39)
T4, Total: 10 ug/dL (ref 4.5–12.0)
TSH: 1.1 u[IU]/mL (ref 0.450–4.500)

## 2018-03-26 ENCOUNTER — Inpatient Hospital Stay: Payer: BLUE CROSS/BLUE SHIELD | Attending: Oncology

## 2018-03-26 ENCOUNTER — Inpatient Hospital Stay: Payer: BLUE CROSS/BLUE SHIELD

## 2018-03-26 VITALS — BP 142/85 | HR 83 | Resp 18

## 2018-03-26 DIAGNOSIS — Z923 Personal history of irradiation: Secondary | ICD-10-CM | POA: Diagnosis not present

## 2018-03-26 DIAGNOSIS — Z7984 Long term (current) use of oral hypoglycemic drugs: Secondary | ICD-10-CM | POA: Diagnosis not present

## 2018-03-26 DIAGNOSIS — Z23 Encounter for immunization: Secondary | ICD-10-CM | POA: Diagnosis not present

## 2018-03-26 DIAGNOSIS — Z87891 Personal history of nicotine dependence: Secondary | ICD-10-CM | POA: Insufficient documentation

## 2018-03-26 DIAGNOSIS — C3492 Malignant neoplasm of unspecified part of left bronchus or lung: Secondary | ICD-10-CM

## 2018-03-26 DIAGNOSIS — Z79899 Other long term (current) drug therapy: Secondary | ICD-10-CM | POA: Diagnosis not present

## 2018-03-26 DIAGNOSIS — E871 Hypo-osmolality and hyponatremia: Secondary | ICD-10-CM | POA: Diagnosis not present

## 2018-03-26 DIAGNOSIS — Z9221 Personal history of antineoplastic chemotherapy: Secondary | ICD-10-CM | POA: Diagnosis not present

## 2018-03-26 DIAGNOSIS — Z5112 Encounter for antineoplastic immunotherapy: Secondary | ICD-10-CM | POA: Diagnosis not present

## 2018-03-26 DIAGNOSIS — J449 Chronic obstructive pulmonary disease, unspecified: Secondary | ICD-10-CM | POA: Insufficient documentation

## 2018-03-26 DIAGNOSIS — I10 Essential (primary) hypertension: Secondary | ICD-10-CM | POA: Insufficient documentation

## 2018-03-26 DIAGNOSIS — E1165 Type 2 diabetes mellitus with hyperglycemia: Secondary | ICD-10-CM | POA: Diagnosis not present

## 2018-03-26 LAB — COMPREHENSIVE METABOLIC PANEL
ALBUMIN: 3.9 g/dL (ref 3.5–5.0)
ALK PHOS: 88 U/L (ref 38–126)
ALT: 20 U/L (ref 0–44)
AST: 20 U/L (ref 15–41)
Anion gap: 11 (ref 5–15)
BUN: 13 mg/dL (ref 6–20)
CHLORIDE: 97 mmol/L — AB (ref 98–111)
CO2: 26 mmol/L (ref 22–32)
CREATININE: 0.76 mg/dL (ref 0.61–1.24)
Calcium: 9.5 mg/dL (ref 8.9–10.3)
GFR calc Af Amer: >60 mL/min (ref >60)
GFR calc non Af Amer: >60 mL/min (ref >60)
GLUCOSE: 241 mg/dL — AB (ref 70–99)
POTASSIUM: 4.1 mmol/L (ref 3.5–5.1)
SODIUM: 134 mmol/L — AB (ref 135–145)
Total Bilirubin: 0.4 mg/dL (ref 0.3–1.2)
Total Protein: 6.6 g/dL (ref 6.5–8.1)

## 2018-03-26 LAB — CBC WITH DIFFERENTIAL/PLATELET
Abs Immature Granulocytes: 0.13 10*3/uL — ABNORMAL HIGH (ref 0.00–0.07)
Basophils Absolute: 0.1 10*3/uL (ref 0.0–0.1)
Basophils Relative: 1 %
Eosinophils Absolute: 0.5 10*3/uL (ref 0.0–0.5)
Eosinophils Relative: 6 %
HEMATOCRIT: 44.1 % (ref 39.0–52.0)
HEMOGLOBIN: 14.3 g/dL (ref 13.0–17.0)
Immature Granulocytes: 1 %
LYMPHS ABS: 1 10*3/uL (ref 0.7–4.0)
LYMPHS PCT: 11 %
MCH: 28.8 pg (ref 26.0–34.0)
MCHC: 32.4 g/dL (ref 30.0–36.0)
MCV: 88.7 fL (ref 80.0–100.0)
MONO ABS: 0.6 10*3/uL (ref 0.1–1.0)
MONOS PCT: 7 %
NEUTROS ABS: 6.9 10*3/uL (ref 1.7–7.7)
Neutrophils Relative %: 74 %
Platelets: 256 10*3/uL (ref 150–400)
RBC: 4.97 MIL/uL (ref 4.22–5.81)
RDW: 12.5 % (ref 11.5–15.5)
WBC: 9.3 10*3/uL (ref 4.0–10.5)
nRBC: 0 % (ref 0.0–0.2)

## 2018-03-26 MED ORDER — SODIUM CHLORIDE 0.9 % IV SOLN
1000.0000 mg | Freq: Once | INTRAVENOUS | Status: AC
  Administered 2018-03-26: 1000 mg via INTRAVENOUS
  Filled 2018-03-26: qty 20

## 2018-03-26 MED ORDER — SODIUM CHLORIDE 0.9 % IV SOLN
Freq: Once | INTRAVENOUS | Status: AC
  Administered 2018-03-26: 10:00:00 via INTRAVENOUS
  Filled 2018-03-26: qty 250

## 2018-03-27 ENCOUNTER — Other Ambulatory Visit: Payer: Self-pay | Admitting: Oncology

## 2018-04-05 NOTE — Progress Notes (Signed)
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  INTERVAL HISTORY: Patient returns to clinic today for further evaluation and consideration of cycle 16 of maintenance durvalumab.  He continues to tolerate his treatments well without significant side effects.  He currently feels well and is asymptomatic. He has no neurologic complaints. He has a good appetite and denies weight loss.  He denies any chest pain, cough, hemoptysis, or shortness of breath.  He has no nausea, vomiting, constipation, or diarrhea.  He has no urinary complaints.  Patient feels at his baseline offers no specific complaints today.  REVIEW OF SYSTEMS:   Review of Systems  Constitutional: Negative.  Negative for fever and weight loss.  HENT: Negative.  Negative for congestion.   Respiratory: Negative.  Negative for cough, hemoptysis and shortness of breath.   Cardiovascular: Negative.  Negative for chest pain and leg swelling.  Gastrointestinal: Negative.  Negative for abdominal pain and heartburn.  Genitourinary: Negative.  Negative for dysuria.  Musculoskeletal: Negative.  Negative for joint pain.  Skin: Negative.  Negative for rash.  Neurological: Negative.  Negative for sensory change, focal weakness and weakness.  Psychiatric/Behavioral: Negative.  The patient is not nervous/anxious.     As per HPI. Otherwise, a complete review of systems is negative.  PAST MEDICAL HISTORY: Past Medical History:  Diagnosis Date  . COPD (chronic obstructive pulmonary disease) ([place])   . Diabetes mellitus without complication ([place])   . Hypertension   . Lung cancer ([place]) 05/2017   Hx Chemo + rad tx's.  . Pneumonia     PAST SURGICAL  HISTORY: Past Surgical History:  Procedure Laterality Date  . ENDOBRONCHIAL ULTRASOUND N/A 05/12/2017   Procedure: ENDOBRONCHIAL ULTRASOUND;  Surgeon: Laverle Hobby, MD;  Location: [facility] ORS;  Service: Pulmonary;  Laterality: N/A;  . FRACTURE SURGERY Left    ANKLE X 2  . TONSILLECTOMY      FAMILY HISTORY: Family History  Problem Relation Age of Onset  . Stroke Mother   . Diabetes Mother   . Hypertension Mother   . Diabetes Father   . Hypertension Father   . Diabetes Sister   . Diabetes Brother   . Heart attack Paternal Uncle   . Stroke Maternal Grandmother     ADVANCED DIRECTIVES (Y/N):  N  HEALTH MAINTENANCE: Social History   Tobacco Use  . Smoking status: Former Smoker    Packs/day: 1.50    Last attempt to quit: 07/31/2017    Years since quitting: 0.7  . Smokeless tobacco: Never Used  Substance Use Topics  . Alcohol use: No  . Drug use: No     Colonoscopy:  PAP:  Bone density:  Lipid panel:  Allergies  Allergen Reactions  . Shrimp [Shellfish Allergy] Nausea And Vomiting    Current Outpatient Medications  Medication Sig Dispense Refill  . albuterol (PROVENTIL HFA;VENTOLIN HFA) 108 (90 BASE) MCG/ACT inhaler Inhale 2 puffs into the lungs every 6 (six) hours as needed for wheezing or shortness of breath.    Marland Kitchen albuterol (PROVENTIL) (2.5 MG/3ML) 0.083% nebulizer solution Take 2.5 mg every 6 (six) hours as needed by nebulization for wheezing or shortness of breath.    Jearl Klinefelter ELLIPTA 62.5-25 MCG/INH AEPB INHALE 1 PUFF INTO THE LUNGS  DAILY IN ADDITION TO QVAR 60 each 2  . beclomethasone (QVAR) 80 MCG/ACT inhaler Inhale 1 puff into the lungs 2 (two) times daily.    Marland Kitchen glipiZIDE (GLUCOTROL XL) 10 MG 24 hr tablet Take 10 mg by mouth daily with breakfast.    . ibuprofen (ADVIL,MOTRIN) 200 MG tablet Take 400-800 mg every 8 (eight) hours as needed by mouth (for pain.).    Marland Kitchen lisinopril-hydrochlorothiazide (PRINZIDE,ZESTORETIC) 20-25 MG tablet Take 1 tablet by mouth  daily.    . metFORMIN (GLUCOPHAGE) 1000 MG tablet Take 1,000 mg by mouth 2 (two) times daily.    Marland Kitchen omeprazole (PRILOSEC) 20 MG capsule TAKE 1 CAPSULE(20 MG) BY MOUTH DAILY 30 capsule 2   No current facility-administered medications for this visit.     OBJECTIVE: Vitals:   04/09/18 0922 04/09/18 0927  BP:  (!) 175/112  Pulse:  90  Resp: 20   Temp:  97.6 F (36.4 C)     Body mass index is 34.76 kg/m.    ECOG FS:0 - Asymptomatic  General: Well-developed, well-nourished, no acute distress. Eyes: Pink conjunctiva, anicteric sclera. HEENT: Normocephalic, moist mucous membranes. Lungs: Clear to auscultation bilaterally. Heart: Regular rate and rhythm. No rubs, murmurs, or gallops. Abdomen: Soft, nontender, nondistended. No organomegaly noted, normoactive bowel sounds. Musculoskeletal: No edema, cyanosis, or clubbing. Neuro: Alert, answering all questions appropriately. Cranial nerves grossly intact. Skin: No rashes or petechiae noted. Psych: Normal affect.  LAB RESULTS:  Lab Results  Component Value Date   NA 133 (L) 04/09/2018   K 4.3 04/09/2018   CL 98 04/09/2018   CO2 26 04/09/2018   GLUCOSE 247 (H) 04/09/2018   BUN 16 04/09/2018   CREATININE 0.68 04/09/2018   CALCIUM 9.7 04/09/2018   PROT 6.8 04/09/2018   ALBUMIN 3.8 04/09/2018   AST 21 04/09/2018   ALT 23 04/09/2018   ALKPHOS 99 04/09/2018   BILITOT 0.6 04/09/2018   GFRNONAA >60 04/09/2018   GFRAA >60 04/09/2018    Lab Results  Component Value Date   WBC 7.6 04/09/2018   NEUTROABS 5.5 04/09/2018   HGB 14.6 04/09/2018   HCT 44.2 04/09/2018   MCV 87.2 04/09/2018   PLT 255 04/09/2018     STUDIES: No results found.  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  PLAN:    1. 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  Patient was also discussed at cancer conference.  MRI of the brain is negative.  CT scan results from  January 26, 2018 reviewed independently with no new or progressive malignancy noted.  Proceed with cycle 16 of maintenance durvalumab today.  Patient initiated his maintenance treatment on September 11, 2017.  Continue treatment for a total of 12 months completing in March 2020.  Patient continues to decline port placement.  Return to clinic in 2 weeks for treatment only and then in 4 weeks for further evaluation and consideration of cycle 18.    2.  Hyperglycemia: Chronic and unchanged.  Patient's glucose is significantly elevated at 247 today.  Patient is no longer receiving dexamethasone as a premedication.  Continue follow-up and treatment with primary care. 3.  Indigestion: Patient does not complain of this today.  Continue omeprazole as prescribed. 4.  Pain: Patient does not complain of this today.  Continue tramadol as needed. 5.  Cough/congestion: Resolved. 6.  Hypertension: Chronic.  Patient's blood pressure remains persistently elevated.  Continue current treatment as prescribed.  Follow-up  with primary care for further evaluation. 7.  Hyponatremia: Chronic and relatively unchanged.  Patient expressed understanding and was in agreement with this plan. He also understands that He can call clinic at any time with any questions, concerns, or complaints.   Cancer Staging Squamous cell lung cancer, left [facility]) Staging form: Lung, AJCC 8th Edition - Clinical stage from 05/16/2017: Stage IIIB (cT3, cN2, cM0) - Signed by Lloyd Huger, MD on 05/16/2017   Lloyd Huger, MD   04/13/2018 2:29 PM

## 2018-04-09 ENCOUNTER — Other Ambulatory Visit: Payer: Self-pay

## 2018-04-09 ENCOUNTER — Inpatient Hospital Stay: Payer: BLUE CROSS/BLUE SHIELD

## 2018-04-09 ENCOUNTER — Encounter: Payer: Self-pay | Admitting: Oncology

## 2018-04-09 ENCOUNTER — Inpatient Hospital Stay (HOSPITAL_BASED_OUTPATIENT_CLINIC_OR_DEPARTMENT_OTHER): Payer: BLUE CROSS/BLUE SHIELD | Admitting: Oncology

## 2018-04-09 VITALS — BP 175/112 | HR 90 | Temp 97.6°F | Resp 20 | Ht 68.0 in | Wt 228.6 lb

## 2018-04-09 DIAGNOSIS — I10 Essential (primary) hypertension: Secondary | ICD-10-CM

## 2018-04-09 DIAGNOSIS — E1165 Type 2 diabetes mellitus with hyperglycemia: Secondary | ICD-10-CM | POA: Diagnosis not present

## 2018-04-09 DIAGNOSIS — C3492 Malignant neoplasm of unspecified part of left bronchus or lung: Secondary | ICD-10-CM | POA: Diagnosis not present

## 2018-04-09 DIAGNOSIS — Z23 Encounter for immunization: Secondary | ICD-10-CM | POA: Diagnosis not present

## 2018-04-09 DIAGNOSIS — J449 Chronic obstructive pulmonary disease, unspecified: Secondary | ICD-10-CM

## 2018-04-09 DIAGNOSIS — Z79899 Other long term (current) drug therapy: Secondary | ICD-10-CM

## 2018-04-09 DIAGNOSIS — Z5112 Encounter for antineoplastic immunotherapy: Secondary | ICD-10-CM

## 2018-04-09 DIAGNOSIS — E871 Hypo-osmolality and hyponatremia: Secondary | ICD-10-CM

## 2018-04-09 DIAGNOSIS — Z87891 Personal history of nicotine dependence: Secondary | ICD-10-CM

## 2018-04-09 DIAGNOSIS — Z9221 Personal history of antineoplastic chemotherapy: Secondary | ICD-10-CM

## 2018-04-09 DIAGNOSIS — Z923 Personal history of irradiation: Secondary | ICD-10-CM

## 2018-04-09 DIAGNOSIS — Z7984 Long term (current) use of oral hypoglycemic drugs: Secondary | ICD-10-CM

## 2018-04-09 LAB — CBC WITH DIFFERENTIAL/PLATELET
ABS IMMATURE GRANULOCYTES: 0.15 10*3/uL — AB (ref 0.00–0.07)
Basophils Absolute: 0.1 10*3/uL (ref 0.0–0.1)
Basophils Relative: 2 %
EOS ABS: 0.4 10*3/uL (ref 0.0–0.5)
Eosinophils Relative: 5 %
HEMATOCRIT: 44.2 % (ref 39.0–52.0)
Hemoglobin: 14.6 g/dL (ref 13.0–17.0)
IMMATURE GRANULOCYTES: 2 %
Lymphocytes Relative: 12 %
Lymphs Abs: 0.9 10*3/uL (ref 0.7–4.0)
MCH: 28.8 pg (ref 26.0–34.0)
MCHC: 33 g/dL (ref 30.0–36.0)
MCV: 87.2 fL (ref 80.0–100.0)
MONO ABS: 0.6 10*3/uL (ref 0.1–1.0)
MONOS PCT: 7 %
NEUTROS ABS: 5.5 10*3/uL (ref 1.7–7.7)
NRBC: 0 % (ref 0.0–0.2)
Neutrophils Relative %: 72 %
Platelets: 255 10*3/uL (ref 150–400)
RBC: 5.07 MIL/uL (ref 4.22–5.81)
RDW: 12.4 % (ref 11.5–15.5)
WBC: 7.6 10*3/uL (ref 4.0–10.5)

## 2018-04-09 LAB — COMPREHENSIVE METABOLIC PANEL
ALK PHOS: 99 U/L (ref 38–126)
ALT: 23 U/L (ref 0–44)
AST: 21 U/L (ref 15–41)
Albumin: 3.8 g/dL (ref 3.5–5.0)
Anion gap: 9 (ref 5–15)
BUN: 16 mg/dL (ref 6–20)
CALCIUM: 9.7 mg/dL (ref 8.9–10.3)
CO2: 26 mmol/L (ref 22–32)
CREATININE: 0.68 mg/dL (ref 0.61–1.24)
Chloride: 98 mmol/L (ref 98–111)
GFR calc Af Amer: >60 mL/min (ref >60)
Glucose, Bld: 247 mg/dL — ABNORMAL HIGH (ref 70–99)
Potassium: 4.3 mmol/L (ref 3.5–5.1)
Sodium: 133 mmol/L — ABNORMAL LOW (ref 135–145)
TOTAL PROTEIN: 6.8 g/dL (ref 6.5–8.1)
Total Bilirubin: 0.6 mg/dL (ref 0.3–1.2)

## 2018-04-09 MED ORDER — SODIUM CHLORIDE 0.9 % IV SOLN
1000.0000 mg | Freq: Once | INTRAVENOUS | Status: AC
  Administered 2018-04-09: 1000 mg via INTRAVENOUS
  Filled 2018-04-09: qty 20

## 2018-04-09 MED ORDER — INFLUENZA VAC SPLIT QUAD 0.5 ML IM SUSY
0.5000 mL | PREFILLED_SYRINGE | Freq: Once | INTRAMUSCULAR | Status: AC
  Administered 2018-04-09: 0.5 mL via INTRAMUSCULAR
  Filled 2018-04-09: qty 0.5

## 2018-04-09 MED ORDER — SODIUM CHLORIDE 0.9 % IV SOLN
Freq: Once | INTRAVENOUS | Status: AC
  Administered 2018-04-09: 10:00:00 via INTRAVENOUS
  Filled 2018-04-09: qty 250

## 2018-04-09 NOTE — Progress Notes (Signed)
Patient here for follow up no complaints

## 2018-04-23 ENCOUNTER — Inpatient Hospital Stay: Payer: BLUE CROSS/BLUE SHIELD

## 2018-04-23 ENCOUNTER — Other Ambulatory Visit: Payer: Self-pay

## 2018-04-23 ENCOUNTER — Inpatient Hospital Stay: Payer: BLUE CROSS/BLUE SHIELD | Attending: Oncology

## 2018-04-23 VITALS — BP 138/84 | HR 103 | Temp 97.1°F | Resp 20 | Wt 223.2 lb

## 2018-04-23 DIAGNOSIS — C3492 Malignant neoplasm of unspecified part of left bronchus or lung: Secondary | ICD-10-CM | POA: Diagnosis present

## 2018-04-23 DIAGNOSIS — J449 Chronic obstructive pulmonary disease, unspecified: Secondary | ICD-10-CM | POA: Insufficient documentation

## 2018-04-23 DIAGNOSIS — Z9221 Personal history of antineoplastic chemotherapy: Secondary | ICD-10-CM | POA: Insufficient documentation

## 2018-04-23 DIAGNOSIS — Z87891 Personal history of nicotine dependence: Secondary | ICD-10-CM | POA: Insufficient documentation

## 2018-04-23 DIAGNOSIS — I10 Essential (primary) hypertension: Secondary | ICD-10-CM | POA: Diagnosis not present

## 2018-04-23 DIAGNOSIS — Z79899 Other long term (current) drug therapy: Secondary | ICD-10-CM | POA: Diagnosis not present

## 2018-04-23 DIAGNOSIS — Z7984 Long term (current) use of oral hypoglycemic drugs: Secondary | ICD-10-CM | POA: Insufficient documentation

## 2018-04-23 DIAGNOSIS — Z5112 Encounter for antineoplastic immunotherapy: Secondary | ICD-10-CM | POA: Diagnosis not present

## 2018-04-23 DIAGNOSIS — Z923 Personal history of irradiation: Secondary | ICD-10-CM | POA: Insufficient documentation

## 2018-04-23 DIAGNOSIS — E1165 Type 2 diabetes mellitus with hyperglycemia: Secondary | ICD-10-CM | POA: Insufficient documentation

## 2018-04-23 LAB — COMPREHENSIVE METABOLIC PANEL
ALK PHOS: 92 U/L (ref 38–126)
ALT: 27 U/L (ref 0–44)
AST: 21 U/L (ref 15–41)
Albumin: 3.9 g/dL (ref 3.5–5.0)
Anion gap: 9 (ref 5–15)
BILIRUBIN TOTAL: 0.2 mg/dL — AB (ref 0.3–1.2)
BUN: 16 mg/dL (ref 6–20)
CHLORIDE: 98 mmol/L (ref 98–111)
CO2: 25 mmol/L (ref 22–32)
CREATININE: 0.72 mg/dL (ref 0.61–1.24)
Calcium: 9.3 mg/dL (ref 8.9–10.3)
GFR calc Af Amer: >60 mL/min (ref >60)
Glucose, Bld: 185 mg/dL — ABNORMAL HIGH (ref 70–99)
Potassium: 4 mmol/L (ref 3.5–5.1)
Sodium: 132 mmol/L — ABNORMAL LOW (ref 135–145)
TOTAL PROTEIN: 7.1 g/dL (ref 6.5–8.1)

## 2018-04-23 LAB — CBC WITH DIFFERENTIAL/PLATELET
Abs Immature Granulocytes: 0.13 10*3/uL — ABNORMAL HIGH (ref 0.00–0.07)
Basophils Absolute: 0.1 10*3/uL (ref 0.0–0.1)
Basophils Relative: 2 %
EOS PCT: 5 %
Eosinophils Absolute: 0.4 10*3/uL (ref 0.0–0.5)
HEMATOCRIT: 43.7 % (ref 39.0–52.0)
Hemoglobin: 14.8 g/dL (ref 13.0–17.0)
Immature Granulocytes: 2 %
LYMPHS PCT: 14 %
Lymphs Abs: 1.2 10*3/uL (ref 0.7–4.0)
MCH: 29.1 pg (ref 26.0–34.0)
MCHC: 33.9 g/dL (ref 30.0–36.0)
MCV: 86 fL (ref 80.0–100.0)
MONO ABS: 0.7 10*3/uL (ref 0.1–1.0)
Monocytes Relative: 8 %
Neutro Abs: 5.8 10*3/uL (ref 1.7–7.7)
Neutrophils Relative %: 69 %
Platelets: 280 10*3/uL (ref 150–400)
RBC: 5.08 MIL/uL (ref 4.22–5.81)
RDW: 12.7 % (ref 11.5–15.5)
WBC: 8.3 10*3/uL (ref 4.0–10.5)
nRBC: 0 % (ref 0.0–0.2)

## 2018-04-23 MED ORDER — SODIUM CHLORIDE 0.9 % IV SOLN
Freq: Once | INTRAVENOUS | Status: AC
  Administered 2018-04-23: 14:00:00 via INTRAVENOUS
  Filled 2018-04-23: qty 250

## 2018-04-23 MED ORDER — SODIUM CHLORIDE 0.9 % IV SOLN
1000.0000 mg | Freq: Once | INTRAVENOUS | Status: AC
  Administered 2018-04-23: 1000 mg via INTRAVENOUS
  Filled 2018-04-23: qty 20

## 2018-04-24 LAB — THYROID PANEL WITH TSH
Free Thyroxine Index: 2.4 (ref 1.2–4.9)
T3 UPTAKE RATIO: 26 % (ref 24–39)
T4 TOTAL: 9.3 ug/dL (ref 4.5–12.0)
TSH: 1.18 u[IU]/mL (ref 0.450–4.500)

## 2018-04-26 ENCOUNTER — Other Ambulatory Visit: Payer: Self-pay | Admitting: Internal Medicine

## 2018-04-27 ENCOUNTER — Other Ambulatory Visit: Payer: Self-pay | Admitting: *Deleted

## 2018-04-27 MED ORDER — UMECLIDINIUM-VILANTEROL 62.5-25 MCG/INH IN AEPB: 1.0000 | INHALATION_SPRAY | Freq: Every day | RESPIRATORY_TRACT | 0 refills | Status: DC

## 2018-05-02 NOTE — Progress Notes (Signed)
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  INTERVAL HISTORY: Patient returns to clinic today for further evaluation and consideration of cycle 18 of maintenance durvalumab.  He continues to tolerate his treatments well without significant side effects.  He currently feels well and is asymptomatic. He has no neurologic complaints. He has a good appetite and denies weight loss.  He denies any chest pain, cough, hemoptysis, or shortness of breath.  He has no nausea, vomiting, constipation, or diarrhea.  He has no urinary complaints.  Patient offers no specific complaints today.  REVIEW OF SYSTEMS:   Review of Systems  Constitutional: Negative.  Negative for fever and weight loss.  HENT: Negative.  Negative for congestion.   Respiratory: Negative.  Negative for cough, hemoptysis and shortness of breath.   Cardiovascular: Negative.  Negative for chest pain and leg swelling.  Gastrointestinal: Negative.  Negative for abdominal pain and heartburn.  Genitourinary: Negative.  Negative for dysuria.  Musculoskeletal: Negative.  Negative for joint pain.  Skin: Negative.  Negative for rash.  Neurological: Negative.  Negative for sensory change, focal weakness and weakness.  Psychiatric/Behavioral: Negative.  The patient is not nervous/anxious.     As per HPI. Otherwise, a complete review of systems is negative.  PAST MEDICAL HISTORY: Past Medical History:  Diagnosis Date  . COPD (chronic obstructive pulmonary disease) ([place])   . Diabetes mellitus without complication ([place])   . Hypertension   . Lung cancer ([place]) 05/2017   Hx Chemo + rad tx's.  . Pneumonia     PAST SURGICAL HISTORY: Past Surgical  History:  Procedure Laterality Date  . ENDOBRONCHIAL ULTRASOUND N/A 05/12/2017   Procedure: ENDOBRONCHIAL ULTRASOUND;  Surgeon: Laverle Hobby, MD;  Location: [facility] ORS;  Service: Pulmonary;  Laterality: N/A;  . FRACTURE SURGERY Left    ANKLE X 2  . TONSILLECTOMY      FAMILY HISTORY: Family History  Problem Relation Age of Onset  . Stroke Mother   . Diabetes Mother   . Hypertension Mother   . Diabetes Father   . Hypertension Father   . Diabetes Sister   . Diabetes Brother   . Heart attack Paternal Uncle   . Stroke Maternal Grandmother     ADVANCED DIRECTIVES (Y/N):  N  HEALTH MAINTENANCE: Social History   Tobacco Use  . Smoking status: Former Smoker    Packs/day: 1.50    Last attempt to quit: 07/31/2017    Years since quitting: 0.7  . Smokeless tobacco: Never Used  Substance Use Topics  . Alcohol use: No  . Drug use: No     Colonoscopy:  PAP:  Bone density:  Lipid panel:  Allergies  Allergen Reactions  . Shrimp [Shellfish Allergy] Nausea And Vomiting    Current Outpatient Medications  Medication Sig Dispense Refill  . albuterol (PROVENTIL HFA;VENTOLIN HFA) 108 (90 BASE) MCG/ACT inhaler Inhale 2 puffs into the lungs every 6 (six) hours as needed for wheezing or shortness of breath.    Marland Kitchen albuterol (PROVENTIL) (2.5 MG/3ML) 0.083% nebulizer solution Take 2.5 mg every 6 (six) hours as needed by nebulization for wheezing or shortness of breath.    . beclomethasone (QVAR) 80 MCG/ACT inhaler Inhale 1 puff into the lungs 2 (two) times daily.    Marland Kitchen  glipiZIDE (GLUCOTROL XL) 10 MG 24 hr tablet Take 10 mg by mouth daily with breakfast.    . ibuprofen (ADVIL,MOTRIN) 200 MG tablet Take 400-800 mg every 8 (eight) hours as needed by mouth (for pain.).    Marland Kitchen lisinopril-hydrochlorothiazide (PRINZIDE,ZESTORETIC) 20-25 MG tablet Take 1 tablet by mouth daily.    . metFORMIN (GLUCOPHAGE) 1000 MG tablet Take 1,000 mg by mouth 2 (two) times daily.    Marland Kitchen omeprazole (PRILOSEC) 20 MG  capsule TAKE 1 CAPSULE(20 MG) BY MOUTH DAILY 30 capsule 2  . umeclidinium-vilanterol (ANORO ELLIPTA) 62.5-25 MCG/INH AEPB Inhale 1 puff into the lungs daily. 60 each 0   No current facility-administered medications for this visit.     OBJECTIVE: Vitals:   05/07/18 1331 05/07/18 1335  BP:  (!) 144/86  Pulse:  96  Resp: 16   Temp:  (!) 97.1 F (36.2 C)     Body mass index is 33.8 kg/m.    ECOG FS:0 - Asymptomatic  General: Well-developed, well-nourished, no acute distress. Eyes: Pink conjunctiva, anicteric sclera. HEENT: Normocephalic, moist mucous membranes. Lungs: Clear to auscultation bilaterally. Heart: Regular rate and rhythm. No rubs, murmurs, or gallops. Abdomen: Soft, nontender, nondistended. No organomegaly noted, normoactive bowel sounds. Musculoskeletal: No edema, cyanosis, or clubbing. Neuro: Alert, answering all questions appropriately. Cranial nerves grossly intact. Skin: No rashes or petechiae noted. Psych: Normal affect.  LAB RESULTS:  Lab Results  Component Value Date   NA 136 05/07/2018   K 3.8 05/07/2018   CL 99 05/07/2018   CO2 25 05/07/2018   GLUCOSE 206 (H) 05/07/2018   BUN 17 05/07/2018   CREATININE 0.95 05/07/2018   CALCIUM 9.2 05/07/2018   PROT 6.7 05/07/2018   ALBUMIN 3.8 05/07/2018   AST 20 05/07/2018   ALT 23 05/07/2018   ALKPHOS 80 05/07/2018   BILITOT 0.5 05/07/2018   GFRNONAA >60 05/07/2018   GFRAA >60 05/07/2018    Lab Results  Component Value Date   WBC 8.9 05/07/2018   NEUTROABS 6.6 05/07/2018   HGB 14.7 05/07/2018   HCT 44.7 05/07/2018   MCV 88.0 05/07/2018   PLT 255 05/07/2018     STUDIES: No results found.  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  PLAN:    1. 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  Patient was also discussed at cancer conference.  MRI of the brain is negative.  CT scan results from January 26, 2018 reviewed  independently with no new or progressive malignancy noted.  Proceed with cycle 18 of maintenance durvalumab today. Patient initiated his maintenance treatment on September 11, 2017.  Continue treatment for a total of 12 months completing in March 2020.  Patient continues to decline port placement.  Return to clinic in 2 weeks for treatment only and then in 4 weeks for further evaluation and consideration of cycle 20.      2.  Hyperglycemia: Chronic and unchanged.  Patient's glucose is 206 today.  He is not receiving steroids as a premedication.  Continue follow-up and treatment with primary care. 3.  Indigestion: Patient does not complain of this today.  Continue omeprazole as prescribed. 4.  Pain: Patient does not complain of this today.  Continue tramadol as needed. 5.  Cough/congestion: Resolved. 6.  Hypertension: Chronic.  Patient's blood pressure is moderately elevated today.  Continue current treatment as prescribed.  Follow-up with primary care for further evaluation. 7.  Hyponatremia: Resolved.  Patient expressed  understanding and was in agreement with this plan. He also understands that He can call clinic at any time with any questions, concerns, or complaints.   Cancer Staging Squamous cell lung cancer, left [facility]) Staging form: Lung, AJCC 8th Edition - Clinical stage from 05/16/2017: Stage IIIB (cT3, cN2, cM0) - Signed by Lloyd Huger, MD on 05/16/2017   Lloyd Huger, MD   05/13/2018 9:04 AM

## 2018-05-07 ENCOUNTER — Inpatient Hospital Stay: Payer: BLUE CROSS/BLUE SHIELD

## 2018-05-07 ENCOUNTER — Other Ambulatory Visit: Payer: Self-pay

## 2018-05-07 ENCOUNTER — Encounter: Payer: Self-pay | Admitting: Oncology

## 2018-05-07 ENCOUNTER — Inpatient Hospital Stay (HOSPITAL_BASED_OUTPATIENT_CLINIC_OR_DEPARTMENT_OTHER): Payer: BLUE CROSS/BLUE SHIELD | Admitting: Oncology

## 2018-05-07 VITALS — BP 144/86 | HR 96 | Temp 97.1°F | Resp 16 | Ht 68.5 in | Wt 225.6 lb

## 2018-05-07 DIAGNOSIS — C3492 Malignant neoplasm of unspecified part of left bronchus or lung: Secondary | ICD-10-CM | POA: Diagnosis not present

## 2018-05-07 DIAGNOSIS — Z79899 Other long term (current) drug therapy: Secondary | ICD-10-CM

## 2018-05-07 DIAGNOSIS — Z5112 Encounter for antineoplastic immunotherapy: Secondary | ICD-10-CM | POA: Diagnosis not present

## 2018-05-07 DIAGNOSIS — E1165 Type 2 diabetes mellitus with hyperglycemia: Secondary | ICD-10-CM

## 2018-05-07 DIAGNOSIS — I10 Essential (primary) hypertension: Secondary | ICD-10-CM | POA: Diagnosis not present

## 2018-05-07 DIAGNOSIS — Z7984 Long term (current) use of oral hypoglycemic drugs: Secondary | ICD-10-CM

## 2018-05-07 DIAGNOSIS — J449 Chronic obstructive pulmonary disease, unspecified: Secondary | ICD-10-CM

## 2018-05-07 DIAGNOSIS — Z9221 Personal history of antineoplastic chemotherapy: Secondary | ICD-10-CM

## 2018-05-07 DIAGNOSIS — Z923 Personal history of irradiation: Secondary | ICD-10-CM

## 2018-05-07 DIAGNOSIS — Z87891 Personal history of nicotine dependence: Secondary | ICD-10-CM

## 2018-05-07 LAB — COMPREHENSIVE METABOLIC PANEL
ALT: 23 U/L (ref 0–44)
AST: 20 U/L (ref 15–41)
Albumin: 3.8 g/dL (ref 3.5–5.0)
Alkaline Phosphatase: 80 U/L (ref 38–126)
Anion gap: 12 (ref 5–15)
BUN: 17 mg/dL (ref 6–20)
CHLORIDE: 99 mmol/L (ref 98–111)
CO2: 25 mmol/L (ref 22–32)
Calcium: 9.2 mg/dL (ref 8.9–10.3)
Creatinine, Ser: 0.95 mg/dL (ref 0.61–1.24)
GFR calc Af Amer: >60 mL/min (ref >60)
Glucose, Bld: 206 mg/dL — ABNORMAL HIGH (ref 70–99)
POTASSIUM: 3.8 mmol/L (ref 3.5–5.1)
SODIUM: 136 mmol/L (ref 135–145)
Total Bilirubin: 0.5 mg/dL (ref 0.3–1.2)
Total Protein: 6.7 g/dL (ref 6.5–8.1)

## 2018-05-07 LAB — CBC WITH DIFFERENTIAL/PLATELET
Abs Immature Granulocytes: 0.1 10*3/uL — ABNORMAL HIGH (ref 0.00–0.07)
Basophils Absolute: 0.1 10*3/uL (ref 0.0–0.1)
Basophils Relative: 1 %
Eosinophils Absolute: 0.4 10*3/uL (ref 0.0–0.5)
Eosinophils Relative: 4 %
HCT: 44.7 % (ref 39.0–52.0)
HEMOGLOBIN: 14.7 g/dL (ref 13.0–17.0)
Immature Granulocytes: 1 %
LYMPHS PCT: 12 %
Lymphs Abs: 1.1 10*3/uL (ref 0.7–4.0)
MCH: 28.9 pg (ref 26.0–34.0)
MCHC: 32.9 g/dL (ref 30.0–36.0)
MCV: 88 fL (ref 80.0–100.0)
MONO ABS: 0.6 10*3/uL (ref 0.1–1.0)
Monocytes Relative: 7 %
NEUTROS ABS: 6.6 10*3/uL (ref 1.7–7.7)
Neutrophils Relative %: 75 %
Platelets: 255 10*3/uL (ref 150–400)
RBC: 5.08 MIL/uL (ref 4.22–5.81)
RDW: 12.8 % (ref 11.5–15.5)
WBC: 8.9 10*3/uL (ref 4.0–10.5)
nRBC: 0 % (ref 0.0–0.2)

## 2018-05-07 MED ORDER — SODIUM CHLORIDE 0.9 % IV SOLN
Freq: Once | INTRAVENOUS | Status: AC
  Administered 2018-05-07: 15:00:00 via INTRAVENOUS
  Filled 2018-05-07: qty 250

## 2018-05-07 MED ORDER — SODIUM CHLORIDE 0.9 % IV SOLN
1000.0000 mg | Freq: Once | INTRAVENOUS | Status: AC
  Administered 2018-05-07: 1000 mg via INTRAVENOUS
  Filled 2018-05-07: qty 20

## 2018-05-07 NOTE — Progress Notes (Signed)
Patient here for follow up. Reports still being fatigued.

## 2018-05-21 ENCOUNTER — Inpatient Hospital Stay: Payer: BLUE CROSS/BLUE SHIELD | Attending: Oncology

## 2018-05-21 ENCOUNTER — Inpatient Hospital Stay: Payer: BLUE CROSS/BLUE SHIELD

## 2018-05-21 VITALS — BP 106/75 | HR 96 | Temp 97.6°F | Resp 20 | Wt 226.8 lb

## 2018-05-21 DIAGNOSIS — K30 Functional dyspepsia: Secondary | ICD-10-CM | POA: Diagnosis not present

## 2018-05-21 DIAGNOSIS — Z87891 Personal history of nicotine dependence: Secondary | ICD-10-CM | POA: Insufficient documentation

## 2018-05-21 DIAGNOSIS — I10 Essential (primary) hypertension: Secondary | ICD-10-CM | POA: Insufficient documentation

## 2018-05-21 DIAGNOSIS — C3492 Malignant neoplasm of unspecified part of left bronchus or lung: Secondary | ICD-10-CM | POA: Diagnosis not present

## 2018-05-21 DIAGNOSIS — Z79899 Other long term (current) drug therapy: Secondary | ICD-10-CM | POA: Diagnosis not present

## 2018-05-21 DIAGNOSIS — Z5112 Encounter for antineoplastic immunotherapy: Secondary | ICD-10-CM | POA: Insufficient documentation

## 2018-05-21 DIAGNOSIS — J449 Chronic obstructive pulmonary disease, unspecified: Secondary | ICD-10-CM | POA: Diagnosis not present

## 2018-05-21 DIAGNOSIS — Z7984 Long term (current) use of oral hypoglycemic drugs: Secondary | ICD-10-CM | POA: Diagnosis not present

## 2018-05-21 DIAGNOSIS — Z9221 Personal history of antineoplastic chemotherapy: Secondary | ICD-10-CM | POA: Diagnosis not present

## 2018-05-21 DIAGNOSIS — E119 Type 2 diabetes mellitus without complications: Secondary | ICD-10-CM | POA: Diagnosis not present

## 2018-05-21 DIAGNOSIS — Z923 Personal history of irradiation: Secondary | ICD-10-CM | POA: Insufficient documentation

## 2018-05-21 DIAGNOSIS — E1165 Type 2 diabetes mellitus with hyperglycemia: Secondary | ICD-10-CM | POA: Insufficient documentation

## 2018-05-21 LAB — COMPREHENSIVE METABOLIC PANEL
ALT: 24 U/L (ref 0–44)
AST: 18 U/L (ref 15–41)
Albumin: 4 g/dL (ref 3.5–5.0)
Alkaline Phosphatase: 98 U/L (ref 38–126)
Anion gap: 11 (ref 5–15)
BUN: 17 mg/dL (ref 6–20)
CO2: 25 mmol/L (ref 22–32)
Calcium: 8.9 mg/dL (ref 8.9–10.3)
Chloride: 101 mmol/L (ref 98–111)
Creatinine, Ser: 0.92 mg/dL (ref 0.61–1.24)
GFR calc Af Amer: >60 mL/min (ref >60)
GFR calc non Af Amer: >60 mL/min (ref >60)
Glucose, Bld: 169 mg/dL — ABNORMAL HIGH (ref 70–99)
Potassium: 3.9 mmol/L (ref 3.5–5.1)
SODIUM: 137 mmol/L (ref 135–145)
Total Bilirubin: 0.5 mg/dL (ref 0.3–1.2)
Total Protein: 7.1 g/dL (ref 6.5–8.1)

## 2018-05-21 LAB — CBC WITH DIFFERENTIAL/PLATELET
Abs Immature Granulocytes: 0.11 10*3/uL — ABNORMAL HIGH (ref 0.00–0.07)
Basophils Absolute: 0.1 10*3/uL (ref 0.0–0.1)
Basophils Relative: 1 %
EOS ABS: 0.3 10*3/uL (ref 0.0–0.5)
Eosinophils Relative: 4 %
HCT: 46.2 % (ref 39.0–52.0)
Hemoglobin: 15.1 g/dL (ref 13.0–17.0)
Immature Granulocytes: 1 %
Lymphocytes Relative: 13 %
Lymphs Abs: 1.1 10*3/uL (ref 0.7–4.0)
MCH: 28.8 pg (ref 26.0–34.0)
MCHC: 32.7 g/dL (ref 30.0–36.0)
MCV: 88.2 fL (ref 80.0–100.0)
Monocytes Absolute: 0.7 10*3/uL (ref 0.1–1.0)
Monocytes Relative: 8 %
Neutro Abs: 6.1 10*3/uL (ref 1.7–7.7)
Neutrophils Relative %: 73 %
Platelets: 277 10*3/uL (ref 150–400)
RBC: 5.24 MIL/uL (ref 4.22–5.81)
RDW: 12.8 % (ref 11.5–15.5)
WBC: 8.4 10*3/uL (ref 4.0–10.5)
nRBC: 0 % (ref 0.0–0.2)

## 2018-05-21 MED ORDER — SODIUM CHLORIDE 0.9 % IV SOLN
Freq: Once | INTRAVENOUS | Status: AC
  Administered 2018-05-21: 14:00:00 via INTRAVENOUS
  Filled 2018-05-21: qty 250

## 2018-05-21 MED ORDER — SODIUM CHLORIDE 0.9 % IV SOLN
1000.0000 mg | Freq: Once | INTRAVENOUS | Status: AC
  Administered 2018-05-21: 1000 mg via INTRAVENOUS
  Filled 2018-05-21: qty 20

## 2018-05-31 NOTE — Progress Notes (Signed)
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  INTERVAL HISTORY: Patient returns to clinic today for further evaluation and consideration of cycle 20 of maintenance durvalumab.  He continues to feel well and remains asymptomatic.  He continues to be active and work full-time.  He is tolerating his treatments without significant side effects.  He has no neurologic complaints. He has a good appetite and denies weight loss.  He denies any chest pain, cough, hemoptysis, or shortness of breath.  He has no nausea, vomiting, constipation, or diarrhea.  He has no urinary complaints.  Patient feels at his baseline offers no specific complaints today.    REVIEW OF SYSTEMS:   Review of Systems  Constitutional: Negative.  Negative for fever and weight loss.  HENT: Negative.  Negative for congestion.   Respiratory: Negative.  Negative for cough, hemoptysis and shortness of breath.   Cardiovascular: Negative.  Negative for chest pain and leg swelling.  Gastrointestinal: Negative.  Negative for abdominal pain and heartburn.  Genitourinary: Negative.  Negative for dysuria.  Musculoskeletal: Negative.  Negative for joint pain.  Skin: Negative.  Negative for rash.  Neurological: Negative.  Negative for sensory change, focal weakness and weakness.  Psychiatric/Behavioral: Negative.  The patient is not nervous/anxious.     As per HPI. Otherwise, a complete review of systems is negative.  PAST MEDICAL HISTORY: Past Medical History:  Diagnosis Date  . COPD (chronic obstructive pulmonary disease) ([place])   . Diabetes mellitus without complication ([place])   . Hypertension   . Lung cancer ([place]) 05/2017   Hx Chemo + rad  tx's.  . Pneumonia     PAST SURGICAL HISTORY: Past Surgical History:  Procedure Laterality Date  . ENDOBRONCHIAL ULTRASOUND N/A 05/12/2017   Procedure: ENDOBRONCHIAL ULTRASOUND;  Surgeon: Laverle Hobby, MD;  Location: [facility] ORS;  Service: Pulmonary;  Laterality: N/A;  . FRACTURE SURGERY Left    ANKLE X 2  . TONSILLECTOMY      FAMILY HISTORY: Family History  Problem Relation Age of Onset  . Stroke Mother   . Diabetes Mother   . Hypertension Mother   . Diabetes Father   . Hypertension Father   . Diabetes Sister   . Diabetes Brother   . Heart attack Paternal Uncle   . Stroke Maternal Grandmother     ADVANCED DIRECTIVES (Y/N):  N  HEALTH MAINTENANCE: Social History   Tobacco Use  . Smoking status: Former Smoker    Packs/day: 1.50    Last attempt to quit: 07/31/2017    Years since quitting: 0.8  . Smokeless tobacco: Never Used  Substance Use Topics  . Alcohol use: No  . Drug use: No     Colonoscopy:  PAP:  Bone density:  Lipid panel:  Allergies  Allergen Reactions  . Shrimp [Shellfish Allergy] Nausea And Vomiting    Current Outpatient Medications  Medication Sig Dispense Refill  . albuterol (PROVENTIL HFA;VENTOLIN HFA) 108 (90 BASE) MCG/ACT inhaler Inhale 2 puffs into the lungs every 6 (six) hours as needed for wheezing or shortness of breath.    Marland Kitchen albuterol (PROVENTIL) (2.5 MG/3ML) 0.083% nebulizer solution Take 2.5 mg every 6 (six) hours as needed by nebulization for wheezing or shortness of breath.    Marland Kitchen  beclomethasone (QVAR) 80 MCG/ACT inhaler Inhale 1 puff into the lungs 2 (two) times daily.    Marland Kitchen glipiZIDE (GLUCOTROL XL) 10 MG 24 hr tablet Take 10 mg by mouth daily with breakfast.    . ibuprofen (ADVIL,MOTRIN) 200 MG tablet Take 400-800 mg every 8 (eight) hours as needed by mouth (for pain.).    Marland Kitchen lisinopril-hydrochlorothiazide (PRINZIDE,ZESTORETIC) 20-25 MG tablet Take 1 tablet by mouth daily.    . metFORMIN (GLUCOPHAGE) 1000 MG tablet Take 1,000 mg  by mouth 2 (two) times daily.    Marland Kitchen omeprazole (PRILOSEC) 20 MG capsule TAKE 1 CAPSULE(20 MG) BY MOUTH DAILY 30 capsule 2  . umeclidinium-vilanterol (ANORO ELLIPTA) 62.5-25 MCG/INH AEPB Inhale 1 puff into the lungs daily. 60 each 0   No current facility-administered medications for this visit.     OBJECTIVE: Vitals:   06/04/18 1359  BP: (!) 147/92  Pulse: 99  Temp: 97.6 F (36.4 C)  SpO2: 94%     Body mass index is 33.83 kg/m.    ECOG FS:0 - Asymptomatic  General: Well-developed, well-nourished, no acute distress. Eyes: Pink conjunctiva, anicteric sclera. HEENT: Normocephalic, moist mucous membranes. Lungs: Clear to auscultation bilaterally. Heart: Regular rate and rhythm. No rubs, murmurs, or gallops. Abdomen: Soft, nontender, nondistended. No organomegaly noted, normoactive bowel sounds. Musculoskeletal: No edema, cyanosis, or clubbing. Neuro: Alert, answering all questions appropriately. Cranial nerves grossly intact. Skin: No rashes or petechiae noted. Psych: Normal affect.  LAB RESULTS:  Lab Results  Component Value Date   NA 138 06/04/2018   K 3.7 06/04/2018   CL 101 06/04/2018   CO2 26 06/04/2018   GLUCOSE 172 (H) 06/04/2018   BUN 16 06/04/2018   CREATININE 0.85 06/04/2018   CALCIUM 8.9 06/04/2018   PROT 6.8 06/04/2018   ALBUMIN 3.9 06/04/2018   AST 19 06/04/2018   ALT 22 06/04/2018   ALKPHOS 93 06/04/2018   BILITOT 0.4 06/04/2018   GFRNONAA >60 06/04/2018   GFRAA >60 06/04/2018    Lab Results  Component Value Date   WBC 9.0 06/04/2018   NEUTROABS 6.5 06/04/2018   HGB 15.1 06/04/2018   HCT 46.0 06/04/2018   MCV 86.3 06/04/2018   PLT 283 06/04/2018     STUDIES: No results found.  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  PLAN:    1. 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  Patient was also discussed at cancer conference.  MRI of the brain is negative.   CT scan results from January 26, 2018 reviewed independently with no new or progressive malignancy noted.  Patient initiated his maintenance treatment on September 11, 2017.  Continue treatment for a total of 12 months completing in March 2020.  Proceed with cycle 20 of maintenance durvalumab today.  Patient does not have a port.  Return to clinic in 2 weeks for treatment only and then in 4 weeks for further evaluation and consideration of cycle 22.        2.  Hyperglycemia: Chronic and unchanged.  Patient's glucose was 172 today.  He is not receiving steroids as a premedication.  Continue follow-up and treatment with primary care. 3.  Indigestion: Patient does not complain of this today.  Continue omeprazole as prescribed. 4.  Pain: Patient does not complain of this today.  Continue tramadol as needed. 5.  Cough/congestion: Resolved. 6.  Hypertension: Chronic.  Patient's blood pressure is mildly elevated today.  Continue current treatment as prescribed.  Follow-up with primary care for further evaluation. 7.  Hyponatremia: Resolved.  Patient expressed understanding and was in agreement with this plan. He also understands that He can call clinic at any time with any questions, concerns, or complaints.   Cancer Staging Squamous cell lung cancer, left [facility]) Staging form: Lung, AJCC 8th Edition - Clinical stage from 05/16/2017: Stage IIIB (cT3, cN2, cM0) - Signed by Lloyd Huger, MD on 05/16/2017   Lloyd Huger, MD   06/06/2018 8:05 AM

## 2018-06-04 ENCOUNTER — Inpatient Hospital Stay: Payer: BLUE CROSS/BLUE SHIELD

## 2018-06-04 ENCOUNTER — Other Ambulatory Visit: Payer: Self-pay

## 2018-06-04 ENCOUNTER — Inpatient Hospital Stay (HOSPITAL_BASED_OUTPATIENT_CLINIC_OR_DEPARTMENT_OTHER): Payer: BLUE CROSS/BLUE SHIELD | Admitting: Oncology

## 2018-06-04 VITALS — BP 147/92 | HR 99 | Temp 97.6°F | Wt 225.8 lb

## 2018-06-04 DIAGNOSIS — Z5112 Encounter for antineoplastic immunotherapy: Secondary | ICD-10-CM | POA: Diagnosis not present

## 2018-06-04 DIAGNOSIS — Z79899 Other long term (current) drug therapy: Secondary | ICD-10-CM

## 2018-06-04 DIAGNOSIS — K30 Functional dyspepsia: Secondary | ICD-10-CM

## 2018-06-04 DIAGNOSIS — Z87891 Personal history of nicotine dependence: Secondary | ICD-10-CM

## 2018-06-04 DIAGNOSIS — C3492 Malignant neoplasm of unspecified part of left bronchus or lung: Secondary | ICD-10-CM

## 2018-06-04 DIAGNOSIS — E119 Type 2 diabetes mellitus without complications: Secondary | ICD-10-CM

## 2018-06-04 DIAGNOSIS — E1165 Type 2 diabetes mellitus with hyperglycemia: Secondary | ICD-10-CM | POA: Diagnosis not present

## 2018-06-04 DIAGNOSIS — I10 Essential (primary) hypertension: Secondary | ICD-10-CM

## 2018-06-04 DIAGNOSIS — Z923 Personal history of irradiation: Secondary | ICD-10-CM

## 2018-06-04 DIAGNOSIS — Z7984 Long term (current) use of oral hypoglycemic drugs: Secondary | ICD-10-CM

## 2018-06-04 DIAGNOSIS — Z9221 Personal history of antineoplastic chemotherapy: Secondary | ICD-10-CM

## 2018-06-04 DIAGNOSIS — J449 Chronic obstructive pulmonary disease, unspecified: Secondary | ICD-10-CM

## 2018-06-04 LAB — COMPREHENSIVE METABOLIC PANEL
ALBUMIN: 3.9 g/dL (ref 3.5–5.0)
ALT: 22 U/L (ref 0–44)
AST: 19 U/L (ref 15–41)
Alkaline Phosphatase: 93 U/L (ref 38–126)
Anion gap: 11 (ref 5–15)
BUN: 16 mg/dL (ref 6–20)
CO2: 26 mmol/L (ref 22–32)
Calcium: 8.9 mg/dL (ref 8.9–10.3)
Chloride: 101 mmol/L (ref 98–111)
Creatinine, Ser: 0.85 mg/dL (ref 0.61–1.24)
GFR calc Af Amer: >60 mL/min (ref >60)
GFR calc non Af Amer: >60 mL/min (ref >60)
Glucose, Bld: 172 mg/dL — ABNORMAL HIGH (ref 70–99)
Potassium: 3.7 mmol/L (ref 3.5–5.1)
Sodium: 138 mmol/L (ref 135–145)
Total Bilirubin: 0.4 mg/dL (ref 0.3–1.2)
Total Protein: 6.8 g/dL (ref 6.5–8.1)

## 2018-06-04 LAB — CBC WITH DIFFERENTIAL/PLATELET
Abs Immature Granulocytes: 0.08 10*3/uL — ABNORMAL HIGH (ref 0.00–0.07)
BASOS PCT: 1 %
Basophils Absolute: 0.1 10*3/uL (ref 0.0–0.1)
Eosinophils Absolute: 0.3 10*3/uL (ref 0.0–0.5)
Eosinophils Relative: 3 %
HCT: 46 % (ref 39.0–52.0)
Hemoglobin: 15.1 g/dL (ref 13.0–17.0)
Immature Granulocytes: 1 %
Lymphocytes Relative: 14 %
Lymphs Abs: 1.2 10*3/uL (ref 0.7–4.0)
MCH: 28.3 pg (ref 26.0–34.0)
MCHC: 32.8 g/dL (ref 30.0–36.0)
MCV: 86.3 fL (ref 80.0–100.0)
Monocytes Absolute: 0.8 10*3/uL (ref 0.1–1.0)
Monocytes Relative: 8 %
Neutro Abs: 6.5 10*3/uL (ref 1.7–7.7)
Neutrophils Relative %: 73 %
PLATELETS: 283 10*3/uL (ref 150–400)
RBC: 5.33 MIL/uL (ref 4.22–5.81)
RDW: 12.6 % (ref 11.5–15.5)
WBC: 9 10*3/uL (ref 4.0–10.5)
nRBC: 0 % (ref 0.0–0.2)

## 2018-06-04 MED ORDER — SODIUM CHLORIDE 0.9 % IV SOLN
1000.0000 mg | Freq: Once | INTRAVENOUS | Status: AC
  Administered 2018-06-04: 1000 mg via INTRAVENOUS
  Filled 2018-06-04: qty 20

## 2018-06-04 MED ORDER — SODIUM CHLORIDE 0.9 % IV SOLN
Freq: Once | INTRAVENOUS | Status: AC
  Administered 2018-06-04: 15:00:00 via INTRAVENOUS
  Filled 2018-06-04: qty 250

## 2018-06-04 NOTE — Progress Notes (Signed)
Patient is here today to follow up on his squamous cell lung cancer, left. Patient stated that he had been doing well, except SOB on exertion and fatigue.

## 2018-06-18 ENCOUNTER — Ambulatory Visit: Payer: BLUE CROSS/BLUE SHIELD | Admitting: Radiation Oncology

## 2018-06-18 ENCOUNTER — Inpatient Hospital Stay: Payer: BLUE CROSS/BLUE SHIELD | Attending: Oncology

## 2018-06-18 VITALS — BP 162/91 | HR 90 | Temp 97.7°F | Resp 20 | Wt 227.0 lb

## 2018-06-18 DIAGNOSIS — Z7984 Long term (current) use of oral hypoglycemic drugs: Secondary | ICD-10-CM | POA: Insufficient documentation

## 2018-06-18 DIAGNOSIS — I10 Essential (primary) hypertension: Secondary | ICD-10-CM | POA: Diagnosis not present

## 2018-06-18 DIAGNOSIS — J449 Chronic obstructive pulmonary disease, unspecified: Secondary | ICD-10-CM | POA: Diagnosis not present

## 2018-06-18 DIAGNOSIS — Z87891 Personal history of nicotine dependence: Secondary | ICD-10-CM | POA: Insufficient documentation

## 2018-06-18 DIAGNOSIS — E1165 Type 2 diabetes mellitus with hyperglycemia: Secondary | ICD-10-CM | POA: Diagnosis not present

## 2018-06-18 DIAGNOSIS — Z5112 Encounter for antineoplastic immunotherapy: Secondary | ICD-10-CM | POA: Insufficient documentation

## 2018-06-18 DIAGNOSIS — Z9221 Personal history of antineoplastic chemotherapy: Secondary | ICD-10-CM | POA: Insufficient documentation

## 2018-06-18 DIAGNOSIS — K30 Functional dyspepsia: Secondary | ICD-10-CM | POA: Insufficient documentation

## 2018-06-18 DIAGNOSIS — Z923 Personal history of irradiation: Secondary | ICD-10-CM | POA: Diagnosis not present

## 2018-06-18 DIAGNOSIS — C3492 Malignant neoplasm of unspecified part of left bronchus or lung: Secondary | ICD-10-CM | POA: Insufficient documentation

## 2018-06-18 MED ORDER — SODIUM CHLORIDE 0.9 % IV SOLN
1000.0000 mg | Freq: Once | INTRAVENOUS | Status: AC
  Administered 2018-06-18: 1000 mg via INTRAVENOUS
  Filled 2018-06-18: qty 20

## 2018-06-18 MED ORDER — SODIUM CHLORIDE 0.9 % IV SOLN
Freq: Once | INTRAVENOUS | Status: AC
  Administered 2018-06-18: 14:00:00 via INTRAVENOUS
  Filled 2018-06-18: qty 250

## 2018-06-18 NOTE — Progress Notes (Signed)
HR 108.  Ok to proceed per MD

## 2018-06-18 NOTE — Progress Notes (Signed)
Per MD, Dr. Grayland Ormond, order: no lab work needed prior to treatment today. Proceed with Imfinzi treatment at this time.

## 2018-06-23 ENCOUNTER — Other Ambulatory Visit: Payer: Self-pay | Admitting: Internal Medicine

## 2018-06-25 ENCOUNTER — Other Ambulatory Visit: Payer: Self-pay | Admitting: Internal Medicine

## 2018-06-26 ENCOUNTER — Ambulatory Visit
Admission: RE | Admit: 2018-06-26 | Discharge: 2018-06-26 | Disposition: A | Discharge status: Home / Self Care | Payer: BLUE CROSS/BLUE SHIELD | Source: Ambulatory Visit | Attending: Radiation Oncology | Admitting: Radiation Oncology

## 2018-06-26 ENCOUNTER — Other Ambulatory Visit: Payer: Self-pay

## 2018-06-26 ENCOUNTER — Encounter: Payer: Self-pay | Admitting: Radiation Oncology

## 2018-06-26 VITALS — BP 196/101 | HR 99 | Temp 97.5°F | Resp 22 | Wt 226.5 lb

## 2018-06-26 DIAGNOSIS — F17210 Nicotine dependence, cigarettes, uncomplicated: Secondary | ICD-10-CM | POA: Insufficient documentation

## 2018-06-26 DIAGNOSIS — C3491 Malignant neoplasm of unspecified part of right bronchus or lung: Secondary | ICD-10-CM | POA: Insufficient documentation

## 2018-06-26 DIAGNOSIS — Z923 Personal history of irradiation: Secondary | ICD-10-CM | POA: Insufficient documentation

## 2018-06-26 DIAGNOSIS — C3492 Malignant neoplasm of unspecified part of left bronchus or lung: Secondary | ICD-10-CM

## 2018-06-26 NOTE — Progress Notes (Signed)
Radiation Oncology Follow up Note  Name: Gabriel Mendez   Date:   06/26/2018 MRN:  [id number] DOB: 1961-01-07    This 58 y.o. 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  REFERRING PROVIDER: Arliss Journey*  HPI: Gabriel Mendez is a 58 year old male now out 1 year having completed concurrent chemoradiation to his right lung for stage IIIB squamous cell carcinoma. Seen today in routine follow-up he is doing well. He specifically denies dysphagia hemoptysis or chest tightness he continues to smoke does have a mild productive cough sometimes with some great tissue present..he had a repeat CT scan back in August showing little significant change since March 2019 no new or progressive lesions to suggest malignancy. He is currently onmaintenance durvalumab   COMPLICATIONS OF TREATMENT: none  FOLLOW UP COMPLIANCE: keeps appointments   PHYSICAL EXAM:  BP (!) 196/101 (BP Location: Left Arm, Patient Position: Sitting)   Pulse 99   Temp (!) 97.5 F (36.4 C) (Tympanic)   Resp (!) 22   Wt 226 lb 8.4 oz (102.7 kg)   BMI 33.94 kg/m  Well-developed well-nourished patient in NAD. HEENT reveals PERLA, EOMI, discs not visualized.  Oral cavity is clear. No oral mucosal lesions are identified. Neck is clear without evidence of cervical or supraclavicular adenopathy. Lungs are clear to A&P. Cardiac examination is essentially unremarkable with regular rate and rhythm without murmur rub or thrill. Abdomen is benign with no organomegaly or masses noted. Motor sensory and DTR levels are equal and symmetric in the upper and lower extremities. Cranial nerves II through XII are grossly intact. Proprioception is intact. No peripheral adenopathy or edema is identified. No motor or sensory levels are noted. Crude visual fields are within normal range.  RADIOLOGY RESULTS: CT scan reviewed and compatible  with the above-stated findings  PLAN: present time patient is doing well he continues on maintenance durvalumab which she is tolerating well. I've offered again smoking cessation although he states he has cut down considerably. I've asked to see him back in 6 months for follow-up. Patient is to call sooner with any concerns he continues close follow-up care and treatment with medical oncology.  I would like to take this opportunity to thank you for allowing me to participate in the care of your patient.[name], MD

## 2018-06-27 NOTE — Progress Notes (Signed)
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  INTERVAL HISTORY: Patient returns to clinic today for further evaluation and consideration of cycle 22 of maintenance durvalumab.  He continues to feel well and remains asymptomatic.  He is tolerating his treatments without significant side effects.  He continues to be active and work full-time. He has no neurologic complaints. He has a good appetite and denies weight loss.  He denies any chest pain, cough, hemoptysis, or shortness of breath.  He has no nausea, vomiting, constipation, or diarrhea.  He has no urinary complaints.  Patient feels that his baseline offers no specific complaints today.  REVIEW OF SYSTEMS:   Review of Systems  Constitutional: Negative.  Negative for fever and weight loss.  HENT: Negative.  Negative for congestion.   Respiratory: Negative.  Negative for cough, hemoptysis and shortness of breath.   Cardiovascular: Negative.  Negative for chest pain and leg swelling.  Gastrointestinal: Negative.  Negative for abdominal pain and heartburn.  Genitourinary: Negative.  Negative for dysuria.  Musculoskeletal: Negative.  Negative for joint pain.  Skin: Negative.  Negative for rash.  Neurological: Negative.  Negative for sensory change, focal weakness and weakness.  Psychiatric/Behavioral: Negative.  The patient is not nervous/anxious.     As per HPI. Otherwise, a complete review of systems is negative.  PAST MEDICAL HISTORY: Past Medical History:  Diagnosis Date  . COPD (chronic obstructive pulmonary disease) ([place])   . Diabetes mellitus without complication ([place])   . Hypertension   . Lung cancer ([place]) 05/2017   Hx Chemo + rad  tx's.  . Pneumonia     PAST SURGICAL HISTORY: Past Surgical History:  Procedure Laterality Date  . ENDOBRONCHIAL ULTRASOUND N/A 05/12/2017   Procedure: ENDOBRONCHIAL ULTRASOUND;  Surgeon: Laverle Hobby, MD;  Location: [facility] ORS;  Service: Pulmonary;  Laterality: N/A;  . FRACTURE SURGERY Left    ANKLE X 2  . TONSILLECTOMY      FAMILY HISTORY: Family History  Problem Relation Age of Onset  . Stroke Mother   . Diabetes Mother   . Hypertension Mother   . Diabetes Father   . Hypertension Father   . Diabetes Sister   . Diabetes Brother   . Heart attack Paternal Uncle   . Stroke Maternal Grandmother     ADVANCED DIRECTIVES (Y/N):  N  HEALTH MAINTENANCE: Social History   Tobacco Use  . Smoking status: Former Smoker    Packs/day: 1.50    Last attempt to quit: 07/31/2017    Years since quitting: 0.9  . Smokeless tobacco: Never Used  Substance Use Topics  . Alcohol use: No  . Drug use: No     Colonoscopy:  PAP:  Bone density:  Lipid panel:  Allergies  Allergen Reactions  . Shrimp [Shellfish Allergy] Nausea And Vomiting    Current Outpatient Medications  Medication Sig Dispense Refill  . albuterol (PROVENTIL HFA;VENTOLIN HFA) 108 (90 BASE) MCG/ACT inhaler Inhale 2 puffs into the lungs every 6 (six) hours as needed for wheezing or shortness of breath.    Marland Kitchen albuterol (PROVENTIL) (2.5 MG/3ML) 0.083% nebulizer solution Take 2.5 mg every 6 (six) hours as needed by nebulization for wheezing or shortness of breath.    Jearl Klinefelter ELLIPTA 62.5-25  MCG/INH AEPB INHALE 1 PUFF INTO THE LUNGS DAILY IN ADDITION TO QVAR 60 each 2  . beclomethasone (QVAR) 80 MCG/ACT inhaler Inhale 1 puff into the lungs 2 (two) times daily.    Marland Kitchen glipiZIDE (GLUCOTROL XL) 10 MG 24 hr tablet Take 10 mg by mouth daily with breakfast.    . ibuprofen (ADVIL,MOTRIN) 200 MG tablet Take 400-800 mg every 8 (eight) hours as needed by mouth (for pain.).    Marland Kitchen lisinopril-hydrochlorothiazide (PRINZIDE,ZESTORETIC)  20-25 MG tablet Take 1 tablet by mouth daily.    . metFORMIN (GLUCOPHAGE) 1000 MG tablet Take 1,000 mg by mouth 2 (two) times daily.    Marland Kitchen omeprazole (PRILOSEC) 20 MG capsule TAKE 1 CAPSULE(20 MG) BY MOUTH DAILY 30 capsule 2   No current facility-administered medications for this visit.     OBJECTIVE: Vitals:   07/02/18 1344  BP: (!) 168/93  Pulse: 90  Temp: (!) 97.3 F (36.3 C)  SpO2: 95%     Body mass index is 34.18 kg/m.    ECOG FS:0 - Asymptomatic  General: Well-developed, well-nourished, no acute distress. Eyes: Pink conjunctiva, anicteric sclera. HEENT: Normocephalic, moist mucous membranes. Lungs: Clear to auscultation bilaterally. Heart: Regular rate and rhythm. No rubs, murmurs, or gallops. Abdomen: Soft, nontender, nondistended. No organomegaly noted, normoactive bowel sounds. Musculoskeletal: No edema, cyanosis, or clubbing. Neuro: Alert, answering all questions appropriately. Cranial nerves grossly intact. Skin: No rashes or petechiae noted. Psych: Normal affect.  LAB RESULTS:  Lab Results  Component Value Date   NA 136 07/02/2018   K 4.0 07/02/2018   CL 99 07/02/2018   CO2 27 07/02/2018   GLUCOSE 186 (H) 07/02/2018   BUN 14 07/02/2018   CREATININE 0.71 07/02/2018   CALCIUM 9.1 07/02/2018   PROT 7.0 07/02/2018   ALBUMIN 3.9 07/02/2018   AST 18 07/02/2018   ALT 22 07/02/2018   ALKPHOS 93 07/02/2018   BILITOT 0.5 07/02/2018   GFRNONAA >60 07/02/2018   GFRAA >60 07/02/2018    Lab Results  Component Value Date   WBC 9.0 07/02/2018   NEUTROABS 6.5 07/02/2018   HGB 15.3 07/02/2018   HCT 47.0 07/02/2018   MCV 87.4 07/02/2018   PLT 254 07/02/2018     STUDIES: No results found.  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  PLAN:    1. 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  Patient was also discussed at cancer conference.  MRI of the brain is negative.  CT  scan results from January 26, 2018 reviewed independently with no new or progressive malignancy noted.  Patient initiated his maintenance treatment on September 11, 2017.  Continue treatment for a total of 12 months completing in March 2020.  Proceed with cycle 22 of maintenance durvalumab today.  Return to clinic in 2 weeks for treatment only and then in 4 weeks for further evaluation and consideration of cycle 24.       2.  Hyperglycemia: Chronic and unchanged.  Patient's glucose was 186 today.  He is not receiving steroids as a premedication.  Continue follow-up and treatment with primary care. 3.  Indigestion: Patient does not complain of this today.  Continue omeprazole as prescribed. 4.  Pain: Patient does not complain of this today.  Continue tramadol as needed. 5.  Hypertension: Chronic.  Patient's blood pressure remains moderately elevated today.  Continue current treatment as prescribed.  Follow-up with primary care for further evaluation.   Patient  expressed understanding and was in agreement with this plan. He also understands that He can call clinic at any time with any questions, concerns, or complaints.   Cancer Staging Squamous cell lung cancer, left [facility]) Staging form: Lung, AJCC 8th Edition - Clinical stage from 05/16/2017: Stage IIIB (cT3, cN2, cM0) - Signed by Lloyd Huger, MD on 05/16/2017   Lloyd Huger, MD   07/03/2018 9:47 AM

## 2018-07-02 ENCOUNTER — Other Ambulatory Visit: Payer: Self-pay

## 2018-07-02 ENCOUNTER — Inpatient Hospital Stay (HOSPITAL_BASED_OUTPATIENT_CLINIC_OR_DEPARTMENT_OTHER): Payer: BLUE CROSS/BLUE SHIELD | Admitting: Oncology

## 2018-07-02 ENCOUNTER — Inpatient Hospital Stay: Payer: BLUE CROSS/BLUE SHIELD

## 2018-07-02 VITALS — BP 168/93 | HR 90 | Temp 97.3°F | Ht 68.5 in | Wt 228.1 lb

## 2018-07-02 DIAGNOSIS — Z7984 Long term (current) use of oral hypoglycemic drugs: Secondary | ICD-10-CM

## 2018-07-02 DIAGNOSIS — Z923 Personal history of irradiation: Secondary | ICD-10-CM

## 2018-07-02 DIAGNOSIS — Z9221 Personal history of antineoplastic chemotherapy: Secondary | ICD-10-CM

## 2018-07-02 DIAGNOSIS — Z87891 Personal history of nicotine dependence: Secondary | ICD-10-CM

## 2018-07-02 DIAGNOSIS — E1165 Type 2 diabetes mellitus with hyperglycemia: Secondary | ICD-10-CM | POA: Diagnosis not present

## 2018-07-02 DIAGNOSIS — C3492 Malignant neoplasm of unspecified part of left bronchus or lung: Secondary | ICD-10-CM | POA: Diagnosis not present

## 2018-07-02 DIAGNOSIS — J449 Chronic obstructive pulmonary disease, unspecified: Secondary | ICD-10-CM

## 2018-07-02 DIAGNOSIS — K30 Functional dyspepsia: Secondary | ICD-10-CM

## 2018-07-02 DIAGNOSIS — Z5112 Encounter for antineoplastic immunotherapy: Secondary | ICD-10-CM | POA: Diagnosis not present

## 2018-07-02 DIAGNOSIS — I10 Essential (primary) hypertension: Secondary | ICD-10-CM

## 2018-07-02 LAB — CBC WITH DIFFERENTIAL/PLATELET
Abs Immature Granulocytes: 0.16 10*3/uL — ABNORMAL HIGH (ref 0.00–0.07)
Basophils Absolute: 0.1 10*3/uL (ref 0.0–0.1)
Basophils Relative: 1 %
Eosinophils Absolute: 0.3 10*3/uL (ref 0.0–0.5)
Eosinophils Relative: 4 %
HEMATOCRIT: 47 % (ref 39.0–52.0)
HEMOGLOBIN: 15.3 g/dL (ref 13.0–17.0)
Immature Granulocytes: 2 %
LYMPHS PCT: 13 %
Lymphs Abs: 1.1 10*3/uL (ref 0.7–4.0)
MCH: 28.4 pg (ref 26.0–34.0)
MCHC: 32.6 g/dL (ref 30.0–36.0)
MCV: 87.4 fL (ref 80.0–100.0)
Monocytes Absolute: 0.7 10*3/uL (ref 0.1–1.0)
Monocytes Relative: 8 %
Neutro Abs: 6.5 10*3/uL (ref 1.7–7.7)
Neutrophils Relative %: 72 %
Platelets: 254 10*3/uL (ref 150–400)
RBC: 5.38 MIL/uL (ref 4.22–5.81)
RDW: 12.5 % (ref 11.5–15.5)
WBC: 9 10*3/uL (ref 4.0–10.5)
nRBC: 0 % (ref 0.0–0.2)

## 2018-07-02 LAB — COMPREHENSIVE METABOLIC PANEL
ALBUMIN: 3.9 g/dL (ref 3.5–5.0)
ALT: 22 U/L (ref 0–44)
AST: 18 U/L (ref 15–41)
Alkaline Phosphatase: 93 U/L (ref 38–126)
Anion gap: 10 (ref 5–15)
BUN: 14 mg/dL (ref 6–20)
CO2: 27 mmol/L (ref 22–32)
Calcium: 9.1 mg/dL (ref 8.9–10.3)
Chloride: 99 mmol/L (ref 98–111)
Creatinine, Ser: 0.71 mg/dL (ref 0.61–1.24)
GFR calc Af Amer: >60 mL/min (ref >60)
GFR calc non Af Amer: >60 mL/min (ref >60)
GLUCOSE: 186 mg/dL — AB (ref 70–99)
Potassium: 4 mmol/L (ref 3.5–5.1)
Sodium: 136 mmol/L (ref 135–145)
Total Bilirubin: 0.5 mg/dL (ref 0.3–1.2)
Total Protein: 7 g/dL (ref 6.5–8.1)

## 2018-07-02 MED ORDER — SODIUM CHLORIDE 0.9 % IV SOLN
1000.0000 mg | Freq: Once | INTRAVENOUS | Status: AC
  Administered 2018-07-02: 1000 mg via INTRAVENOUS
  Filled 2018-07-02: qty 20

## 2018-07-02 MED ORDER — SODIUM CHLORIDE 0.9 % IV SOLN
Freq: Once | INTRAVENOUS | Status: AC
  Administered 2018-07-02: 15:00:00 via INTRAVENOUS
  Filled 2018-07-02: qty 250

## 2018-07-02 NOTE — Progress Notes (Signed)
Patient is here today to follow up on his squamous cell lung cancer, left. Patient stated that he feels fatigued all the time. Sometimes he feels SOB on exertion.

## 2018-07-16 ENCOUNTER — Other Ambulatory Visit: Payer: Self-pay | Admitting: *Deleted

## 2018-07-16 ENCOUNTER — Inpatient Hospital Stay: Payer: BLUE CROSS/BLUE SHIELD

## 2018-07-16 VITALS — BP 157/88 | HR 89 | Temp 96.6°F | Resp 18

## 2018-07-16 DIAGNOSIS — C3492 Malignant neoplasm of unspecified part of left bronchus or lung: Secondary | ICD-10-CM | POA: Diagnosis not present

## 2018-07-16 MED ORDER — SODIUM CHLORIDE 0.9 % IV SOLN
Freq: Once | INTRAVENOUS | Status: AC
  Administered 2018-07-16: 14:00:00 via INTRAVENOUS
  Filled 2018-07-16: qty 250

## 2018-07-16 MED ORDER — SODIUM CHLORIDE 0.9 % IV SOLN
1000.0000 mg | Freq: Once | INTRAVENOUS | Status: AC
  Administered 2018-07-16: 1000 mg via INTRAVENOUS
  Filled 2018-07-16: qty 20

## 2018-07-24 ENCOUNTER — Other Ambulatory Visit: Payer: Self-pay | Admitting: Oncology

## 2018-07-24 NOTE — Progress Notes (Signed)
[place]  [phone] [phone]) [phone]  ID: Gabriel MUNFORD OB: 1961/01/22  MR#: [id number]  IOE#:[id number]  Patient Care Team: Gunnar Bulla as PCP - General (Physician Assistant) Telford Nab, RN as Registered Nurse  CHIEF COMPLAINT: Stage IIIb squamous cell carcinoma of the left lung.  INTERVAL HISTORY: Patient returns to clinic today for further evaluation and consideration of cycle 24 of maintenance durvalumab.  He continues to feel well and remains asymptomatic.  He is tolerating his treatments without significant side effects.  He continues to be active and work full-time. He has no neurologic complaints. He has a good appetite and denies weight loss.  He denies any chest pain, cough, hemoptysis, or shortness of breath.  He has no nausea, vomiting, constipation, or diarrhea.  He has no urinary complaints.  Patient feels at his baseline offers no specific complaints today.  REVIEW OF SYSTEMS:   Review of Systems  Constitutional: Negative.  Negative for fever and weight loss.  HENT: Negative.  Negative for congestion.   Respiratory: Negative.  Negative for cough, hemoptysis and shortness of breath.   Cardiovascular: Negative.  Negative for chest pain and leg swelling.  Gastrointestinal: Negative.  Negative for abdominal pain and heartburn.  Genitourinary: Negative.  Negative for dysuria.  Musculoskeletal: Negative.  Negative for joint pain.  Skin: Negative.  Negative for rash.  Neurological: Negative.  Negative for sensory change, focal weakness and weakness.  Psychiatric/Behavioral: Negative.  The patient is not nervous/anxious.     As per HPI. Otherwise, a complete review of systems is negative.  PAST MEDICAL HISTORY: Past Medical History:  Diagnosis Date  . COPD (chronic obstructive pulmonary disease) ([place])   . Diabetes mellitus without complication ([place])   . Hypertension   . Lung cancer ([place]) 05/2017   Hx Chemo + rad tx's.   . Pneumonia     PAST SURGICAL HISTORY: Past Surgical History:  Procedure Laterality Date  . ENDOBRONCHIAL ULTRASOUND N/A 05/12/2017   Procedure: ENDOBRONCHIAL ULTRASOUND;  Surgeon: Laverle Hobby, MD;  Location: [facility] ORS;  Service: Pulmonary;  Laterality: N/A;  . FRACTURE SURGERY Left    ANKLE X 2  . TONSILLECTOMY      FAMILY HISTORY: Family History  Problem Relation Age of Onset  . Stroke Mother   . Diabetes Mother   . Hypertension Mother   . Diabetes Father   . Hypertension Father   . Diabetes Sister   . Diabetes Brother   . Heart attack Paternal Uncle   . Stroke Maternal Grandmother     ADVANCED DIRECTIVES (Y/N):  N  HEALTH MAINTENANCE: Social History   Tobacco Use  . Smoking status: Former Smoker    Packs/day: 1.50    Last attempt to quit: 07/31/2017    Years since quitting: 1.0  . Smokeless tobacco: Never Used  Substance Use Topics  . Alcohol use: No  . Drug use: No     Colonoscopy:  PAP:  Bone density:  Lipid panel:  Allergies  Allergen Reactions  . Shrimp [Shellfish Allergy] Nausea And Vomiting    Current Outpatient Medications  Medication Sig Dispense Refill  . albuterol (PROVENTIL HFA;VENTOLIN HFA) 108 (90 BASE) MCG/ACT inhaler Inhale 2 puffs into the lungs every 6 (six) hours as needed for wheezing or shortness of breath.    Marland Kitchen albuterol (PROVENTIL) (2.5 MG/3ML) 0.083% nebulizer solution Take 2.5 mg every 6 (six) hours as needed by nebulization for wheezing or shortness of breath.    Jearl Klinefelter ELLIPTA 62.5-25  MCG/INH AEPB INHALE 1 PUFF INTO THE LUNGS DAILY IN ADDITION TO QVAR 60 each 2  . beclomethasone (QVAR) 80 MCG/ACT inhaler Inhale 1 puff into the lungs 2 (two) times daily.    Marland Kitchen glipiZIDE (GLUCOTROL XL) 10 MG 24 hr tablet Take 10 mg by mouth daily with breakfast.    . ibuprofen (ADVIL,MOTRIN) 200 MG tablet Take 400-800 mg every 8 (eight) hours as needed by mouth (for pain.).    Marland Kitchen lisinopril-hydrochlorothiazide (PRINZIDE,ZESTORETIC) 20-25  MG tablet Take 1 tablet by mouth daily.    . metFORMIN (GLUCOPHAGE) 1000 MG tablet Take 1,000 mg by mouth 2 (two) times daily.    Marland Kitchen omeprazole (PRILOSEC) 20 MG capsule TAKE 1 CAPSULE(20 MG) BY MOUTH DAILY 30 capsule 2  . ATROVENT HFA 17 MCG/ACT inhaler Inhale 1 puff into the lungs 1 day or 1 dose.    Marland Kitchen QVAR REDIHALER 80 MCG/ACT inhaler Inhale 2 puffs into the lungs 1 day or 1 dose.     No current facility-administered medications for this visit.     OBJECTIVE: Vitals:   07/30/18 1359  BP: (!) 176/111  Pulse: 94  Temp: 97.8 F (36.6 C)     Body mass index is 34.01 kg/m.    ECOG FS:0 - Asymptomatic  General: Well-developed, well-nourished, no acute distress. Eyes: Pink conjunctiva, anicteric sclera. HEENT: Normocephalic, moist mucous membranes. Lungs: Clear to auscultation bilaterally. Heart: Regular rate and rhythm. No rubs, murmurs, or gallops. Abdomen: Soft, nontender, nondistended. No organomegaly noted, normoactive bowel sounds. Musculoskeletal: No edema, cyanosis, or clubbing. Neuro: Alert, answering all questions appropriately. Cranial nerves grossly intact. Skin: No rashes or petechiae noted. Psych: Normal affect.  LAB RESULTS:  Lab Results  Component Value Date   NA 136 07/30/2018   K 3.7 07/30/2018   CL 99 07/30/2018   CO2 28 07/30/2018   GLUCOSE 158 (H) 07/30/2018   BUN 13 07/30/2018   CREATININE 0.79 07/30/2018   CALCIUM 9.0 07/30/2018   PROT 6.8 07/30/2018   ALBUMIN 3.9 07/30/2018   AST 18 07/30/2018   ALT 25 07/30/2018   ALKPHOS 82 07/30/2018   BILITOT 0.4 07/30/2018   GFRNONAA >60 07/30/2018   GFRAA >60 07/30/2018    Lab Results  Component Value Date   WBC 8.6 07/30/2018   NEUTROABS 6.4 07/30/2018   HGB 14.9 07/30/2018   HCT 44.7 07/30/2018   MCV 86.1 07/30/2018   PLT 258 07/30/2018     STUDIES: No results found.  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  PLAN:    1. Stage IIIb squamous cell carcinoma of the  left lung: Imaging and pathology results reviewed independently.  Patient was also discussed at cancer conference.  MRI of the brain is negative.  CT scan results from January 26, 2018 reviewed independently with no new or progressive malignancy noted.  Patient initiated his maintenance treatment on September 11, 2017.  Continue treatment for a total of 12 months completing in March 2020.  Proceed with cycle 24 of maintenance durvalumab today.  Return to clinic in 2 weeks for treatment only and then in 4 weeks for further evaluation and consideration of cycle 26 which will be his final treatment.  Will repeat imaging approximately 3 months after completion of treatment.   2.  Hyperglycemia: Chronic and unchanged.  Patient's glucose is 158 today.  He is not receiving steroids as a premedication.  Continue follow-up and treatment with primary care. 3.  Indigestion: Patient does not complain of this today.  Continue omeprazole as prescribed. 4.  Pain: Patient does not complain of this today.  Continue tramadol as needed. 5.  Hypertension: Chronic.  Patient's blood pressure is significantly elevated today.  Continue current treatment as prescribed.  Follow-up with primary care for further evaluation.   Patient expressed understanding and was in agreement with this plan. He also understands that He can call clinic at any time with any questions, concerns, or complaints.   Cancer Staging Squamous cell lung cancer, left [facility]) Staging form: Lung, AJCC 8th Edition - Clinical stage from 05/16/2017: Stage IIIB (cT3, cN2, cM0) - Signed by Lloyd Huger, MD on 05/16/2017   Lloyd Huger, MD   07/31/2018 12:24 PM

## 2018-07-28 ENCOUNTER — Other Ambulatory Visit: Payer: Self-pay | Admitting: *Deleted

## 2018-07-28 DIAGNOSIS — C3492 Malignant neoplasm of unspecified part of left bronchus or lung: Secondary | ICD-10-CM

## 2018-07-30 ENCOUNTER — Other Ambulatory Visit: Payer: Self-pay

## 2018-07-30 ENCOUNTER — Inpatient Hospital Stay: Payer: BLUE CROSS/BLUE SHIELD | Attending: Oncology

## 2018-07-30 ENCOUNTER — Inpatient Hospital Stay (HOSPITAL_BASED_OUTPATIENT_CLINIC_OR_DEPARTMENT_OTHER): Payer: BLUE CROSS/BLUE SHIELD | Admitting: Oncology

## 2018-07-30 ENCOUNTER — Inpatient Hospital Stay: Payer: BLUE CROSS/BLUE SHIELD

## 2018-07-30 VITALS — BP 176/111 | HR 94 | Temp 97.8°F | Ht 68.5 in | Wt 227.0 lb

## 2018-07-30 DIAGNOSIS — Z5112 Encounter for antineoplastic immunotherapy: Secondary | ICD-10-CM | POA: Diagnosis not present

## 2018-07-30 DIAGNOSIS — Z79899 Other long term (current) drug therapy: Secondary | ICD-10-CM | POA: Diagnosis not present

## 2018-07-30 DIAGNOSIS — Z7984 Long term (current) use of oral hypoglycemic drugs: Secondary | ICD-10-CM | POA: Diagnosis not present

## 2018-07-30 DIAGNOSIS — Z87891 Personal history of nicotine dependence: Secondary | ICD-10-CM | POA: Diagnosis not present

## 2018-07-30 DIAGNOSIS — I10 Essential (primary) hypertension: Secondary | ICD-10-CM

## 2018-07-30 DIAGNOSIS — J449 Chronic obstructive pulmonary disease, unspecified: Secondary | ICD-10-CM

## 2018-07-30 DIAGNOSIS — C3492 Malignant neoplasm of unspecified part of left bronchus or lung: Secondary | ICD-10-CM

## 2018-07-30 DIAGNOSIS — Z9221 Personal history of antineoplastic chemotherapy: Secondary | ICD-10-CM | POA: Insufficient documentation

## 2018-07-30 DIAGNOSIS — E1165 Type 2 diabetes mellitus with hyperglycemia: Secondary | ICD-10-CM | POA: Insufficient documentation

## 2018-07-30 DIAGNOSIS — Z923 Personal history of irradiation: Secondary | ICD-10-CM

## 2018-07-30 LAB — CBC WITH DIFFERENTIAL/PLATELET
Abs Immature Granulocytes: 0.1 10*3/uL — ABNORMAL HIGH (ref 0.00–0.07)
BASOS PCT: 1 %
Basophils Absolute: 0.1 10*3/uL (ref 0.0–0.1)
Eosinophils Absolute: 0.3 10*3/uL (ref 0.0–0.5)
Eosinophils Relative: 3 %
HCT: 44.7 % (ref 39.0–52.0)
Hemoglobin: 14.9 g/dL (ref 13.0–17.0)
Immature Granulocytes: 1 %
Lymphocytes Relative: 13 %
Lymphs Abs: 1.2 10*3/uL (ref 0.7–4.0)
MCH: 28.7 pg (ref 26.0–34.0)
MCHC: 33.3 g/dL (ref 30.0–36.0)
MCV: 86.1 fL (ref 80.0–100.0)
Monocytes Absolute: 0.6 10*3/uL (ref 0.1–1.0)
Monocytes Relative: 7 %
Neutro Abs: 6.4 10*3/uL (ref 1.7–7.7)
Neutrophils Relative %: 75 %
Platelets: 258 10*3/uL (ref 150–400)
RBC: 5.19 MIL/uL (ref 4.22–5.81)
RDW: 12.6 % (ref 11.5–15.5)
WBC: 8.6 10*3/uL (ref 4.0–10.5)
nRBC: 0 % (ref 0.0–0.2)

## 2018-07-30 LAB — COMPREHENSIVE METABOLIC PANEL
ALT: 25 U/L (ref 0–44)
AST: 18 U/L (ref 15–41)
Albumin: 3.9 g/dL (ref 3.5–5.0)
Alkaline Phosphatase: 82 U/L (ref 38–126)
Anion gap: 9 (ref 5–15)
BUN: 13 mg/dL (ref 6–20)
CO2: 28 mmol/L (ref 22–32)
Calcium: 9 mg/dL (ref 8.9–10.3)
Chloride: 99 mmol/L (ref 98–111)
Creatinine, Ser: 0.79 mg/dL (ref 0.61–1.24)
GFR calc non Af Amer: >60 mL/min (ref >60)
Glucose, Bld: 158 mg/dL — ABNORMAL HIGH (ref 70–99)
Potassium: 3.7 mmol/L (ref 3.5–5.1)
Sodium: 136 mmol/L (ref 135–145)
Total Bilirubin: 0.4 mg/dL (ref 0.3–1.2)
Total Protein: 6.8 g/dL (ref 6.5–8.1)

## 2018-07-30 MED ORDER — SODIUM CHLORIDE 0.9 % IV SOLN
Freq: Once | INTRAVENOUS | Status: AC
  Administered 2018-07-30: 15:00:00 via INTRAVENOUS
  Filled 2018-07-30: qty 250

## 2018-07-30 MED ORDER — SODIUM CHLORIDE 0.9 % IV SOLN
1000.0000 mg | Freq: Once | INTRAVENOUS | Status: AC
  Administered 2018-07-30: 1000 mg via INTRAVENOUS
  Filled 2018-07-30: qty 20

## 2018-07-30 NOTE — Progress Notes (Signed)
Patient is here today to follow up on his squamous cell lung cancer, left. Patient stated that he stays SOB all the time regardless using his inhalers.

## 2018-07-31 LAB — THYROID PANEL WITH TSH
FREE THYROXINE INDEX: 2.2 (ref 1.2–4.9)
T3 Uptake Ratio: 26 % (ref 24–39)
T4, Total: 8.6 ug/dL (ref 4.5–12.0)
TSH: 1.46 u[IU]/mL (ref 0.450–4.500)

## 2018-08-11 ENCOUNTER — Other Ambulatory Visit: Payer: Self-pay | Admitting: Oncology

## 2018-08-13 ENCOUNTER — Inpatient Hospital Stay: Payer: BLUE CROSS/BLUE SHIELD

## 2018-08-13 VITALS — BP 163/101 | HR 93 | Temp 96.0°F | Wt 229.0 lb

## 2018-08-13 DIAGNOSIS — C3492 Malignant neoplasm of unspecified part of left bronchus or lung: Secondary | ICD-10-CM | POA: Diagnosis not present

## 2018-08-13 MED ORDER — SODIUM CHLORIDE 0.9 % IV SOLN
1000.0000 mg | Freq: Once | INTRAVENOUS | Status: AC
  Administered 2018-08-13: 1000 mg via INTRAVENOUS
  Filled 2018-08-13: qty 20

## 2018-08-13 MED ORDER — SODIUM CHLORIDE 0.9 % IV SOLN
Freq: Once | INTRAVENOUS | Status: AC
  Administered 2018-08-13: 14:00:00 via INTRAVENOUS
  Filled 2018-08-13: qty 250

## 2018-08-27 ENCOUNTER — Inpatient Hospital Stay: Payer: BLUE CROSS/BLUE SHIELD

## 2018-08-27 ENCOUNTER — Inpatient Hospital Stay: Payer: BLUE CROSS/BLUE SHIELD | Attending: Oncology

## 2018-08-27 ENCOUNTER — Encounter: Payer: Self-pay | Admitting: Oncology

## 2018-08-27 ENCOUNTER — Inpatient Hospital Stay (HOSPITAL_BASED_OUTPATIENT_CLINIC_OR_DEPARTMENT_OTHER): Payer: BLUE CROSS/BLUE SHIELD | Admitting: Oncology

## 2018-08-27 ENCOUNTER — Other Ambulatory Visit: Payer: Self-pay

## 2018-08-27 VITALS — BP 146/87 | HR 105 | Temp 96.8°F | Ht 68.5 in | Wt 223.0 lb

## 2018-08-27 VITALS — BP 142/82 | HR 95 | Resp 20

## 2018-08-27 DIAGNOSIS — Z87891 Personal history of nicotine dependence: Secondary | ICD-10-CM | POA: Insufficient documentation

## 2018-08-27 DIAGNOSIS — Z923 Personal history of irradiation: Secondary | ICD-10-CM | POA: Diagnosis not present

## 2018-08-27 DIAGNOSIS — C3492 Malignant neoplasm of unspecified part of left bronchus or lung: Secondary | ICD-10-CM

## 2018-08-27 DIAGNOSIS — I10 Essential (primary) hypertension: Secondary | ICD-10-CM | POA: Diagnosis not present

## 2018-08-27 DIAGNOSIS — Z79899 Other long term (current) drug therapy: Secondary | ICD-10-CM | POA: Diagnosis not present

## 2018-08-27 DIAGNOSIS — J449 Chronic obstructive pulmonary disease, unspecified: Secondary | ICD-10-CM | POA: Diagnosis not present

## 2018-08-27 DIAGNOSIS — Z5112 Encounter for antineoplastic immunotherapy: Secondary | ICD-10-CM | POA: Diagnosis not present

## 2018-08-27 DIAGNOSIS — E1165 Type 2 diabetes mellitus with hyperglycemia: Secondary | ICD-10-CM | POA: Insufficient documentation

## 2018-08-27 DIAGNOSIS — Z9221 Personal history of antineoplastic chemotherapy: Secondary | ICD-10-CM | POA: Insufficient documentation

## 2018-08-27 DIAGNOSIS — Z7984 Long term (current) use of oral hypoglycemic drugs: Secondary | ICD-10-CM | POA: Insufficient documentation

## 2018-08-27 LAB — COMPREHENSIVE METABOLIC PANEL
ALK PHOS: 104 U/L (ref 38–126)
ALT: 29 U/L (ref 0–44)
AST: 25 U/L (ref 15–41)
Albumin: 4.2 g/dL (ref 3.5–5.0)
Anion gap: 11 (ref 5–15)
BUN: 13 mg/dL (ref 6–20)
CO2: 26 mmol/L (ref 22–32)
Calcium: 9.3 mg/dL (ref 8.9–10.3)
Chloride: 97 mmol/L — ABNORMAL LOW (ref 98–111)
Creatinine, Ser: 0.94 mg/dL (ref 0.61–1.24)
GFR calc Af Amer: >60 mL/min (ref >60)
Glucose, Bld: 247 mg/dL — ABNORMAL HIGH (ref 70–99)
Potassium: 3.9 mmol/L (ref 3.5–5.1)
Sodium: 134 mmol/L — ABNORMAL LOW (ref 135–145)
Total Bilirubin: 0.7 mg/dL (ref 0.3–1.2)
Total Protein: 7.7 g/dL (ref 6.5–8.1)

## 2018-08-27 LAB — CBC WITH DIFFERENTIAL/PLATELET
Abs Immature Granulocytes: 0.12 10*3/uL — ABNORMAL HIGH (ref 0.00–0.07)
Basophils Absolute: 0.1 10*3/uL (ref 0.0–0.1)
Basophils Relative: 1 %
EOS PCT: 4 %
Eosinophils Absolute: 0.4 10*3/uL (ref 0.0–0.5)
HCT: 50.5 % (ref 39.0–52.0)
Hemoglobin: 16.9 g/dL (ref 13.0–17.0)
Immature Granulocytes: 1 %
Lymphocytes Relative: 14 %
Lymphs Abs: 1.3 10*3/uL (ref 0.7–4.0)
MCH: 28.9 pg (ref 26.0–34.0)
MCHC: 33.5 g/dL (ref 30.0–36.0)
MCV: 86.5 fL (ref 80.0–100.0)
Monocytes Absolute: 0.7 10*3/uL (ref 0.1–1.0)
Monocytes Relative: 7 %
Neutro Abs: 7.2 10*3/uL (ref 1.7–7.7)
Neutrophils Relative %: 73 %
Platelets: 290 10*3/uL (ref 150–400)
RBC: 5.84 MIL/uL — ABNORMAL HIGH (ref 4.22–5.81)
RDW: 12.9 % (ref 11.5–15.5)
WBC: 9.9 10*3/uL (ref 4.0–10.5)
nRBC: 0 % (ref 0.0–0.2)

## 2018-08-27 MED ORDER — SODIUM CHLORIDE 0.9 % IV SOLN
1000.0000 mg | Freq: Once | INTRAVENOUS | Status: AC
  Administered 2018-08-27: 1000 mg via INTRAVENOUS
  Filled 2018-08-27: qty 20

## 2018-08-27 MED ORDER — SODIUM CHLORIDE 0.9 % IV SOLN
Freq: Once | INTRAVENOUS | Status: AC
  Administered 2018-08-27: 15:00:00 via INTRAVENOUS
  Filled 2018-08-27: qty 250

## 2018-08-27 NOTE — Progress Notes (Signed)
Patient here today for follow up.  Patient states no new concerns today but is asking when he could/should restart aspirin 81mg .

## 2018-08-29 NOTE — Progress Notes (Signed)
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  INTERVAL HISTORY: Patient returns to clinic today for further evaluation and consideration of cycle 26 and final treatment of maintenance durvalumab.  He continues to complain of fatigue, but otherwise feels well and is tolerating his treatments without significant side effects.  Despite this, he continues to be active and work full-time. He has no neurologic complaints. He has a good appetite and denies weight loss.  He denies any chest pain, cough, hemoptysis, or shortness of breath.  He has no nausea, vomiting, constipation, or diarrhea.  He has no urinary complaints.  Patient offers no further specific complaints today.  REVIEW OF SYSTEMS:   Review of Systems  Constitutional: Positive for malaise/fatigue. Negative for fever and weight loss.  HENT: Negative.  Negative for congestion.   Respiratory: Negative.  Negative for cough, hemoptysis and shortness of breath.   Cardiovascular: Negative.  Negative for chest pain and leg swelling.  Gastrointestinal: Negative.  Negative for abdominal pain and heartburn.  Genitourinary: Negative.  Negative for dysuria.  Musculoskeletal: Negative.  Negative for joint pain.  Skin: Negative.  Negative for rash.  Neurological: Negative.  Negative for dizziness, sensory change, focal weakness, weakness and headaches.  Psychiatric/Behavioral: Negative.  The patient is not nervous/anxious.     As per HPI. Otherwise, a complete review of systems is negative.  PAST MEDICAL HISTORY: Past Medical History:  Diagnosis Date  . COPD (chronic obstructive pulmonary disease) ([place])   . Diabetes mellitus without complication ([place])    . Hypertension   . Lung cancer ([place]) 05/2017   Hx Chemo + rad tx's.  . Pneumonia     PAST SURGICAL HISTORY: Past Surgical History:  Procedure Laterality Date  . ENDOBRONCHIAL ULTRASOUND N/A 05/12/2017   Procedure: ENDOBRONCHIAL ULTRASOUND;  Surgeon: Laverle Hobby, MD;  Location: [facility] ORS;  Service: Pulmonary;  Laterality: N/A;  . FRACTURE SURGERY Left    ANKLE X 2  . TONSILLECTOMY      FAMILY HISTORY: Family History  Problem Relation Age of Onset  . Stroke Mother   . Diabetes Mother   . Hypertension Mother   . Diabetes Father   . Hypertension Father   . Diabetes Sister   . Diabetes Brother   . Heart attack Paternal Uncle   . Stroke Maternal Grandmother     ADVANCED DIRECTIVES (Y/N):  N  HEALTH MAINTENANCE: Social History   Tobacco Use  . Smoking status: Former Smoker    Packs/day: 1.50    Last attempt to quit: 07/31/2017    Years since quitting: 1.0  . Smokeless tobacco: Never Used  Substance Use Topics  . Alcohol use: No  . Drug use: No     Colonoscopy:  PAP:  Bone density:  Lipid panel:  Allergies  Allergen Reactions  . Shrimp [Shellfish Allergy] Nausea And Vomiting    Current Outpatient Medications  Medication Sig Dispense Refill  . albuterol (PROVENTIL HFA;VENTOLIN HFA) 108 (90 BASE) MCG/ACT inhaler Inhale 2 puffs into the lungs every 6 (six) hours as needed for wheezing or shortness of breath.    Marland Kitchen albuterol (PROVENTIL) (2.5 MG/3ML) 0.083% nebulizer solution Take 2.5 mg every 6 (six) hours as needed by nebulization for wheezing or shortness of breath.    Marland Kitchen  ANORO ELLIPTA 62.5-25 MCG/INH AEPB INHALE 1 PUFF INTO THE LUNGS DAILY IN ADDITION TO QVAR 60 each 2  . ATROVENT HFA 17 MCG/ACT inhaler Inhale 1 puff into the lungs 1 day or 1 dose.    . beclomethasone (QVAR) 80 MCG/ACT inhaler Inhale 1 puff into the lungs 2 (two) times daily.    Marland Kitchen glipiZIDE (GLUCOTROL XL) 10 MG 24 hr tablet Take 10 mg by mouth daily with breakfast.    . ibuprofen  (ADVIL,MOTRIN) 200 MG tablet Take 400-800 mg every 8 (eight) hours as needed by mouth (for pain.).    Marland Kitchen lisinopril-hydrochlorothiazide (PRINZIDE,ZESTORETIC) 20-25 MG tablet Take 1 tablet by mouth daily.    . metFORMIN (GLUCOPHAGE) 1000 MG tablet Take 1,000 mg by mouth 2 (two) times daily.    Marland Kitchen omeprazole (PRILOSEC) 20 MG capsule TAKE 1 CAPSULE(20 MG) BY MOUTH DAILY 30 capsule 2  . QVAR REDIHALER 80 MCG/ACT inhaler Inhale 2 puffs into the lungs 1 day or 1 dose.     No current facility-administered medications for this visit.     OBJECTIVE: Vitals:   08/27/18 1351  BP: (!) 146/87  Pulse: (!) 105  Temp: (!) 96.8 F (36 C)     Body mass index is 33.41 kg/m.    ECOG FS:0 - Asymptomatic  General: Well-developed, well-nourished, no acute distress. Eyes: Pink conjunctiva, anicteric sclera. HEENT: Normocephalic, moist mucous membranes. Lungs: Clear to auscultation bilaterally. Heart: Regular rate and rhythm. No rubs, murmurs, or gallops. Abdomen: Soft, nontender, nondistended. No organomegaly noted, normoactive bowel sounds. Musculoskeletal: No edema, cyanosis, or clubbing. Neuro: Alert, answering all questions appropriately. Cranial nerves grossly intact. Skin: No rashes or petechiae noted. Psych: Normal affect.  LAB RESULTS:  Lab Results  Component Value Date   NA 134 (L) 08/27/2018   K 3.9 08/27/2018   CL 97 (L) 08/27/2018   CO2 26 08/27/2018   GLUCOSE 247 (H) 08/27/2018   BUN 13 08/27/2018   CREATININE 0.94 08/27/2018   CALCIUM 9.3 08/27/2018   PROT 7.7 08/27/2018   ALBUMIN 4.2 08/27/2018   AST 25 08/27/2018   ALT 29 08/27/2018   ALKPHOS 104 08/27/2018   BILITOT 0.7 08/27/2018   GFRNONAA >60 08/27/2018   GFRAA >60 08/27/2018    Lab Results  Component Value Date   WBC 9.9 08/27/2018   NEUTROABS 7.2 08/27/2018   HGB 16.9 08/27/2018   HCT 50.5 08/27/2018   MCV 86.5 08/27/2018   PLT 290 08/27/2018     STUDIES: No results found.  ASSESSMENT: Stage IIIb squamous  cell carcinoma of the left lung.  PLAN:    1. 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  Patient was also discussed at cancer conference.  MRI of the brain is negative.  CT scan results from January 26, 2018 reviewed independently with no new or progressive malignancy noted.  Patient initiated his maintenance treatment on September 11, 2017.  Proceed with cycle 26 and final treatment of maintenance durvalumab today.  No further interventions are needed.  Patient will now return to clinic in 3 months with repeat imaging and further evaluation.  If his imaging remains stable, he can transition to every 6 months.    2.  Hyperglycemia: Chronic and unchanged.  Patient's glycemic control slightly worse today and his blood glucose is 247.  He is not receiving steroids as a premedication.  Continue follow-up and treatment with primary care. 3.  Indigestion: Patient does not complain of this today.  Continue omeprazole as prescribed. 4.  Pain: Patient does not complain of this today.  Continue tramadol as needed. 5.  Hypertension: Chronic.  Patient's blood pressure is moderately elevated today.  Continue current treatment as prescribed.  Follow-up with primary care for further evaluation.   Patient expressed understanding and was in agreement with this plan. He also understands that He can call clinic at any time with any questions, concerns, or complaints.   Cancer Staging Squamous cell lung cancer, left [facility]) Staging form: Lung, AJCC 8th Edition - Clinical stage from 05/16/2017: Stage IIIB (cT3, cN2, cM0) - Signed by Lloyd Huger, MD on 05/16/2017   Lloyd Huger, MD   08/29/2018 9:03 AM

## 2018-10-12 ENCOUNTER — Other Ambulatory Visit: Payer: Self-pay | Admitting: Oncology

## 2018-10-12 ENCOUNTER — Other Ambulatory Visit: Payer: Self-pay | Admitting: Internal Medicine

## 2018-11-21 ENCOUNTER — Ambulatory Visit
Admission: EM | Admit: 2018-11-21 | Discharge: 2018-11-21 | Disposition: A | Discharge status: Home / Self Care | Payer: BC Managed Care – PPO | Attending: Urgent Care | Admitting: Urgent Care

## 2018-11-21 ENCOUNTER — Other Ambulatory Visit: Payer: Self-pay

## 2018-11-21 DIAGNOSIS — E119 Type 2 diabetes mellitus without complications: Secondary | ICD-10-CM

## 2018-11-21 DIAGNOSIS — Z760 Encounter for issue of repeat prescription: Secondary | ICD-10-CM

## 2018-11-21 DIAGNOSIS — I10 Essential (primary) hypertension: Secondary | ICD-10-CM | POA: Diagnosis not present

## 2018-11-21 MED ORDER — METFORMIN HCL 1000 MG PO TABS: 1000.0000 mg | ORAL_TABLET | Freq: Two times a day (BID) | ORAL | 0 refills | Status: DC

## 2018-11-21 MED ORDER — LISINOPRIL-HYDROCHLOROTHIAZIDE 20-25 MG PO TABS: 1.0000 | ORAL_TABLET | Freq: Every day | ORAL | 0 refills | Status: DC

## 2018-11-21 MED ORDER — GLIPIZIDE ER 10 MG PO TB24: 10.0000 mg | ORAL_TABLET | Freq: Every day | ORAL | 0 refills | Status: DC

## 2018-11-21 MED ORDER — ALBUTEROL SULFATE HFA 108 (90 BASE) MCG/ACT IN AERS: 2.0000 | INHALATION_SPRAY | Freq: Four times a day (QID) | RESPIRATORY_TRACT | 0 refills | Status: DC | PRN

## 2018-11-21 NOTE — ED Provider Notes (Addendum)
[address] [place], [place] [address] [phone]   Name: Gabriel Mendez DOB: 11-12-1960 MRN: [id number] CSN: [id number] PCP: No primary care provider on file.  Arrival date and time:  11/21/18 1006  Chief Complaint:  Medication Refill and Hypertension  NOTE: Prior to seeing the patient today, I have reviewed the triage nursing documentation and vital signs. Clinical staff has updated patient's PMH/PSHx, current medication list, and drug allergies/intolerances to ensure comprehensive history available to assist in medical decision making.   History:   HPI: Gabriel Mendez is a 58 y.o. male who presents today with requests for refills on some of his routine medications. Patient notes that he called pharmacy to refill his medications and found out that there were no remaining refills. He was directed to see his provider. Patient contacted the office of Othelia Pulling, [place] and learned that the practice had been closed. Patient unsure who assumed care of the patients under this practice. He is actively seeking to establish care with another PCP at this time.   Patient with a PMH significant for diabetes, HTN, COPD, and lung cancer. He is currently under the care of Dr. Delight H[facility] at [facility] for stage IIIb squamous cell carcinoma in the LEFT lung. Patient noting that he completed 26 cycles of maintenance durvalumab in March 2020. He is scheduled for interval CT imaging on 12/01/2018 for assessment treatment response, followed by a visit with oncology the next day.   Patient presents today advising that he has been out of his medications for about 3 days. He called the pharmacy to request "an emergency supply", however he was turned away citing that there were no active prescriptions. Patient with chronic shortness of breath related to his underlying COPD and pulmonary malignancy. He notes that his breathing has been 'worse" and he is wheezing more due to him being  out of prescribed SABA (albuterol) MDI.   Patient presents hypertensive with a documented blood pressure of 176/97. Patient denies associated chest pain or palpitations. Patient does not routinely monitor his CBGs at home. Last available labs available for review were done on 08/27/2018. Patient with an elevated glucose level of 247 mg/dL. Patient unable to advise as to whether or not he was taking/receiving systemic steroids as a part of his immune checkpoint inhibitor therapy.   Past Medical History:  Diagnosis Date   COPD (chronic obstructive pulmonary disease) ([place])    Diabetes mellitus without complication ([place])    Hypertension    Lung cancer ([place]) 05/2017   Hx Chemo + rad tx's.   Pneumonia     Past Surgical History:  Procedure Laterality Date   ENDOBRONCHIAL ULTRASOUND N/A 05/12/2017   Procedure: ENDOBRONCHIAL ULTRASOUND;  Surgeon: Laverle Hobby, MD;  Location: [facility] ORS;  Service: Pulmonary;  Laterality: N/A;   FRACTURE SURGERY Left    ANKLE X 2   TONSILLECTOMY      Family History  Problem Relation Age of Onset   Stroke Mother    Diabetes Mother    Hypertension Mother    Diabetes Father    Hypertension Father    Diabetes Sister    Diabetes Brother    Heart attack Paternal Uncle    Stroke Maternal Grandmother     Social History   Socioeconomic History   Marital status: Divorced    Spouse name: Not on file   Number of children: Not on file   Years of education: Not on file   Highest education level:  Not on file  Occupational History   Not on file  Social Needs   Financial resource strain: Not on file   Food insecurity:    Worry: Not on file    Inability: Not on file   Transportation needs:    Medical: Not on file    Non-medical: Not on file  Tobacco Use   Smoking status: Current Some Day Smoker    Packs/day: 0.50   Smokeless tobacco: Never Used  Substance and Sexual Activity   Alcohol use: No   Drug use: No    Sexual activity: Not on file  Lifestyle   Physical activity:    Days per week: Not on file    Minutes per session: Not on file   Stress: Not on file  Relationships   Social connections:    Talks on phone: Not on file    Gets together: Not on file    Attends religious service: Not on file    Active member of club or organization: Not on file    Attends meetings of clubs or organizations: Not on file    Relationship status: Not on file   Intimate partner violence:    Fear of current or ex partner: Not on file    Emotionally abused: Not on file    Physically abused: Not on file    Forced sexual activity: Not on file  Other Topics Concern   Not on file  Social History Narrative   Not on file    Patient Active Problem List   Diagnosis Date Noted   Goals of care, counseling/discussion 05/16/2017   Squamous cell lung cancer, left ([place]) 05/16/2017    Home Medications:    Current Meds  Medication Sig   albuterol (PROVENTIL) (2.5 MG/3ML) 0.083% nebulizer solution Take 2.5 mg every 6 (six) hours as needed by nebulization for wheezing or shortness of breath.   ANORO ELLIPTA 62.5-25 MCG/INH AEPB INHALE 1 PUFF INTO THE LUNGS DAILY   ATROVENT HFA 17 MCG/ACT inhaler Inhale 1 puff into the lungs 1 day or 1 dose.   beclomethasone (QVAR) 80 MCG/ACT inhaler Inhale 1 puff into the lungs 2 (two) times daily.   ibuprofen (ADVIL,MOTRIN) 200 MG tablet Take 400-800 mg every 8 (eight) hours as needed by mouth (for pain.).   omeprazole (PRILOSEC) 20 MG capsule TAKE 1 CAPSULE(20 MG) BY MOUTH DAILY   QVAR REDIHALER 80 MCG/ACT inhaler Inhale 2 puffs into the lungs 1 day or 1 dose.   [DISCONTINUED] albuterol (PROVENTIL HFA;VENTOLIN HFA) 108 (90 BASE) MCG/ACT inhaler Inhale 2 puffs into the lungs every 6 (six) hours as needed for wheezing or shortness of breath.   [DISCONTINUED] glipiZIDE (GLUCOTROL XL) 10 MG 24 hr tablet Take 10 mg by mouth daily with breakfast.   [DISCONTINUED]  lisinopril-hydrochlorothiazide (PRINZIDE,ZESTORETIC) 20-25 MG tablet Take 1 tablet by mouth daily.   [DISCONTINUED] metFORMIN (GLUCOPHAGE) 1000 MG tablet Take 1,000 mg by mouth 2 (two) times daily.    Allergies:   Shrimp [shellfish allergy]  Review of Systems (ROS): Review of Systems  Constitutional: Positive for fatigue. Negative for chills and fever.  HENT: Negative for congestion, sinus pain and sore throat.   Respiratory: Positive for chest tightness, shortness of breath and wheezing. Negative for cough.        PMH (+) for COPD and stage IIIB squamous cell carcinoma of the LEFT lung  Cardiovascular: Negative for chest pain and palpitations.  Gastrointestinal: Negative for diarrhea, nausea and vomiting.  Endocrine:  PMH (+) for diabetes on oral antidiabetic agents alone; unsure of control as he does not routinely monitor sugars at home.   Musculoskeletal: Negative for back pain and myalgias.  Skin: Negative.   Neurological: Negative for dizziness, weakness, light-headedness, numbness and headaches.  Hematological: Negative for adenopathy.     Physical Exam:  Triage Vital Signs ED Triage Vitals  Enc Vitals Group     BP 11/21/18 1018 (!) 176/97     Pulse Rate 11/21/18 1018 96     Resp 11/21/18 1018 17     Temp 11/21/18 1018 98.3 F (36.8 C)     Temp Source 11/21/18 1018 Oral     SpO2 11/21/18 1018 96 %     Weight 11/21/18 1016 220 lb (99.8 kg)     Height 11/21/18 1016 5' 8.5" (1.74 m)     Head Circumference --      Peak Flow --      Pain Score 11/21/18 1016 0     Pain Loc --      Pain Edu? --      Excl. in [place]? --     Physical Exam  Constitutional: He is oriented to person, place, and time and well-developed, well-nourished, and in no distress.  HENT:  Head: Normocephalic and atraumatic.  Mouth/Throat: Oropharynx is clear and moist and mucous membranes are normal.  Eyes: Pupils are equal, round, and reactive to light. EOM are normal.  Neck: Normal range of  motion. Neck supple. No tracheal deviation present.  Cardiovascular: Normal rate, regular rhythm, normal heart sounds and intact distal pulses. Exam reveals no gallop and no friction rub.  No murmur heard. Pulmonary/Chest: Effort normal. No respiratory distress. He has wheezes (scattered expiratory). He has no rales.  Lymphadenopathy:    He has no cervical adenopathy.  Neurological: He is alert and oriented to person, place, and time.  Skin: Skin is warm and dry. No rash noted. No erythema.  Psychiatric: Mood, memory, affect and judgment normal.  Nursing note and vitals reviewed.    Urgent Care Treatments / Results:   LABS: PLEASE NOTE: all labs that were ordered this encounter are listed, however only abnormal results are displayed. Labs Reviewed - No data to display  EKG: -None  RADIOLOGY: No results found.  PROCEDURES: Procedures  MEDICATIONS RECEIVED THIS VISIT: Medications - No data to display  PERTINENT CLINICAL COURSE NOTES/UPDATES:   Initial Impression / Assessment and Plan / Urgent Care Course:    Gabriel Mendez is a 58 y.o. male who presents to [facility] today with complaints of Medication Refill and Hypertension  Pertinent labs & imaging results that were available during my care of the patient were personally reviewed by me and considered in my medical decision making (see lab/imaging section of note for values and interpretations).  Patient is well appearing and in NAD. (+) wheezing on exam; SPO2 96% on RA. Blood pressure elevated. Patient out of routine medications for the last 3 days. PCP no longer in practice per [facility] website. Discussed setting patient up with local provider to establish primary care, however refused citing that he is actively exploring options on his own. Patient requesting refills on several of his maintenance medications. Generally, the requested medications require lab monitoring to ensure therapy efficacy, which in turn  drives dose titrations. In review of his EMR, patient is a patient in the cancer center. Recent labs from the cancer center  reviewed as follows:  Lab Results  Component  Value Date   HGB 16.9 08/27/2018   HCT 50.5 08/27/2018   WBC 9.9 08/27/2018   NA 134 (L) 08/27/2018   K 3.9 08/27/2018   CL 97 (L) 08/27/2018   CO2 26 08/27/2018   GLUCOSE 247 (H) 08/27/2018   CALCIUM 9.3 08/27/2018   BUN 13 08/27/2018   CREATININE 0.94 08/27/2018   ALBUMIN 4.2 08/27/2018   AST 25 08/27/2018   ALT 29 08/27/2018   ALKPHOS 104 08/27/2018   BILITOT 0.7 08/27/2018    Patient has normal renal function, in addition to normal LFTs. In light of his current situation, I will oblige his request for maintenance medication refills. Again, he was advised that this is generally not common practice due to the monitoring requirements associated with both his chronic co-morbid conditions and the medications being used to treat him. Patient verbalizes understanding that refills for a 30 day supply will be sent in to his pharmacy his request. Patient will need to find a PCP over the course of the next month. If he is unable to locate a provider taking new patients during current pandemic conditions (COVID), he has been instructed to contact this clinic and ask for me personally. I will make efforts to get him in with someone in order to establish care.   Patient encouraged to monitor his blood sugars daily. He was given written information on today's AVS regarding home glucose monitoring. Discussed obtaining a home BP cuff, or having blood pressure monitored at his pharmacy or local fire department. Patient provided with written information on DASH diet.  I have reviewed the follow up and strict return precautions for any new or worsening symptoms. Patient is aware of symptoms that would be deemed urgent/emergent, and would thus require further evaluation either here or in the emergency department. At the time of discharge, he  verbalized understanding and consent with the discharge plan as it was reviewed with him. All questions were fielded by provider and/or clinic staff prior to patient discharge.    Final Clinical Impressions(s) / Urgent Care Diagnoses:   Final diagnoses:  Encounter for medication refill  Hypertension, unspecified type  Type 2 diabetes mellitus without complication, without long-term current use of insulin ([place])    New Prescriptions:   Meds ordered this encounter  Medications   albuterol (VENTOLIN HFA) 108 (90 Base) MCG/ACT inhaler    Sig: Inhale 2 puffs into the lungs every 6 (six) hours as needed for wheezing or shortness of breath.    Dispense:  1 Inhaler    Refill:  0   glipiZIDE (GLUCOTROL XL) 10 MG 24 hr tablet    Sig: Take 1 tablet (10 mg total) by mouth daily with breakfast.    Dispense:  30 tablet    Refill:  0   lisinopril-hydrochlorothiazide (ZESTORETIC) 20-25 MG tablet    Sig: Take 1 tablet by mouth daily.    Dispense:  30 tablet    Refill:  0   metFORMIN (GLUCOPHAGE) 1000 MG tablet    Sig: Take 1 tablet (1,000 mg total) by mouth 2 (two) times daily.    Dispense:  60 tablet    Refill:  0    Controlled Substance Prescriptions:  [place] Controlled Substance Registry consulted? Not Applicable  NOTE: This note was prepared using Dragon dictation software along with smaller phrase technology. Despite my best ability to proofread, there is the potential that transcriptional errors may still occur from this process, and are completely unintentional.     Honor Loh  E, NP 11/22/18 1520

## 2018-11-21 NOTE — Discharge Instructions (Signed)
It was very nice meeting you today in clinic. Thank you for entrusting me with your care.   As discussed, please arrange to establish care with another primary care provider for ongoing management and care of your diabetes and hypertension. I am sure you are well versed on your diagnoses, however I have included some written information for your review.   Refills have been sent in for your medications as requested.   If your condition worsens before you can establish care, please seek follow up care either here or in the ER. Please remember, our [facility] providers are "right here with you" when you need [place].   Again, it was my pleasure to take care of you today. Thank you for choosing our clinic. I hope that you start to feel better quickly.   Honor Loh, MSN, APRN, FNP-C, CEN Advanced Practice Provider [facility]

## 2018-11-21 NOTE — ED Triage Notes (Signed)
Patient complains of needing a medication refill after PCP closed without letting him know. Patient states that he needs Lisinopril-HCTZ and Metformin, glipizide and proair.

## 2018-11-27 ENCOUNTER — Ambulatory Visit: Payer: BLUE CROSS/BLUE SHIELD | Admitting: Oncology

## 2018-11-29 NOTE — Progress Notes (Deleted)
[place]  Telephone:(336) [phone] [phone]) [phone]  ID: Gabriel Mendez OB: November 23, 1960  MR#: [id number]  OIN#:[id number]  Patient Care Team: Gunnar Bulla as PCP - General (Physician Assistant) Telford Nab, RN as Registered Nurse  I connected with Gabriel Mendez on 11/29/18 at 10:00 AM EDT by {Blank single:19197::"video enabled telemedicine visit","telephone visit"} and verified that I am speaking with the correct person using two identifiers.   I discussed the limitations, risks, security and privacy concerns of performing an evaluation and management service by telemedicine and the availability of in-person appointments. I also discussed with the patient that there may be a patient responsible charge related to this service. The patient expressed understanding and agreed to proceed.   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  INTERVAL HISTORY: Patient returns to clinic today for further evaluation and consideration of cycle 26 and final treatment of maintenance durvalumab.  He continues to complain of fatigue, but otherwise feels well and is tolerating his treatments without significant side effects.  Despite this, he continues to be active and work full-time. He has no neurologic complaints. He has a good appetite and denies weight loss.  He denies any chest pain, cough, hemoptysis, or shortness of breath.  He has no nausea, vomiting, constipation, or diarrhea.  He has no urinary complaints.  Patient offers no further specific complaints today.  REVIEW OF SYSTEMS:   Review of Systems  Constitutional: Positive for malaise/fatigue. Negative for fever and weight loss.  HENT: Negative.  Negative for congestion.   Respiratory: Negative.  Negative for cough, hemoptysis and shortness of breath.    Cardiovascular: Negative.  Negative for chest pain and leg swelling.  Gastrointestinal: Negative.  Negative for abdominal pain and heartburn.  Genitourinary: Negative.  Negative for dysuria.  Musculoskeletal: Negative.  Negative for joint pain.  Skin: Negative.  Negative for rash.  Neurological: Negative.  Negative for dizziness, sensory change, focal weakness, weakness and headaches.  Psychiatric/Behavioral: Negative.  The patient is not nervous/anxious.     As per HPI. Otherwise, a complete review of systems is negative.  PAST MEDICAL HISTORY: Past Medical History:  Diagnosis Date  . COPD (chronic obstructive pulmonary disease) ([place])   . Diabetes mellitus without complication ([place])   . Hypertension   . Lung cancer ([place]) 05/2017   Hx Chemo + rad tx's.  . Pneumonia     PAST SURGICAL HISTORY: Past Surgical History:  Procedure Laterality Date  . ENDOBRONCHIAL ULTRASOUND N/A 05/12/2017   Procedure: ENDOBRONCHIAL ULTRASOUND;  Surgeon: Laverle Hobby, MD;  Location: [facility] ORS;  Service: Pulmonary;  Laterality: N/A;  . FRACTURE SURGERY Left    ANKLE X 2  . TONSILLECTOMY      FAMILY HISTORY: Family History  Problem Relation Age of Onset  . Stroke Mother   . Diabetes Mother   . Hypertension Mother   . Diabetes Father   . Hypertension Father   . Diabetes Sister   . Diabetes Brother   . Heart attack Paternal Uncle   . Stroke Maternal Grandmother     ADVANCED DIRECTIVES (Y/N):  N  HEALTH MAINTENANCE: Social History   Tobacco Use  . Smoking status: Current Some Day Smoker    Packs/day: 0.50  . Smokeless tobacco: Never Used  Substance Use Topics  . Alcohol use: No  . Drug  use: No     Colonoscopy:  PAP:  Bone density:  Lipid panel:  Allergies  Allergen Reactions  . Shrimp [Shellfish Allergy] Nausea And Vomiting    Current Outpatient Medications  Medication Sig Dispense Refill  . albuterol (PROVENTIL) (2.5 MG/3ML) 0.083% nebulizer solution Take 2.5 mg  every 6 (six) hours as needed by nebulization for wheezing or shortness of breath.    Marland Kitchen albuterol (VENTOLIN HFA) 108 (90 Base) MCG/ACT inhaler Inhale 2 puffs into the lungs every 6 (six) hours as needed for wheezing or shortness of breath. 1 Inhaler 0  . ANORO ELLIPTA 62.5-25 MCG/INH AEPB INHALE 1 PUFF INTO THE LUNGS DAILY 60 each 2  . ATROVENT HFA 17 MCG/ACT inhaler Inhale 1 puff into the lungs 1 day or 1 dose.    . beclomethasone (QVAR) 80 MCG/ACT inhaler Inhale 1 puff into the lungs 2 (two) times daily.    Marland Kitchen glipiZIDE (GLUCOTROL XL) 10 MG 24 hr tablet Take 1 tablet (10 mg total) by mouth daily with breakfast. 30 tablet 0  . ibuprofen (ADVIL,MOTRIN) 200 MG tablet Take 400-800 mg every 8 (eight) hours as needed by mouth (for pain.).    Marland Kitchen lisinopril-hydrochlorothiazide (ZESTORETIC) 20-25 MG tablet Take 1 tablet by mouth daily. 30 tablet 0  . metFORMIN (GLUCOPHAGE) 1000 MG tablet Take 1 tablet (1,000 mg total) by mouth 2 (two) times daily. 60 tablet 0  . omeprazole (PRILOSEC) 20 MG capsule TAKE 1 CAPSULE(20 MG) BY MOUTH DAILY 30 capsule 2  . QVAR REDIHALER 80 MCG/ACT inhaler Inhale 2 puffs into the lungs 1 day or 1 dose.     No current facility-administered medications for this visit.     OBJECTIVE: There were no vitals filed for this visit.   There is no height or weight on file to calculate BMI.    ECOG FS:0 - Asymptomatic  General: Well-developed, well-nourished, no acute distress. Eyes: Pink conjunctiva, anicteric sclera. HEENT: Normocephalic, moist mucous membranes. Lungs: Clear to auscultation bilaterally. Heart: Regular rate and rhythm. No rubs, murmurs, or gallops. Abdomen: Soft, nontender, nondistended. No organomegaly noted, normoactive bowel sounds. Musculoskeletal: No edema, cyanosis, or clubbing. Neuro: Alert, answering all questions appropriately. Cranial nerves grossly intact. Skin: No rashes or petechiae noted. Psych: Normal affect.  LAB RESULTS:  Lab Results  Component  Value Date   NA 134 (L) 08/27/2018   K 3.9 08/27/2018   CL 97 (L) 08/27/2018   CO2 26 08/27/2018   GLUCOSE 247 (H) 08/27/2018   BUN 13 08/27/2018   CREATININE 0.94 08/27/2018   CALCIUM 9.3 08/27/2018   PROT 7.7 08/27/2018   ALBUMIN 4.2 08/27/2018   AST 25 08/27/2018   ALT 29 08/27/2018   ALKPHOS 104 08/27/2018   BILITOT 0.7 08/27/2018   GFRNONAA >60 08/27/2018   GFRAA >60 08/27/2018    Lab Results  Component Value Date   WBC 9.9 08/27/2018   NEUTROABS 7.2 08/27/2018   HGB 16.9 08/27/2018   HCT 50.5 08/27/2018   MCV 86.5 08/27/2018   PLT 290 08/27/2018     STUDIES: No results found.  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  PLAN:    1. 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  Patient was also discussed at cancer conference.  MRI of the brain is negative.  CT scan results from January 26, 2018 reviewed independently with no new or progressive malignancy noted.  Patient initiated his maintenance treatment on September 11, 2017.  Proceed with cycle 26 and final treatment of maintenance durvalumab today.  No further interventions are needed.  Patient will now return to clinic in 3 months with repeat imaging and further evaluation.  If his imaging remains stable, he can transition to every 6 months.    2.  Hyperglycemia: Chronic and unchanged.  Patient's glycemic control slightly worse today and his blood glucose is 247.  He is not receiving steroids as a premedication.  Continue follow-up and treatment with primary care. 3.  Indigestion: Patient does not complain of this today.  Continue omeprazole as prescribed. 4.  Pain: Patient does not complain of this today.  Continue tramadol as needed. 5.  Hypertension: Chronic.  Patient's blood pressure is moderately elevated today.  Continue current treatment as prescribed.  Follow-up with primary care for further evaluation.  I provided *** minutes  of {Blank single:19197::"face-to-face video visit time","non face-to-face telephone visit time"} during this encounter, and > 50% was spent counseling as documented under my assessment & plan.  Patient expressed understanding and was in agreement with this plan. He also understands that He can call clinic at any time with any questions, concerns, or complaints.   Cancer Staging Squamous cell lung cancer, left [facility]) Staging form: Lung, AJCC 8th Edition - Clinical stage from 05/16/2017: Stage IIIB (cT3, cN2, cM0) - Signed by Lloyd Huger, MD on 05/16/2017   Lloyd Huger, MD   11/29/2018 10:46 AM

## 2018-12-01 ENCOUNTER — Inpatient Hospital Stay: Payer: BC Managed Care – PPO | Attending: Oncology

## 2018-12-01 ENCOUNTER — Other Ambulatory Visit: Payer: Self-pay

## 2018-12-01 ENCOUNTER — Ambulatory Visit
Admission: RE | Admit: 2018-12-01 | Discharge: 2018-12-01 | Disposition: A | Discharge status: Home / Self Care | Payer: BC Managed Care – PPO | Source: Ambulatory Visit | Attending: Oncology | Admitting: Oncology

## 2018-12-01 DIAGNOSIS — C3492 Malignant neoplasm of unspecified part of left bronchus or lung: Secondary | ICD-10-CM

## 2018-12-01 HISTORY — DX: Unspecified asthma, uncomplicated: J45.909

## 2018-12-01 LAB — COMPREHENSIVE METABOLIC PANEL
ALT: 22 U/L (ref 0–44)
AST: 17 U/L (ref 15–41)
Albumin: 3.9 g/dL (ref 3.5–5.0)
Alkaline Phosphatase: 95 U/L (ref 38–126)
Anion gap: 12 (ref 5–15)
BUN: 11 mg/dL (ref 6–20)
CO2: 26 mmol/L (ref 22–32)
Calcium: 8.9 mg/dL (ref 8.9–10.3)
Chloride: 96 mmol/L — ABNORMAL LOW (ref 98–111)
Creatinine, Ser: 0.78 mg/dL (ref 0.61–1.24)
GFR calc Af Amer: >60 mL/min (ref >60)
GFR calc non Af Amer: >60 mL/min (ref >60)
Glucose, Bld: 251 mg/dL — ABNORMAL HIGH (ref 70–99)
Potassium: 4.1 mmol/L (ref 3.5–5.1)
Sodium: 134 mmol/L — ABNORMAL LOW (ref 135–145)
Total Bilirubin: 0.7 mg/dL (ref 0.3–1.2)
Total Protein: 7 g/dL (ref 6.5–8.1)

## 2018-12-01 LAB — CBC WITH DIFFERENTIAL/PLATELET
Abs Immature Granulocytes: 0.15 10*3/uL — ABNORMAL HIGH (ref 0.00–0.07)
Basophils Absolute: 0.1 10*3/uL (ref 0.0–0.1)
Basophils Relative: 1 %
Eosinophils Absolute: 0.4 10*3/uL (ref 0.0–0.5)
Eosinophils Relative: 3 %
HCT: 46.9 % (ref 39.0–52.0)
Hemoglobin: 15.6 g/dL (ref 13.0–17.0)
Immature Granulocytes: 1 %
Lymphocytes Relative: 11 %
Lymphs Abs: 1.2 10*3/uL (ref 0.7–4.0)
MCH: 28.8 pg (ref 26.0–34.0)
MCHC: 33.3 g/dL (ref 30.0–36.0)
MCV: 86.7 fL (ref 80.0–100.0)
Monocytes Absolute: 0.8 10*3/uL (ref 0.1–1.0)
Monocytes Relative: 7 %
Neutro Abs: 8.5 10*3/uL — ABNORMAL HIGH (ref 1.7–7.7)
Neutrophils Relative %: 77 %
Platelets: 271 10*3/uL (ref 150–400)
RBC: 5.41 MIL/uL (ref 4.22–5.81)
RDW: 13.1 % (ref 11.5–15.5)
WBC: 11.2 10*3/uL — ABNORMAL HIGH (ref 4.0–10.5)
nRBC: 0 % (ref 0.0–0.2)

## 2018-12-01 MED ORDER — IOHEXOL 300 MG/ML  SOLN
75.0000 mL | Freq: Once | INTRAMUSCULAR | Status: AC | PRN
  Administered 2018-12-01: 75 mL via INTRAVENOUS

## 2018-12-02 ENCOUNTER — Encounter: Payer: Self-pay | Admitting: Oncology

## 2018-12-02 ENCOUNTER — Inpatient Hospital Stay: Payer: BC Managed Care – PPO | Admitting: Oncology

## 2018-12-02 LAB — THYROID PANEL WITH TSH
Free Thyroxine Index: 1.1 — ABNORMAL LOW (ref 1.2–4.9)
T3 Uptake Ratio: 20 % — ABNORMAL LOW (ref 24–39)
T4, Total: 5.3 ug/dL (ref 4.5–12.0)
TSH: 25.07 u[IU]/mL — ABNORMAL HIGH (ref 0.450–4.500)

## 2018-12-03 ENCOUNTER — Telehealth: Payer: Self-pay | Admitting: *Deleted

## 2018-12-03 NOTE — Telephone Encounter (Signed)
Patient called reporting that he was to have gotten a televist with Dr Grayland Ormond yesterday and never got the call to get his results. Per appointment, patient did not answer his phone. He is asking for someone to call him with is results from his CT done Tuesday  IMPRESSION: 1. Increased thickness of consolidation band in the RIGHT upper lobe. Within this the thickened band, there is new low-attenuation with air bronchograms. Differential would include focus of pulmonary infection versus less likely lung cancer recurrence. Recommend clinical correlation and short-term follow-up CT imaging. 2. RIGHT hilar Peribronchial thickening similar comparison exam. No mediastinal lymphadenopathy. 3. Stable adrenal gland thickening favored hyperplasia.   Electronically Signed   By: Suzy Bouchard M.D.   On: 12/01/2018 12:46

## 2018-12-08 NOTE — Telephone Encounter (Signed)
Please reschedule visit.  I believe we tried to call him multiple times and he did not pick up.

## 2018-12-09 ENCOUNTER — Other Ambulatory Visit: Payer: Self-pay

## 2018-12-09 ENCOUNTER — Inpatient Hospital Stay (HOSPITAL_BASED_OUTPATIENT_CLINIC_OR_DEPARTMENT_OTHER): Payer: BC Managed Care – PPO | Admitting: Oncology

## 2018-12-09 ENCOUNTER — Encounter: Payer: Self-pay | Admitting: Oncology

## 2018-12-09 DIAGNOSIS — I10 Essential (primary) hypertension: Secondary | ICD-10-CM | POA: Diagnosis not present

## 2018-12-09 DIAGNOSIS — C3490 Malignant neoplasm of unspecified part of unspecified bronchus or lung: Secondary | ICD-10-CM

## 2018-12-09 DIAGNOSIS — E1165 Type 2 diabetes mellitus with hyperglycemia: Secondary | ICD-10-CM

## 2018-12-09 DIAGNOSIS — F17210 Nicotine dependence, cigarettes, uncomplicated: Secondary | ICD-10-CM

## 2018-12-09 DIAGNOSIS — K30 Functional dyspepsia: Secondary | ICD-10-CM

## 2018-12-09 NOTE — Telephone Encounter (Signed)
Please call patient at 3:30 today. I have r\s his appt.

## 2018-12-09 NOTE — Telephone Encounter (Signed)
I have meetings 1-3.  I can call him before or after that. Thanks!

## 2018-12-09 NOTE — Progress Notes (Signed)
Patient stated that he had been doing great with no complaints at this time. Patient would like his CT Scan results.

## 2018-12-09 NOTE — Telephone Encounter (Signed)
Spoke with Gabriel Mendez this morning and he would like for you to call him @ 1:00 pm today if you could. I told him it was your half day, but I would ask. If that doesn't work, what time would be good for you too call him?

## 2018-12-11 NOTE — Progress Notes (Signed)
[place]  Telephone:(336) [phone] Fax:(336) [phone]  ID: Gabriel Mendez OB: 03/11/1961  MR#: [id number]  IZT#:[id number]  Patient Care Team: Gunnar Bulla as PCP - General (Physician Assistant) Telford Nab, RN as Registered Nurse  I connected with [name] on 12/11/18 at  3:30 PM EDT by telephone visit and verified that I am speaking with the correct person using two identifiers.   I discussed the limitations, risks, security and privacy concerns of performing an evaluation and management service by telemedicine and the availability of in-person appointments. I also discussed with the patient that there may be a patient responsible charge related to this service. The patient expressed understanding and agreed to proceed.   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  INTERVAL HISTORY: Patient agreed to evaluation and discussion of his imaging results via telephone today.  He currently feels well and is asymptomatic.  His weakness and fatigue have resolved.  He has no neurologic complaints. He has a good appetite and denies weight loss.  He denies any chest pain, cough, hemoptysis, or shortness of breath.  He has no nausea, vomiting, constipation, or diarrhea.  He has no urinary complaints.  Patient feels at his baseline and offers no specific complaints today.  REVIEW OF SYSTEMS:   Review of Systems  Constitutional: Negative.  Negative for fever, malaise/fatigue and weight loss.  HENT: Negative.  Negative for congestion.   Respiratory: Negative.  Negative for cough, hemoptysis and shortness of breath.   Cardiovascular: Negative.  Negative for chest pain and leg swelling.  Gastrointestinal: Negative.  Negative for abdominal pain and heartburn.  Genitourinary: Negative.  Negative for  dysuria.  Musculoskeletal: Negative.  Negative for joint pain.  Skin: Negative.  Negative for rash.  Neurological: Negative.  Negative for dizziness, sensory change, focal weakness, weakness and headaches.  Psychiatric/Behavioral: Negative.  The patient is not nervous/anxious.     As per HPI. Otherwise, a complete review of systems is negative.  PAST MEDICAL HISTORY: Past Medical History:  Diagnosis Date  . Asthma   . COPD (chronic obstructive pulmonary disease) ([place])   . Diabetes mellitus without complication ([place])   . Hypertension   . Lung cancer ([place]) 05/2017   Hx Chemo + rad tx's.  . Pneumonia     PAST SURGICAL HISTORY: Past Surgical History:  Procedure Laterality Date  . ENDOBRONCHIAL ULTRASOUND N/A 05/12/2017   Procedure: ENDOBRONCHIAL ULTRASOUND;  Surgeon: Laverle Hobby, MD;  Location: [facility] ORS;  Service: Pulmonary;  Laterality: N/A;  . FRACTURE SURGERY Left    ANKLE X 2  . TONSILLECTOMY      FAMILY HISTORY: Family History  Problem Relation Age of Onset  . Stroke Mother   . Diabetes Mother   . Hypertension Mother   . Diabetes Father   . Hypertension Father   . Diabetes Sister   . Diabetes Brother   . Heart attack Paternal Uncle   . Stroke Maternal Grandmother     ADVANCED DIRECTIVES (Y/N):  N  HEALTH MAINTENANCE: Social History   Tobacco Use  . Smoking status: Current Some Day Smoker    Packs/day: 0.50  . Smokeless tobacco: Never Used  Substance Use Topics  . Alcohol use: No  . Drug use: No     Colonoscopy:  PAP:  Bone density:  Lipid panel:  Allergies  Allergen Reactions  . Shrimp [Shellfish Allergy] Nausea And Vomiting    Current Outpatient Medications  Medication Sig Dispense Refill  . albuterol (PROVENTIL) (2.5 MG/3ML) 0.083% nebulizer solution Take 2.5 mg every 6 (six) hours as needed by nebulization for wheezing or shortness of breath.    Marland Kitchen albuterol (VENTOLIN HFA) 108 (90 Base) MCG/ACT inhaler Inhale 2 puffs into the lungs  every 6 (six) hours as needed for wheezing or shortness of breath. 1 Inhaler 0  . ANORO ELLIPTA 62.5-25 MCG/INH AEPB INHALE 1 PUFF INTO THE LUNGS DAILY 60 each 2  . ATROVENT HFA 17 MCG/ACT inhaler Inhale 1 puff into the lungs 1 day or 1 dose.    . beclomethasone (QVAR) 80 MCG/ACT inhaler Inhale 1 puff into the lungs 2 (two) times daily.    Marland Kitchen glipiZIDE (GLUCOTROL XL) 10 MG 24 hr tablet Take 1 tablet (10 mg total) by mouth daily with breakfast. 30 tablet 0  . ibuprofen (ADVIL,MOTRIN) 200 MG tablet Take 400-800 mg every 8 (eight) hours as needed by mouth (for pain.).    Marland Kitchen lisinopril-hydrochlorothiazide (ZESTORETIC) 20-25 MG tablet Take 1 tablet by mouth daily. 30 tablet 0  . metFORMIN (GLUCOPHAGE) 1000 MG tablet Take 1 tablet (1,000 mg total) by mouth 2 (two) times daily. 60 tablet 0  . omeprazole (PRILOSEC) 20 MG capsule TAKE 1 CAPSULE(20 MG) BY MOUTH DAILY 30 capsule 2  . QVAR REDIHALER 80 MCG/ACT inhaler Inhale 2 puffs into the lungs 1 day or 1 dose.     No current facility-administered medications for this visit.     OBJECTIVE: There were no vitals filed for this visit.   There is no height or weight on file to calculate BMI.    ECOG FS:0 - Asymptomatic   LAB RESULTS:  Lab Results  Component Value Date   NA 134 (L) 12/01/2018   K 4.1 12/01/2018   CL 96 (L) 12/01/2018   CO2 26 12/01/2018   GLUCOSE 251 (H) 12/01/2018   BUN 11 12/01/2018   CREATININE 0.78 12/01/2018   CALCIUM 8.9 12/01/2018   PROT 7.0 12/01/2018   ALBUMIN 3.9 12/01/2018   AST 17 12/01/2018   ALT 22 12/01/2018   ALKPHOS 95 12/01/2018   BILITOT 0.7 12/01/2018   GFRNONAA >60 12/01/2018   GFRAA >60 12/01/2018    Lab Results  Component Value Date   WBC 11.2 (H) 12/01/2018   NEUTROABS 8.5 (H) 12/01/2018   HGB 15.6 12/01/2018   HCT 46.9 12/01/2018   MCV 86.7 12/01/2018   PLT 271 12/01/2018     STUDIES: Ct Chest W Contrast  Result Date: 12/01/2018 CLINICAL DATA:  Follow-up CT scan.  Lung cancer.  Short of  breath. EXAM: CT CHEST WITH CONTRAST TECHNIQUE: Multidetector CT imaging of the chest was performed during intravenous contrast administration. CONTRAST:  91mL OMNIPAQUE IOHEXOL 300 MG/ML  SOLN COMPARISON:  PET-CT scan 09/08/2017 FINDINGS: Cardiovascular: No significant vascular findings. Normal heart size. Coronary artery calcification and aortic atherosclerotic calcification. Mediastinum/Nodes: No axillary supraclavicular adenopathy. No mediastinal hilar adenopathy. Peribronchial thickening in the RIGHT hilum similar. Lungs/Pleura: Band of parenchymal thickening extending from the RIGHT hilum to the pleural surface with central air bronchogram.The peripheral aspect of this consolidated pattern is increased in thickness. Within this band of parenchymal consolidation, there is a new low density focus with air bronchograms measuring 2.4 x 2.6 cm (image 61/2. The RIGHT lower lobe is clear. LEFT lung is clear. Upper Abdomen: Thickening of the adrenal glands similar comparison exam. No hepatic lesion.  Musculoskeletal: No aggressive osseous lesion. IMPRESSION: 1. Increased thickness of consolidation band in the RIGHT upper lobe. Within this the thickened band, there is new low-attenuation with air bronchograms. Differential would include focus of pulmonary infection versus less likely lung cancer recurrence. Recommend clinical correlation and short-term follow-up CT imaging. 2. RIGHT hilar Peribronchial thickening similar comparison exam. No mediastinal lymphadenopathy. 3. Stable adrenal gland thickening favored hyperplasia. Electronically Signed   By: Suzy Bouchard M.D.   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  PLAN:    1. 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  He subsequently completed 1 year of maintenance durvalumab on August 27, 2018.  Restaging CT  scan on December 01, 2018 reviewed independently and reported as above with no obvious evidence of recurrent or progressive disease.  No intervention is needed at this time.  Return to clinic in 3 months with repeat imaging and further evaluation at which point patient can likely be transitioned to imaging every 6 months.   2.  Hyperglycemia: Chronic and unchanged.  Continue follow-up and treatment with primary care. 3.  Indigestion: Patient does not complain of this today.  Continue omeprazole as prescribed. 4.  Pain: Patient does not complain of this today.  Continue tramadol as needed. 5.  Hypertension: Continue evaluation and treatment per primary care.  I provided 25 minutes of non face-to-face telephone visit time during this encounter, and > 50% was spent counseling as documented under my assessment & plan.    Patient expressed understanding and was in agreement with this plan. He also understands that He can call clinic at any time with any questions, concerns, or complaints.   Cancer Staging Squamous cell lung cancer, left [facility]) Staging form: Lung, AJCC 8th Edition - Clinical stage from 05/16/2017: Stage IIIB (cT3, cN2, cM0) - Signed by Lloyd Huger, MD on 05/16/2017   Lloyd Huger, MD   12/11/2018 6:44 AM

## 2018-12-30 ENCOUNTER — Other Ambulatory Visit: Payer: Self-pay

## 2018-12-30 ENCOUNTER — Ambulatory Visit
Admission: RE | Admit: 2018-12-30 | Discharge: 2018-12-30 | Disposition: A | Discharge status: Home / Self Care | Payer: BC Managed Care – PPO | Source: Ambulatory Visit | Attending: Radiation Oncology | Admitting: Radiation Oncology

## 2018-12-30 ENCOUNTER — Encounter: Payer: Self-pay | Admitting: Radiation Oncology

## 2018-12-30 VITALS — BP 169/84 | HR 88 | Temp 97.6°F | Resp 20 | Wt 225.9 lb

## 2018-12-30 DIAGNOSIS — F17210 Nicotine dependence, cigarettes, uncomplicated: Secondary | ICD-10-CM | POA: Insufficient documentation

## 2018-12-30 DIAGNOSIS — R918 Other nonspecific abnormal finding of lung field: Secondary | ICD-10-CM | POA: Diagnosis not present

## 2018-12-30 DIAGNOSIS — Z9221 Personal history of antineoplastic chemotherapy: Secondary | ICD-10-CM | POA: Diagnosis not present

## 2018-12-30 DIAGNOSIS — Z923 Personal history of irradiation: Secondary | ICD-10-CM | POA: Diagnosis not present

## 2018-12-30 DIAGNOSIS — C3492 Malignant neoplasm of unspecified part of left bronchus or lung: Secondary | ICD-10-CM | POA: Diagnosis not present

## 2018-12-30 NOTE — Progress Notes (Signed)
Radiation Oncology Follow up Note  Name: Gabriel Mendez   Date:   12/30/2018 MRN:  [id number] DOB: 03/14/1961    This 58 y.o. male presents to the clinic today for 20-month follow-up status post.  Concurrent chemoradiation therapy for stage IIIb squamous cell carcinoma of the left lung  REFERRING PROVIDER: Nathaneil Canary, Mamie Nick*  HPI: Patient is a 58 year old male now at 18 months having completed concurrent chemoradiation therapy therapy for stage IIIb squamous cell carcinoma the left lung.  He also was on.1 year of maintenance durvalumab which he tolerated well.  He specifically denies cough hemoptysis or chest tightness.  His most recent CT scan back in June showed increased thickness and consolidation in the band in the right upper upper right lobe with clinical correlation and short-term follow-up with CT imaging recommended.  He is scheduled for follow-up CT scan in September.  COMPLICATIONS OF TREATMENT: none  FOLLOW UP COMPLIANCE: keeps appointments   PHYSICAL EXAM:  BP (!) 169/84   Pulse 88   Temp 97.6 F (36.4 C)   Resp 20   Wt 225 lb 13.8 oz (102.4 kg)   BMI 33.84 kg/m  Well-developed well-nourished patient in NAD. HEENT reveals PERLA, EOMI, discs not visualized.  Oral cavity is clear. No oral mucosal lesions are identified. Neck is clear without evidence of cervical or supraclavicular adenopathy. Lungs are clear to A&P. Cardiac examination is essentially unremarkable with regular rate and rhythm without murmur rub or thrill. Abdomen is benign with no organomegaly or masses noted. Motor sensory and DTR levels are equal and symmetric in the upper and lower extremities. Cranial nerves II through XII are grossly intact. Proprioception is intact. No peripheral adenopathy or edema is identified. No motor or sensory levels are noted. Crude visual fields are within normal range.  RADIOLOGY RESULTS: CT scans are reviewed compatible with above-stated findings  PLAN: Present time  patient is doing well stable disease by CT scan of the chest I will review his CT scans again when they are available in September and see him shortly after and follow-up.  Patient knows to call with any concerns.  I would like to take this opportunity to thank you for allowing me to participate in the care of your patient.[name], MD

## 2019-03-04 ENCOUNTER — Other Ambulatory Visit: Payer: Self-pay

## 2019-03-04 ENCOUNTER — Ambulatory Visit
Admission: RE | Admit: 2019-03-04 | Discharge: 2019-03-04 | Disposition: A | Discharge status: Home / Self Care | Payer: BC Managed Care – PPO | Source: Ambulatory Visit | Attending: Oncology | Admitting: Oncology

## 2019-03-04 DIAGNOSIS — C3490 Malignant neoplasm of unspecified part of unspecified bronchus or lung: Secondary | ICD-10-CM | POA: Insufficient documentation

## 2019-03-04 LAB — POCT I-STAT CREATININE: Creatinine, Ser: 0.7 mg/dL (ref 0.61–1.24)

## 2019-03-04 MED ORDER — IOHEXOL 300 MG/ML  SOLN
75.0000 mL | Freq: Once | INTRAMUSCULAR | Status: AC | PRN
  Administered 2019-03-04: 75 mL via INTRAVENOUS

## 2019-03-04 NOTE — Progress Notes (Signed)
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  INTERVAL HISTORY: Patient returns to clinic today for further evaluation and discussion of his imaging results.  He continues is to feel well and remains asymptomatic.  He is active and continues to work full-time.  He does not complain of weakness or fatigue today.  He has no neurologic complaints. He has a good appetite and denies weight loss.  He denies any chest pain, cough, hemoptysis, or shortness of breath.  He has no nausea, vomiting, constipation, or diarrhea.  He has no urinary complaints.  Patient feels at his baseline and offers no specific complaints today.  REVIEW OF SYSTEMS:   Review of Systems  Constitutional: Negative.  Negative for fever, malaise/fatigue and weight loss.  HENT: Negative.  Negative for congestion.   Respiratory: Negative.  Negative for cough, hemoptysis and shortness of breath.   Cardiovascular: Negative.  Negative for chest pain and leg swelling.  Gastrointestinal: Negative.  Negative for abdominal pain and heartburn.  Genitourinary: Negative.  Negative for dysuria.  Musculoskeletal: Negative.  Negative for joint pain.  Skin: Negative.  Negative for rash.  Neurological: Negative.  Negative for dizziness, sensory change, focal weakness, weakness and headaches.  Psychiatric/Behavioral: Negative.  The patient is not nervous/anxious.     As per HPI. Otherwise, a complete review of systems is negative.  PAST MEDICAL HISTORY: Past Medical History:  Diagnosis Date  . Asthma   . COPD (chronic obstructive pulmonary disease) ([place])   . Diabetes mellitus without complication ([place])   . Hypertension   . Lung cancer ([place])  05/2017   Hx Chemo + rad tx's.  . Pneumonia     PAST SURGICAL HISTORY: Past Surgical History:  Procedure Laterality Date  . ENDOBRONCHIAL ULTRASOUND N/A 05/12/2017   Procedure: ENDOBRONCHIAL ULTRASOUND;  Surgeon: Laverle Hobby, MD;  Location: [facility] ORS;  Service: Pulmonary;  Laterality: N/A;  . FRACTURE SURGERY Left    ANKLE X 2  . TONSILLECTOMY      FAMILY HISTORY: Family History  Problem Relation Age of Onset  . Stroke Mother   . Diabetes Mother   . Hypertension Mother   . Diabetes Father   . Hypertension Father   . Diabetes Sister   . Diabetes Brother   . Heart attack Paternal Uncle   . Stroke Maternal Grandmother     ADVANCED DIRECTIVES (Y/N):  N  HEALTH MAINTENANCE: Social History   Tobacco Use  . Smoking status: Current Some Day Smoker    Packs/day: 0.50  . Smokeless tobacco: Never Used  Substance Use Topics  . Alcohol use: No  . Drug use: No     Colonoscopy:  PAP:  Bone density:  Lipid panel:  Allergies  Allergen Reactions  . Shrimp [Shellfish Allergy] Nausea And Vomiting    Current Outpatient Medications  Medication Sig Dispense Refill  . albuterol (PROVENTIL) (2.5 MG/3ML) 0.083% nebulizer solution Take 2.5 mg every 6 (six) hours as needed by nebulization for wheezing or shortness of breath.    Marland Kitchen albuterol (VENTOLIN HFA) 108 (90 Base) MCG/ACT inhaler Inhale 2 puffs into the lungs every 6 (six) hours as needed for wheezing or shortness of breath. 1 Inhaler 0  . ANORO ELLIPTA 62.5-25 MCG/INH AEPB INHALE 1  PUFF INTO THE LUNGS DAILY 60 each 2  . atorvastatin (LIPITOR) 20 MG tablet Take 20 mg by mouth daily.    . beclomethasone (QVAR) 80 MCG/ACT inhaler Inhale 1 puff into the lungs 2 (two) times daily.    Marland Kitchen ibuprofen (ADVIL,MOTRIN) 200 MG tablet Take 400-800 mg every 8 (eight) hours as needed by mouth (for pain.).    Marland Kitchen lisinopril-hydrochlorothiazide (ZESTORETIC) 20-25 MG tablet Take 1 tablet by mouth daily. 30 tablet 0  . metFORMIN (GLUCOPHAGE)  1000 MG tablet Take 1 tablet (1,000 mg total) by mouth 2 (two) times daily. 60 tablet 0  . omeprazole (PRILOSEC) 20 MG capsule TAKE 1 CAPSULE(20 MG) BY MOUTH DAILY 30 capsule 2   No current facility-administered medications for this visit.     OBJECTIVE: Vitals:   03/11/19 1437  BP: (!) 155/97  Pulse: 91  Resp: 20  Temp: (!) 96.3 F (35.7 C)     Body mass index is 33.31 kg/m.    ECOG FS:0 - Asymptomatic  General: Well-developed, well-nourished, no acute distress. Eyes: Pink conjunctiva, anicteric sclera. HEENT: Normocephalic, moist mucous membranes. Lungs: Clear to auscultation bilaterally. Heart: Regular rate and rhythm. No rubs, murmurs, or gallops. Abdomen: Soft, nontender, nondistended. No organomegaly noted, normoactive bowel sounds. Musculoskeletal: No edema, cyanosis, or clubbing. Neuro: Alert, answering all questions appropriately. Cranial nerves grossly intact. Skin: No rashes or petechiae noted. Psych: Normal affect.  LAB RESULTS:  Lab Results  Component Value Date   NA 134 (L) 12/01/2018   K 4.1 12/01/2018   CL 96 (L) 12/01/2018   CO2 26 12/01/2018   GLUCOSE 251 (H) 12/01/2018   BUN 11 12/01/2018   CREATININE 0.70 03/04/2019   CALCIUM 8.9 12/01/2018   PROT 7.0 12/01/2018   ALBUMIN 3.9 12/01/2018   AST 17 12/01/2018   ALT 22 12/01/2018   ALKPHOS 95 12/01/2018   BILITOT 0.7 12/01/2018   GFRNONAA >60 12/01/2018   GFRAA >60 12/01/2018    Lab Results  Component Value Date   WBC 11.2 (H) 12/01/2018   NEUTROABS 8.5 (H) 12/01/2018   HGB 15.6 12/01/2018   HCT 46.9 12/01/2018   MCV 86.7 12/01/2018   PLT 271 12/01/2018     STUDIES: Ct Chest W Contrast  Result Date: 03/04/2019 CLINICAL DATA:  Restaging lung cancer. EXAM: CT CHEST WITH CONTRAST TECHNIQUE: Multidetector CT imaging of the chest was performed during intravenous contrast administration. CONTRAST:  39mL OMNIPAQUE IOHEXOL 300 MG/ML  SOLN COMPARISON:  12/01/2018 FINDINGS: Cardiovascular: Heart  size appears normal. No pericardial effusion. Aortic atherosclerosis. Three vessel coronary artery atherosclerotic calcifications. Mediastinum/Nodes: Normal appearance of the thyroid gland. The trachea appears patent and is midline. Mild circumferential wall thickening throughout the esophagus noted. Right supraclavicular lymph node measures 1.3 cm, image 7/2. On 01/26/2018 1 cm and on 12/01/2018 1.3 cm. High right paratracheal lymph node measures 1.4 cm, image 13/2. Unchanged from 01/26/2018. Perihilar soft tissue measures 3.4 x 2.1 cm, image 64/2. Unchanged from 12/01/2018. On 01/26/2018 this measured 2.8 x 1.9 cm. Lungs/Pleura: Emphysema. No pleural effusion. Masslike architectural distortion, fibrosis and ground-glass attenuation within the right middle lobe and right upper lobe again noted most compatible with changes due to external beam radiation. No specific findings within this area to suggest tumor recurrence. No new suspicious pulmonary nodules or masses identified. Upper Abdomen: No acute abnormalities. Stable bilateral adrenal gland nodularity. Musculoskeletal: No chest wall abnormality. No acute or significant osseous findings. IMPRESSION: 1. Stable changes of external beam radiation identified within the right middle lobe  and right upper lobe. The recently described area of progressive thickness and consolidation has improved in the interval compatible with an inflammatory or infectious process. 2. Persistent enlarged right supraclavicular and high right paratracheal lymph nodes are identified. Right supraclavicular lymph node has increased in size when compared with 01/26/2018. The enlarged high right paratracheal lymph node has remains stable from 01/26/2018. This area may be better assessed with PET-CT to assess for residual nodal metastasis. 3. Right hilar soft tissue has progressed when compared with 01/26/2018 but is not significantly changed since more recent exam from 12/01/2018. This area  could also be better assessed with PET-CT. 4. Aortic Atherosclerosis (ICD10-I70.0) and Emphysema (ICD10-J43.9). Coronary artery calcifications. Electronically Signed   By: Kerby Moors M.D.   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  PLAN:    1. 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  He subsequently completed 1 year of maintenance durvalumab on August 27, 2018.  His most recent CT scan from March 04, 2019 reviewed independently and reported as above with no obvious evidence of recurrent or progressive disease.  The enlarged right paratracheal lymph node remains essentially unchanged.  No intervention is needed at this time.  Return to clinic in 6 months with repeat imaging and further evaluation.   2.  Hypertension: Chronic and unchanged.  Continue evaluation and treatment per primary care.  Patient expressed understanding and was in agreement with this plan. He also understands that He can call clinic at any time with any questions, concerns, or complaints.   Cancer Staging Squamous cell lung cancer, left [facility]) Staging form: Lung, AJCC 8th Edition - Clinical stage from 05/16/2017: Stage IIIB (cT3, cN2, cM0) - Signed by Lloyd Huger, MD on 05/16/2017   Lloyd Huger, MD   03/11/2019 3:59 PM

## 2019-03-10 ENCOUNTER — Encounter: Payer: Self-pay | Admitting: Oncology

## 2019-03-10 NOTE — Progress Notes (Signed)
Patient here today for follow up.  Patient denies any nausea, vomiting, diarrhea or constipation.   Patient c/o some pain occasionally and SOB

## 2019-03-11 ENCOUNTER — Inpatient Hospital Stay: Payer: BC Managed Care – PPO | Attending: Oncology | Admitting: Oncology

## 2019-03-11 ENCOUNTER — Other Ambulatory Visit: Payer: Self-pay

## 2019-03-11 ENCOUNTER — Encounter: Payer: Self-pay | Admitting: Oncology

## 2019-03-11 VITALS — BP 155/97 | HR 91 | Temp 96.3°F | Resp 20 | Wt 222.3 lb

## 2019-03-11 DIAGNOSIS — J45909 Unspecified asthma, uncomplicated: Secondary | ICD-10-CM | POA: Insufficient documentation

## 2019-03-11 DIAGNOSIS — C3492 Malignant neoplasm of unspecified part of left bronchus or lung: Secondary | ICD-10-CM | POA: Diagnosis not present

## 2019-03-11 DIAGNOSIS — Z9221 Personal history of antineoplastic chemotherapy: Secondary | ICD-10-CM | POA: Insufficient documentation

## 2019-03-11 DIAGNOSIS — Z923 Personal history of irradiation: Secondary | ICD-10-CM | POA: Diagnosis not present

## 2019-03-11 DIAGNOSIS — Z79899 Other long term (current) drug therapy: Secondary | ICD-10-CM | POA: Diagnosis not present

## 2019-03-11 DIAGNOSIS — F17210 Nicotine dependence, cigarettes, uncomplicated: Secondary | ICD-10-CM | POA: Insufficient documentation

## 2019-03-11 DIAGNOSIS — I10 Essential (primary) hypertension: Secondary | ICD-10-CM | POA: Insufficient documentation

## 2019-03-11 DIAGNOSIS — J449 Chronic obstructive pulmonary disease, unspecified: Secondary | ICD-10-CM | POA: Diagnosis not present

## 2019-03-11 DIAGNOSIS — I700 Atherosclerosis of aorta: Secondary | ICD-10-CM | POA: Insufficient documentation

## 2019-03-11 DIAGNOSIS — E119 Type 2 diabetes mellitus without complications: Secondary | ICD-10-CM | POA: Insufficient documentation

## 2019-03-25 ENCOUNTER — Ambulatory Visit
Admission: RE | Admit: 2019-03-25 | Discharge: 2019-03-25 | Disposition: A | Discharge status: Home / Self Care | Payer: BC Managed Care – PPO | Source: Ambulatory Visit | Attending: Radiation Oncology | Admitting: Radiation Oncology

## 2019-03-25 ENCOUNTER — Encounter: Payer: Self-pay | Admitting: Radiation Oncology

## 2019-03-25 ENCOUNTER — Other Ambulatory Visit: Payer: Self-pay | Admitting: *Deleted

## 2019-03-25 ENCOUNTER — Other Ambulatory Visit: Payer: Self-pay

## 2019-03-25 VITALS — BP 168/115 | HR 107 | Temp 97.4°F | Resp 20 | Wt 218.9 lb

## 2019-03-25 DIAGNOSIS — Z923 Personal history of irradiation: Secondary | ICD-10-CM | POA: Diagnosis not present

## 2019-03-25 DIAGNOSIS — Z85118 Personal history of other malignant neoplasm of bronchus and lung: Secondary | ICD-10-CM | POA: Insufficient documentation

## 2019-03-25 DIAGNOSIS — C3492 Malignant neoplasm of unspecified part of left bronchus or lung: Secondary | ICD-10-CM

## 2019-03-25 NOTE — Progress Notes (Signed)
Radiation Oncology Follow up Note  Name: Gabriel Mendez   Date:   03/25/2019 MRN:  [id number] DOB: Dec 15, 1960    This 58 y.o. male presents to the clinic today for 2-year follow-up status post concurrent chemoradiation therapy for stage IIIb squamous cell carcinoma of the left lung.  REFERRING PROVIDER: Arliss Journey*  HPI: Patient is a 58 year old male now about 2 years having completed concurrent chemoradiation therapy to his chest for stage IIIb squamous cell carcinoma of the left lung.  Seen today in routine follow-up he is doing well still has a mild nonproductive cough no hemoptysis or chest tightness.Marland Kitchen  He recently a CT scan of the chest which I have reviewed shows stable changes with external beam radiation therapy in the right middle lobe and right upper lobe.  Does have a persistent large right supraclavicular high right paratracheal lymph node.  Patient has completed maintenance  durvalumab   COMPLICATIONS OF TREATMENT: none Stage IIIb. FOLLOW UP COMPLIANCE: keeps appointments   PHYSICAL EXAM:  BP (!) 168/115 (BP Location: Left Arm, Patient Position: Sitting)   Pulse (!) 107   Temp (!) 97.4 F (36.3 C) (Tympanic)   Resp 20   Wt 218 lb 14.4 oz (99.3 kg)   BMI 32.80 kg/m  Well-developed well-nourished patient in NAD. HEENT reveals PERLA, EOMI, discs not visualized.  Oral cavity is clear. No oral mucosal lesions are identified. Neck is clear without evidence of cervical or supraclavicular adenopathy. Lungs are clear to A&P. Cardiac examination is essentially unremarkable with regular rate and rhythm without murmur rub or thrill. Abdomen is benign with no organomegaly or masses noted. Motor sensory and DTR levels are equal and symmetric in the upper and lower extremities. Cranial nerves II through XII are grossly intact. Proprioception is intact. No peripheral adenopathy or edema is identified. No motor or sensory levels are noted. Crude visual fields are within normal  range.  RADIOLOGY RESULTS: CT scans reviewed compatible with above-stated findings  PLAN: Present time patient has a stable chest.  He continues close follow-up care with medical oncology.  And pleased with his overall progress.  We will continue to observe the supraclavicular and paratracheal lymph nodes with PET CT in the future should that be indicated.  I have asked to see him back in 6 months for follow-up.  Patient knows to call sooner with any concerns.  I would like to take this opportunity to thank you for allowing me to participate in the care of your patient.[name], MD

## 2019-04-15 ENCOUNTER — Telehealth: Payer: Self-pay

## 2019-04-15 NOTE — Telephone Encounter (Signed)
Telephone call to patient to discuss SCP visit.  Patient in agreement for me to mail his treatment summary and SCP material and I will call him on Nov. 12th at 2:00 to review the material.   Packet mailed to patient.

## 2019-04-29 ENCOUNTER — Inpatient Hospital Stay: Payer: BC Managed Care – PPO | Attending: Oncology | Admitting: Oncology

## 2019-04-29 DIAGNOSIS — R252 Cramp and spasm: Secondary | ICD-10-CM

## 2019-04-29 NOTE — Progress Notes (Signed)
Survivorship Clinic Consult Note [facility]  [phone] [phone]  CLINIC:  Survivorship  REASON FOR VISIT:  Long-term survivorship surveillance visit for patient with history of squamous cell lung cancer.   BRIEF ONCOLOGIC HISTORY:  Oncology History  Squamous cell lung cancer, left ([place])  05/16/2017 Initial Diagnosis   Squamous cell lung cancer, left ([place])   09/11/2017 -  Chemotherapy   The patient had durvalumab (IMFINZI) 1,000 mg in sodium chloride 0.9 % 100 mL chemo infusion, 960 mg, Intravenous,  Once, 26 of 26 cycles Administration: 1,000 mg (09/11/2017), 1,000 mg (09/25/2017), 1,000 mg (12/04/2017), 1,000 mg (12/17/2017), 1,000 mg (01/01/2018), 1,000 mg (01/15/2018), 1,000 mg (02/12/2018), 1,000 mg (02/26/2018), 1,000 mg (03/12/2018), 1,000 mg (03/26/2018), 1,000 mg (04/09/2018), 1,000 mg (04/23/2018), 1,000 mg (05/07/2018), 1,000 mg (05/21/2018), 1,000 mg (06/04/2018), 1,000 mg (06/18/2018), 1,000 mg (07/02/2018), 1,000 mg (07/16/2018), 1,000 mg (07/30/2018), 1,000 mg (10/09/2017), 1,000 mg (10/23/2017), 1,000 mg (11/06/2017), 1,000 mg (11/20/2017), 1,000 mg (08/13/2018), 1,000 mg (08/27/2018)  for chemotherapy treatment.       INTERVAL HISTORY:  Patient was last evaluated by his primary medical oncologist Dr. Grayland Ormond on 03/11/2019 to discuss imaging results.  Initial treatment consisted of carbo/Taxol along with daily XRT.  He completed 1 year of maintenance nivolumab in March 2020.  His most recent CT from September 2020 reviewed independently and reported as above with no obvious evidence of recurrent or progressive disease.  He was scheduled for repeat imaging and assessment in approximately 6 months.  In the interim, he has been doing well. Notes some BLE and hand cramping mainly at bedtime. Eating healthy and exercising. Has occasional numbness in toe's. Admits to fatigue. Lives alone. Can complete all ADL's on his own.  He denies any chest pain,  shortness of breath, constipation or diarrhea.  He denies any pain.  He continues to work full-time.  ADDITIONAL REVIEW OF SYSTEMS:  Review of Systems  Constitutional: Positive for malaise/fatigue. Negative for chills, fever and weight loss.  HENT: Negative for congestion, ear pain and tinnitus.   Eyes: Negative.  Negative for blurred vision and double vision.  Respiratory: Negative.  Negative for cough, sputum production and shortness of breath.   Cardiovascular: Negative.  Negative for chest pain, palpitations and leg swelling.  Gastrointestinal: Negative.  Negative for abdominal pain, constipation, diarrhea, nausea and vomiting.  Genitourinary: Negative for dysuria, frequency and urgency.  Musculoskeletal: Negative for back pain and falls.       Cramping in BLE- at bedtime  Skin: Negative.  Negative for rash.  Neurological: Negative.  Negative for weakness and headaches.  Endo/Heme/Allergies: Negative.  Does not bruise/bleed easily.  Psychiatric/Behavioral: Negative.  Negative for depression. The patient is not nervous/anxious and does not have insomnia.     PAST MEDICAL & SURGICAL HISTORY:  Past Medical History:  Diagnosis Date   Asthma    COPD (chronic obstructive pulmonary disease) ([place])    Diabetes mellitus without complication ([place])    Hypertension    Lung cancer ([place]) 05/2017   Hx Chemo + rad tx's.   Pneumonia    Past Surgical History:  Procedure Laterality Date   ENDOBRONCHIAL ULTRASOUND N/A 05/12/2017   Procedure: ENDOBRONCHIAL ULTRASOUND;  Surgeon: Laverle Hobby, MD;  Location: [facility] ORS;  Service: Pulmonary;  Laterality: N/A;   FRACTURE SURGERY Left    ANKLE X 2   TONSILLECTOMY      SOCIAL HISTORY:  Relationships  Social connections   Talks on phone: Not on file  Gets together: Not on file   Attends religious service: Not on file   Active member of club or organization: Not on file   Attends meetings of clubs or organizations: Not on  file   Relationship status: Not on file      CURRENT MEDICATIONS:  Current Outpatient Medications on File Prior to Visit  Medication Sig Dispense Refill   albuterol (PROVENTIL) (2.5 MG/3ML) 0.083% nebulizer solution Take 2.5 mg every 6 (six) hours as needed by nebulization for wheezing or shortness of breath.     albuterol (VENTOLIN HFA) 108 (90 Base) MCG/ACT inhaler Inhale 2 puffs into the lungs every 6 (six) hours as needed for wheezing or shortness of breath. 1 Inhaler 0   ANORO ELLIPTA 62.5-25 MCG/INH AEPB INHALE 1 PUFF INTO THE LUNGS DAILY 60 each 2   atorvastatin (LIPITOR) 20 MG tablet Take 20 mg by mouth daily.     beclomethasone (QVAR) 80 MCG/ACT inhaler Inhale 1 puff into the lungs 2 (two) times daily.     glipiZIDE (GLUCOTROL) 5 MG tablet TK 1 T PO QD     ibuprofen (ADVIL,MOTRIN) 200 MG tablet Take 400-800 mg every 8 (eight) hours as needed by mouth (for pain.).     lisinopril-hydrochlorothiazide (ZESTORETIC) 20-25 MG tablet Take 1 tablet by mouth daily. 30 tablet 0   metFORMIN (GLUCOPHAGE) 1000 MG tablet Take 1 tablet (1,000 mg total) by mouth 2 (two) times daily. 60 tablet 0   omeprazole (PRILOSEC) 20 MG capsule TAKE 1 CAPSULE(20 MG) BY MOUTH DAILY 30 capsule 2   [DISCONTINUED] prochlorperazine (COMPAZINE) 10 MG tablet Take 1 tablet (10 mg total) by mouth every 6 (six) hours as needed (Nausea or vomiting). 60 tablet 2   No current facility-administered medications on file prior to visit.     ALLERGIES:  Allergies  Allergen Reactions   Shrimp [Shellfish Allergy] Nausea And Vomiting    PHYSICAL EXAM:  There were no vitals filed for this visit. There were no vitals filed for this visit.  Limited d/t telephone visit.   LABORATORY DATA:  Lab Results  Component Value Date   WBC 11.2 (H) 12/01/2018   HGB 15.6 12/01/2018   HCT 46.9 12/01/2018   MCV 86.7 12/01/2018   PLT 271 12/01/2018   DIAGNOSTIC IMAGING:  CT chest 03/04/19  IMPRESSION: 1. Stable  changes of external beam radiation identified within the right middle lobe and right upper lobe. The recently described area of progressive thickness and consolidation has improved in the interval compatible with an inflammatory or infectious process. 2. Persistent enlarged right supraclavicular and high right paratracheal lymph nodes are identified. Right supraclavicular lymph node has increased in size when compared with 01/26/2018. The enlarged high right paratracheal lymph node has remains stable from 01/26/2018. This area may be better assessed with PET-CT to assess for residual nodal metastasis. 3. Right hilar soft tissue has progressed when compared with 01/26/2018 but is not significantly changed since more recent exam from 12/01/2018. This area could also be better assessed with PET-CT. 4. Aortic Atherosclerosis (ICD10-I70.0) and Emphysema (ICD10-J43.9). Coronary artery calcifications.  ASSESSMENT & PLAN:  Mr. Falls is a pleasant 58 y.o. male with history of Stage IIIb squamous cell carcinoma of left lung , treated with carbo/taxol/xrt and maintenance durvalumab; completed treatment on 08/27/2018.  Patient presents to survivorship clinic today for survivorship care plan visit and to address any acute survivorship concerns since completing treatment.    1. History of Lung cancer: Clinically, he is without evidence of disease recurrence  based on physical exam/diagnostic imaging.  Today, he received a copy of his survivorship care plan (SCP) document, which was reviewed with him in detail.  The SCP details his cancer treatment history and potential late/long-term side effects of those treatments.  We discussed the follow-up schedule he can anticipate with interval imaging for surveillance of his cancer.  I have also shared a copy of his treatment summary/SCP with his PCP.  Mr. Timothy will return to the survivorship clinic as needed; he will return to [place] at [facility] for  surveillance visit with Dr. Grayland Ormond in 5 months.    2. Problem at visit:   Cramping feet/hands at bedtime: Common with electrolyte abnormalities. Recommend lab work to see if he is deficient in potassium, magnesium or others.  Patient would like to trial a multivitamin that contains both magnesium and potassium to see if symptoms resolve.  Orders placed for CBC and CMP if he would like lab work in the future.  Fatigue: Likely due to treatment and history of lung cancer.  He continues to be able to complete all of his ADLs on his own.  He works full-time.  No interventions at this time.  3. Smoking cessation: I commended Mr. Muldrew's continued efforts to remain tobacco-free.  We discussed that one of the most important risk reduction strategies in preventing cancer recurrence in lung cancer patients is smoking cessation.  He is committed to abstaining from tobacco.  4. Physical activity/Healthy eating: Getting adequate physical activity and maintaining a healthy diet as a cancer survivor is important for overall wellness and reduces the risk of cancer recurrence. We discussed the CARE program which is a fitness program that is offered to cancer survivors free of charge.  We also reviewed the American Cancer Society's booklet with recommendations for nutrition and physical activity.    4. Health promotion/Cancer screening:  Mr. Rama is reportedly up-to-date on his colonoscopy, PSA tests, skin screenings, and vaccinations.  I encouraged him to talk with his PCP about arranging appropriate cancer screening tests, as appropriate.   5. Support services/Counseling: Mr. Hasz was seen today in in effort to address both the physical and social concerns of our cancer survivors at [facility]. It is not uncommon for this period of the patient's cancer care trajectory to be one of many emotions and stressors.  I provided support today through active listening, validation of concerns, and  expressive supportive counseling.  Mr. Golubski was encouraged to take advantage of our support services programs and support groups to better cope in his new life as a cancer survivor after completing anti-cancer treatment.   Dispo:  -Pick up multivitamin to see if this helps improve his cramping (may have low mag/potassium levels). Suggested lab work but he would like to trial a multivitamin to see if this corrects the problem. -RTC in March after CT scans for labs, md assessment and reults. -Return to survivorship clinic as needed; no additional follow-up needed at this time.  -Consider transitioning the patient to long-term survivorship, when clinically appropriate.   A total of 30 minutes was spent in face-to-face care of this patient, with greater than 50% of that time spent in counseling and care coordination.   Rulon Abide, AGNP-C [place] [facility] [place] (office) 04/29/19 2:07 PM

## 2019-04-29 NOTE — Progress Notes (Signed)
Survivorship Care Plan visit completed.  Treatment summary reviewed and previously mailed to patient.  ASCO answers booklet reviewed and given to patient.  CARE program and Cancer Transitions discussed with patient along with other resources cancer center offers to patients and caregivers.  Patient verbalized understanding.

## 2019-05-24 ENCOUNTER — Inpatient Hospital Stay: Payer: BC Managed Care – PPO | Attending: Oncology | Admitting: Oncology

## 2019-05-24 ENCOUNTER — Other Ambulatory Visit: Payer: Self-pay | Admitting: Oncology

## 2019-05-24 ENCOUNTER — Other Ambulatory Visit: Payer: Self-pay

## 2019-05-24 DIAGNOSIS — Z923 Personal history of irradiation: Secondary | ICD-10-CM | POA: Diagnosis not present

## 2019-05-24 DIAGNOSIS — Z7984 Long term (current) use of oral hypoglycemic drugs: Secondary | ICD-10-CM | POA: Insufficient documentation

## 2019-05-24 DIAGNOSIS — I10 Essential (primary) hypertension: Secondary | ICD-10-CM

## 2019-05-24 DIAGNOSIS — Z8673 Personal history of transient ischemic attack (TIA), and cerebral infarction without residual deficits: Secondary | ICD-10-CM | POA: Diagnosis not present

## 2019-05-24 DIAGNOSIS — Z9221 Personal history of antineoplastic chemotherapy: Secondary | ICD-10-CM | POA: Insufficient documentation

## 2019-05-24 DIAGNOSIS — Z85118 Personal history of other malignant neoplasm of bronchus and lung: Secondary | ICD-10-CM | POA: Insufficient documentation

## 2019-05-24 DIAGNOSIS — E119 Type 2 diabetes mellitus without complications: Secondary | ICD-10-CM | POA: Insufficient documentation

## 2019-05-24 DIAGNOSIS — J449 Chronic obstructive pulmonary disease, unspecified: Secondary | ICD-10-CM | POA: Insufficient documentation

## 2019-05-24 DIAGNOSIS — Z79899 Other long term (current) drug therapy: Secondary | ICD-10-CM | POA: Diagnosis not present

## 2019-05-24 DIAGNOSIS — F17210 Nicotine dependence, cigarettes, uncomplicated: Secondary | ICD-10-CM | POA: Insufficient documentation

## 2019-05-24 NOTE — Progress Notes (Signed)
Symptom Management Consult note [facility]  [phone] [phone]) [phone]  Patient Care Team: Gunnar Bulla as PCP - General (Physician Assistant) Lloyd Huger, MD as Consulting Physician (Oncology) [name], MD as Referring Physician (Radiation Oncology) Telford Nab, RN as Registered Nurse   Name of the patient: Gabriel Mendez  [id number]  01/02/1961   Date of visit: 05/24/2019   Diagnosis- Lung Cancer   Chief complaint/ Reason for visit- Hypertension  Heme/Onc history:  Oncology History  Squamous cell lung cancer, left ([place])  05/16/2017 Initial Diagnosis   Squamous cell lung cancer, left ([place])   09/11/2017 -  Chemotherapy   The patient had durvalumab (IMFINZI) 1,000 mg in sodium chloride 0.9 % 100 mL chemo infusion, 960 mg, Intravenous,  Once, 26 of 26 cycles Administration: 1,000 mg (09/11/2017), 1,000 mg (09/25/2017), 1,000 mg (12/04/2017), 1,000 mg (12/17/2017), 1,000 mg (01/01/2018), 1,000 mg (01/15/2018), 1,000 mg (02/12/2018), 1,000 mg (02/26/2018), 1,000 mg (03/12/2018), 1,000 mg (03/26/2018), 1,000 mg (04/09/2018), 1,000 mg (04/23/2018), 1,000 mg (05/07/2018), 1,000 mg (05/21/2018), 1,000 mg (06/04/2018), 1,000 mg (06/18/2018), 1,000 mg (07/02/2018), 1,000 mg (07/16/2018), 1,000 mg (07/30/2018), 1,000 mg (10/09/2017), 1,000 mg (10/23/2017), 1,000 mg (11/06/2017), 1,000 mg (11/20/2017), 1,000 mg (08/13/2018), 1,000 mg (08/27/2018)  for chemotherapy treatment.      Interval history-patient presents to symptom management today for complaints of elevated blood pressure.  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  He is currently taking lisinopril-hydrochlorothiazide as prescribed by PCP Dr. Clemmie Krill.  He was unable to get an appointment with her and needs to get his blood pressure under control so he can work.  He typically takes his blood pressure medicine in the mornings and checks his blood  pressure throughout the day.  He has never noted a blood pressure as high as it was during his exam.  He denies any neurological complaints, headaches or confusion.  He denies any fevers or illness, easy bleeding or bruising, chest pain, nausea, vomiting, constipation or diarrhea.  ECOG FS:0 - Asymptomatic  Review of systems- Review of Systems  Constitutional: Negative.  Negative for chills, fever, malaise/fatigue and weight loss.  HENT: Negative for congestion, ear pain and tinnitus.   Eyes: Negative.  Negative for blurred vision and double vision.  Respiratory: Negative.  Negative for cough, sputum production and shortness of breath.   Cardiovascular: Negative.  Negative for chest pain, palpitations and leg swelling.  Gastrointestinal: Negative.  Negative for abdominal pain, constipation, diarrhea, nausea and vomiting.  Genitourinary: Negative for dysuria, frequency and urgency.  Musculoskeletal: Negative for back pain and falls.  Skin: Negative.  Negative for rash.  Neurological: Negative.  Negative for weakness and headaches.  Endo/Heme/Allergies: Negative.  Does not bruise/bleed easily.  Psychiatric/Behavioral: Negative.  Negative for depression. The patient is not nervous/anxious and does not have insomnia.      Current treatment-surveillance  Allergies  Allergen Reactions  . Shrimp [Shellfish Allergy] Nausea And Vomiting     Past Medical History:  Diagnosis Date  . Asthma   . COPD (chronic obstructive pulmonary disease) ([place])   . Diabetes mellitus without complication ([place])   . Hypertension   . Lung cancer ([place]) 05/2017   Hx Chemo + rad tx's.  . Pneumonia      Past Surgical History:  Procedure Laterality Date  . ENDOBRONCHIAL ULTRASOUND N/A 05/12/2017   Procedure: ENDOBRONCHIAL ULTRASOUND;  Surgeon: Laverle Hobby, MD;  Location: [facility] ORS;  Service:  Pulmonary;  Laterality: N/A;  . FRACTURE SURGERY Left    ANKLE X 2  . TONSILLECTOMY      Social History    Socioeconomic History  . Marital status: Divorced    Spouse name: Not on file  . Number of children: Not on file  . Years of education: Not on file  . Highest education level: Not on file  Occupational History  . Not on file  Social Needs  . Financial resource strain: Not on file  . Food insecurity    Worry: Not on file    Inability: Not on file  . Transportation needs    Medical: Not on file    Non-medical: Not on file  Tobacco Use  . Smoking status: Current Some Day Smoker    Packs/day: 0.50  . Smokeless tobacco: Never Used  Substance and Sexual Activity  . Alcohol use: No  . Drug use: No  . Sexual activity: Not on file  Lifestyle  . Physical activity    Days per week: Not on file    Minutes per session: Not on file  . Stress: Not on file  Relationships  . Social Herbalist on phone: Not on file    Gets together: Not on file    Attends religious service: Not on file    Active member of club or organization: Not on file    Attends meetings of clubs or organizations: Not on file    Relationship status: Not on file  . Intimate partner violence    Fear of current or ex partner: Not on file    Emotionally abused: Not on file    Physically abused: Not on file    Forced sexual activity: Not on file  Other Topics Concern  . Not on file  Social History Narrative  . Not on file    Family History  Problem Relation Age of Onset  . Stroke Mother   . Diabetes Mother   . Hypertension Mother   . Diabetes Father   . Hypertension Father   . Diabetes Sister   . Diabetes Brother   . Heart attack Paternal Uncle   . Stroke Maternal Grandmother      Current Outpatient Medications:  .  albuterol (PROVENTIL) (2.5 MG/3ML) 0.083% nebulizer solution, Take 2.5 mg every 6 (six) hours as needed by nebulization for wheezing or shortness of breath., Disp: , Rfl:  .  albuterol (VENTOLIN HFA) 108 (90 Base) MCG/ACT inhaler, Inhale 2 puffs into the lungs every 6 (six) hours as  needed for wheezing or shortness of breath., Disp: 1 Inhaler, Rfl: 0 .  ANORO ELLIPTA 62.5-25 MCG/INH AEPB, INHALE 1 PUFF INTO THE LUNGS DAILY, Disp: 60 each, Rfl: 2 .  atorvastatin (LIPITOR) 20 MG tablet, Take 20 mg by mouth daily., Disp: , Rfl:  .  beclomethasone (QVAR) 80 MCG/ACT inhaler, Inhale 1 puff into the lungs 2 (two) times daily., Disp: , Rfl:  .  glipiZIDE (GLUCOTROL) 5 MG tablet, TK 1 T PO QD, Disp: , Rfl:  .  ibuprofen (ADVIL,MOTRIN) 200 MG tablet, Take 400-800 mg every 8 (eight) hours as needed by mouth (for pain.)., Disp: , Rfl:  .  lisinopril-hydrochlorothiazide (ZESTORETIC) 20-25 MG tablet, Take 1 tablet by mouth daily., Disp: 30 tablet, Rfl: 0 .  metFORMIN (GLUCOPHAGE) 1000 MG tablet, Take 1 tablet (1,000 mg total) by mouth 2 (two) times daily., Disp: 60 tablet, Rfl: 0 .  omeprazole (PRILOSEC) 20 MG capsule, TAKE 1 CAPSULE(20  MG) BY MOUTH DAILY, Disp: 30 capsule, Rfl: 2  Physical exam: There were no vitals filed for this visit. Physical Exam Constitutional:      Appearance: Normal appearance.  HENT:     Head: Normocephalic and atraumatic.  Eyes:     Pupils: Pupils are equal, round, and reactive to light.  Neck:     Musculoskeletal: Normal range of motion.  Cardiovascular:     Rate and Rhythm: Normal rate and regular rhythm.     Heart sounds: Normal heart sounds. No murmur.  Pulmonary:     Effort: Pulmonary effort is normal.     Breath sounds: Normal breath sounds. No wheezing.  Abdominal:     General: Bowel sounds are normal. There is no distension.     Palpations: Abdomen is soft.     Tenderness: There is no abdominal tenderness.  Musculoskeletal: Normal range of motion.  Skin:    General: Skin is warm and dry.     Findings: No rash.  Neurological:     Mental Status: He is alert and oriented to person, place, and time.  Psychiatric:        Judgment: Judgment normal.      CMP Latest Ref Rng & Units 03/04/2019  Glucose 70 - 99 mg/dL -  BUN 6 - 20 mg/dL -   Creatinine 0.61 - 1.24 mg/dL 0.70  Sodium 135 - 145 mmol/L -  Potassium 3.5 - 5.1 mmol/L -  Chloride 98 - 111 mmol/L -  CO2 22 - 32 mmol/L -  Calcium 8.9 - 10.3 mg/dL -  Total Protein 6.5 - 8.1 g/dL -  Total Bilirubin 0.3 - 1.2 mg/dL -  Alkaline Phos 38 - 126 U/L -  AST 15 - 41 U/L -  ALT 0 - 44 U/L -   CBC Latest Ref Rng & Units 12/01/2018  WBC 4.0 - 10.5 K/uL 11.2(H)  Hemoglobin 13.0 - 17.0 g/dL 15.6  Hematocrit 39.0 - 52.0 % 46.9  Platelets 150 - 400 K/uL 271    No images are attached to the encounter.  No results found.   Assessment and plan- Patient is a 58 y.o. male who presents to symptom management for uncontrolled blood pressures.  Lung cancer: Treatment consisted of carbo/Taxol along with daily XRT.  He completed 1 year of maintenance nivolumab in March 2020.  Most recent CT from September 2020 revealed no evidence of recurrent or progressive disease.  Scheduled for repeat imaging in approximately 6 months.  Uncontrolled blood pressure: Currently on lisinopril/hydrochlorothiazide 20-25 mg daily.  He takes this in the morning.  Plan: Check blood pressure. Blood pressure readings are as follows: 160/85 140/92 147/99  I do not feel comfortable adjusting his blood pressure medicines given they were okay while in clinic.  Recommend he try taking his blood pressure medicines at bedtime and see if that makes his morning blood pressures better.  Prior to next DOT health examination, make sure he sits for several minutes prior to read and does not talk while having his blood pressure taken.  There was a significant difference in his initial versus after he had rested for several minutes.  Disposition: Return to clinic as scheduled next year to see Dr. Grayland Ormond Continue current blood pressure medications.   Visit Diagnosis 1. Hypertension, unspecified type     Patient expressed understanding and was in agreement with this plan. He also understands that He can call  clinic at any time with any questions, concerns, or complaints.   Greater  than 50% was spent in counseling and coordination of care with this patient including but not limited to discussion of the relevant topics above (See A&P) including, but not limited to diagnosis and management of acute and chronic medical conditions.   Thank you for allowing me to participate in the care of this very pleasant patient.    Jacquelin Hawking, NP [place] at [facility] Cell - [phone] Pager- [phone] 05/24/2019 3:43 PM

## 2019-06-19 IMAGING — CT NM PET TUM IMG INITIAL (PI) SKULL BASE T - THIGH
9 series · 22 of 25 positions shown · non-contrast
Comparison: Chest CT 04/28/2017

CLINICAL DATA: Initial treatment strategy for RIGHT upper lobe
consolidation.. RIGHT upper lobe squamous cell carcinoma.

EXAM:
NUCLEAR MEDICINE PET SKULL BASE TO THIGH
TECHNIQUE: 13.1 mCi F-18 FDG was injected intravenously. Full-ring PET imaging
was performed from the skull base to thigh after the radiotracer. CT
data was obtained and used for attenuation correction and anatomic
localization.
FASTING BLOOD GLUCOSE:  Value: 148 mg/dl

[Series 3: ct wb 5.0 b30f · axial · 5.0mm · 0.98mm/px · z∈[-1534,-796]mm · 4 of 329 slices shown]
[im 1/329]
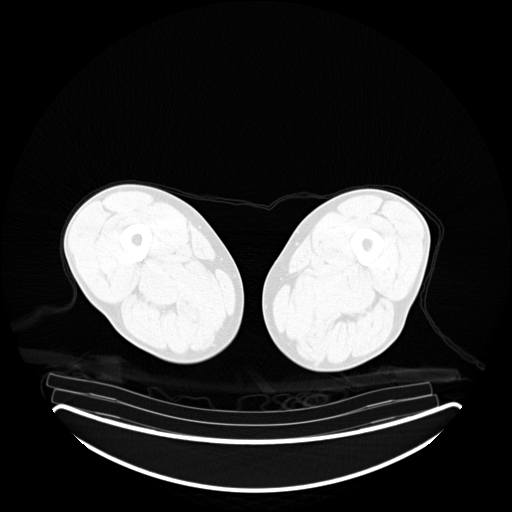
[im 83/329]
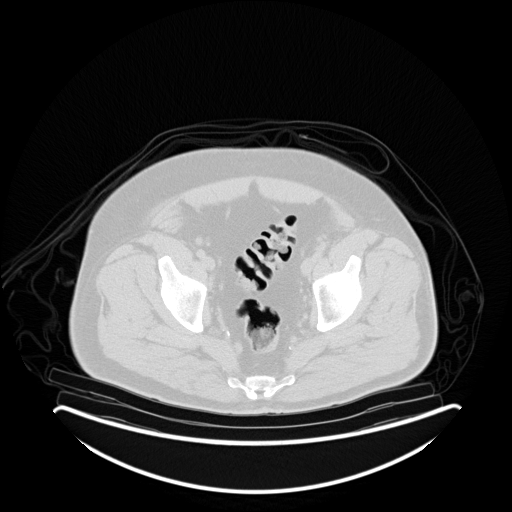
[im 165/329]
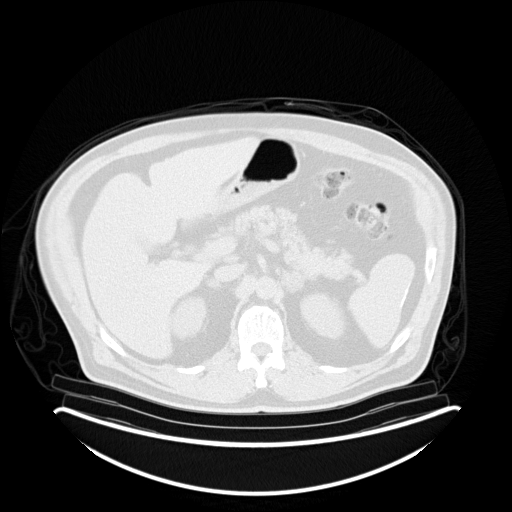
[im 247/329]
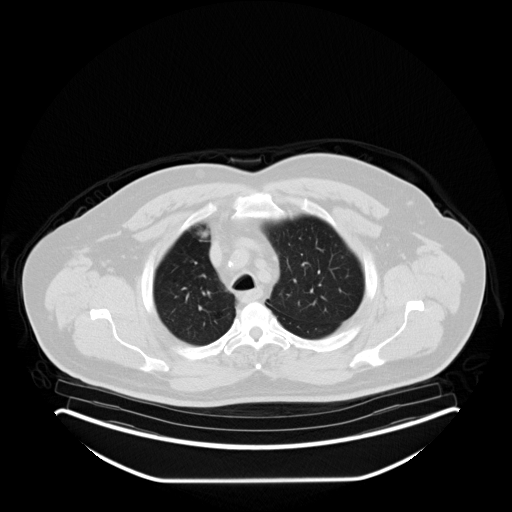

[Series 5: pet wb uncorrected (nac) · axial · 5.0mm · 4.07mm/px · z∈[-1534,-550]mm · 5 of 329 slices shown]
[im 1/329]
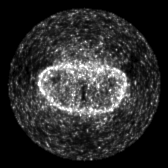
[im 83/329]
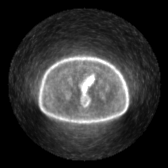
[im 165/329]
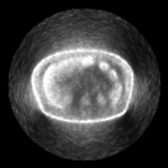
[im 247/329]
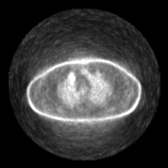
[im 329/329]
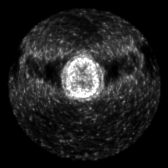

[Series 603: pet axial fused · 3 of 324 slices shown]
[im 1/324]
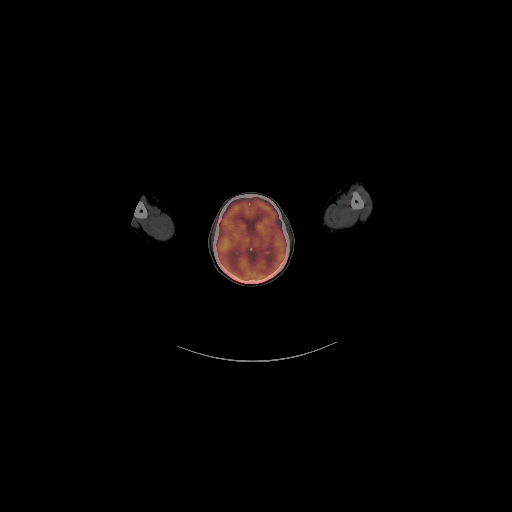
[im 108/324]
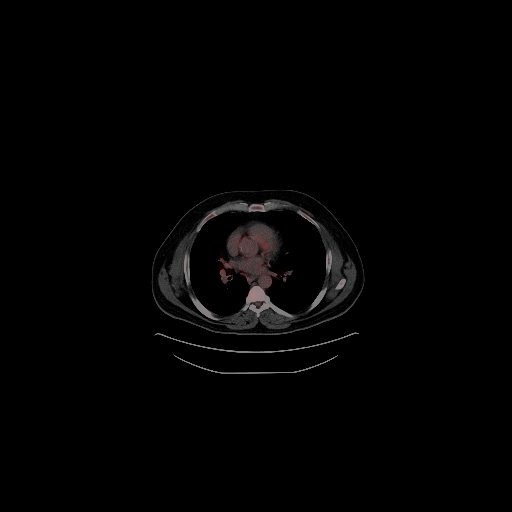
[im 324/324]
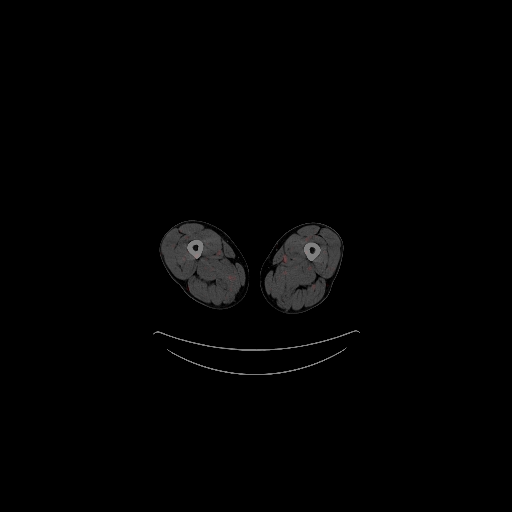

[Series 604: pet coronal fused · 1 of 95 slices shown]
[im 1/95]
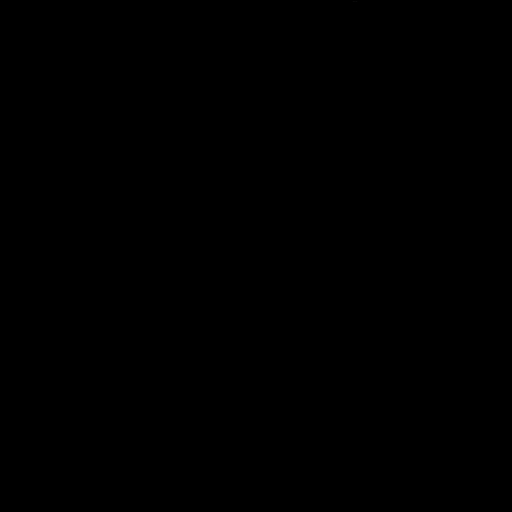

[Series 606: pet sagittal fused · 2 of 158 slices shown]
[im 1/158]
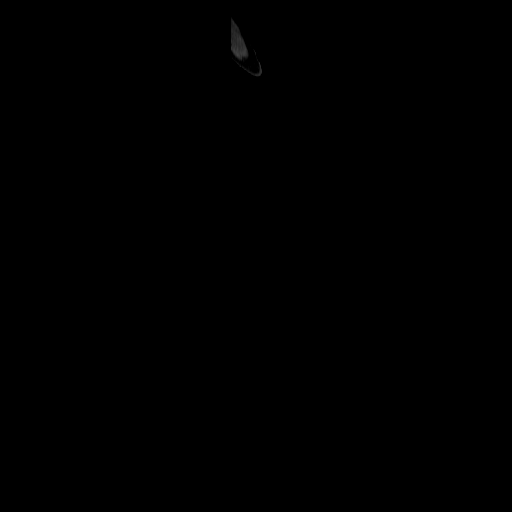
[im 158/158]
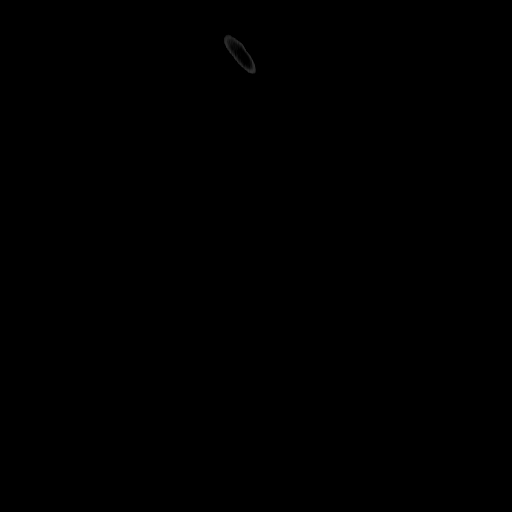

[Series 607: pet axial · 3 of 327 slices shown]
[im 1/327]
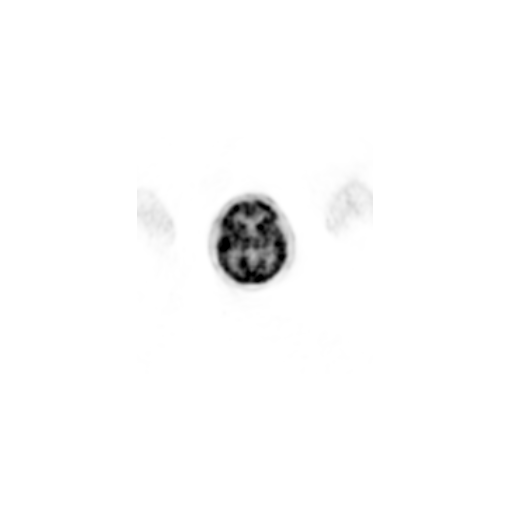
[im 109/327]
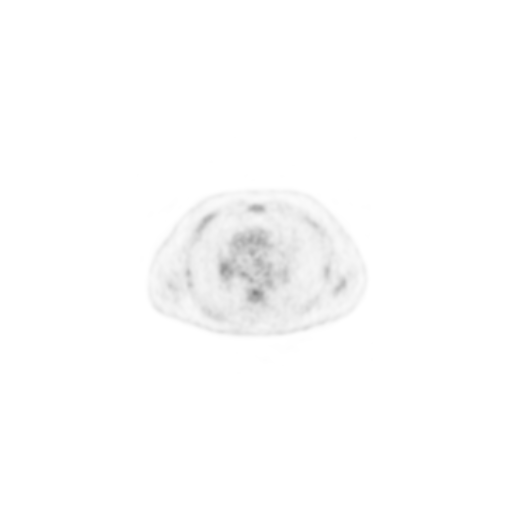
[im 218/327]
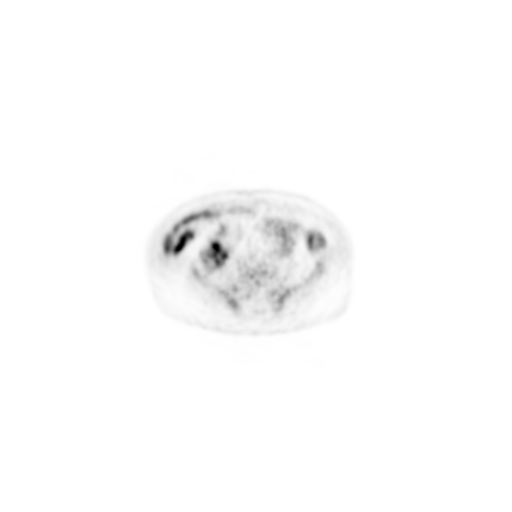

[Series 608: pet coronal · 1 of 98 slices shown]
[im 1/98]
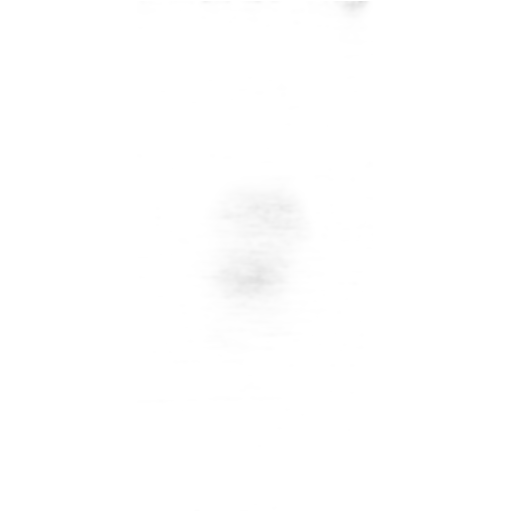

[Series 609: pet sagittal · 2 of 152 slices shown]
[im 1/152]
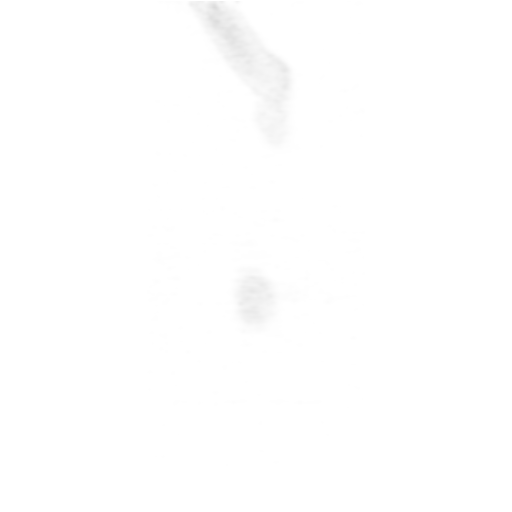
[im 152/152]
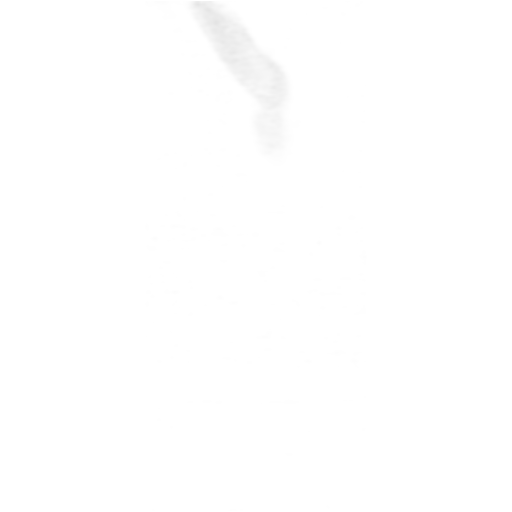

[Series 1031: results mm oncology reading · 0.89mm/px · 1 of 5 slices shown]
[im 1/5]
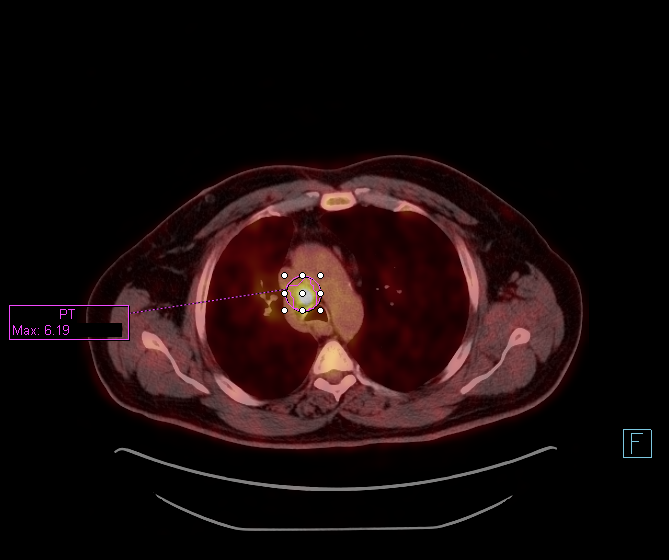

[22 of 25 positions shown; findings below may reference images not displayed]

FINDINGS: NECK

Hypermetabolic nodule within the posterior aspect of the LEFT
parotid gland SUV max equals 6.7. This nodule is mildly hyper dense
on noncontrast CT measuring 13 mm (image 27, series 3).

No hypermetabolic lymph nodes in the neck.

CHEST

Hypermetabolic RIGHT hilar mass difficult to measure on this
noncontrast CT but the metabolic portion measures approximately
cm. This lesion is intensely metabolic with SUV max equal 11.2.

There is partial postobstructive collapse in the RIGHT upper lobe
with mild metabolic activity.

There is a hypermetabolic subcarinal and RIGHT lower paratracheal
lymph node. RIGHT lower paratracheal lymph node with SUV max equals
6.2.

There no contralateral hypermetabolic nodes. Nodes hypermetabolic
supraclavicular nodes. No additional hypermetabolic pulmonary
nodules per

ABDOMEN/PELVIS

Benign adrenal adenoma of the LEFT adrenal gland. No abnormal
metabolic activity liver. Pancreas, spleen, kidneys are normal.

Intense uptake in the bowel associated with metformin. No
hypermetabolic abdominopelvic lymph nodes.

SKELETON

No focal hypermetabolic activity to suggest skeletal metastasis.
IMPRESSION: 1. Hypermetabolic RIGHT hilar mass with partial postobstructive
collapse of the RIGHT upper lobe consists with bronchogenic
carcinoma.
2. Subcarinal and ipsilateral nodal metastasis.
3. No evidence distant metastatic disease.
4. FDG PET scan staging T2b N2 M0
5. Hypermetabolic LEFT parotid nodule. Differential includes benign
and malignant parotid neoplasms with pleomorphic adenoma favored.
Consider ENT consultation.

## 2019-08-27 ENCOUNTER — Other Ambulatory Visit: Payer: Self-pay

## 2019-08-27 ENCOUNTER — Ambulatory Visit (INDEPENDENT_AMBULATORY_CARE_PROVIDER_SITE_OTHER): Payer: 59

## 2019-08-27 ENCOUNTER — Ambulatory Visit
Admission: EM | Admit: 2019-08-27 | Discharge: 2019-08-27 | Disposition: A | Discharge status: Home / Self Care | Payer: 59 | Attending: Family Medicine | Admitting: Family Medicine

## 2019-08-27 DIAGNOSIS — R0602 Shortness of breath: Secondary | ICD-10-CM | POA: Diagnosis not present

## 2019-08-27 DIAGNOSIS — F17210 Nicotine dependence, cigarettes, uncomplicated: Secondary | ICD-10-CM | POA: Insufficient documentation

## 2019-08-27 DIAGNOSIS — Z7984 Long term (current) use of oral hypoglycemic drugs: Secondary | ICD-10-CM | POA: Insufficient documentation

## 2019-08-27 DIAGNOSIS — Z20822 Contact with and (suspected) exposure to covid-19: Secondary | ICD-10-CM | POA: Diagnosis not present

## 2019-08-27 DIAGNOSIS — I10 Essential (primary) hypertension: Secondary | ICD-10-CM | POA: Insufficient documentation

## 2019-08-27 DIAGNOSIS — R05 Cough: Secondary | ICD-10-CM

## 2019-08-27 DIAGNOSIS — E119 Type 2 diabetes mellitus without complications: Secondary | ICD-10-CM | POA: Diagnosis not present

## 2019-08-27 DIAGNOSIS — Z79899 Other long term (current) drug therapy: Secondary | ICD-10-CM | POA: Insufficient documentation

## 2019-08-27 DIAGNOSIS — J441 Chronic obstructive pulmonary disease with (acute) exacerbation: Secondary | ICD-10-CM | POA: Diagnosis present

## 2019-08-27 LAB — INFLUENZA PANEL BY PCR (TYPE A & B)
Influenza A By PCR: NEGATIVE
Influenza B By PCR: NEGATIVE

## 2019-08-27 LAB — SARS CORONAVIRUS 2 AG (30 MIN TAT): SARS Coronavirus 2 Ag: NEGATIVE

## 2019-08-27 MED ORDER — PREDNISONE 50 MG PO TABS
60.0000 mg | ORAL_TABLET | Freq: Once | ORAL | Status: AC
  Administered 2019-08-27: 20:00:00 60 mg via ORAL

## 2019-08-27 MED ORDER — PREDNISONE 20 MG PO TABS: ORAL_TABLET | ORAL | 0 refills | Status: DC

## 2019-08-27 NOTE — ED Triage Notes (Signed)
Patient complains of cough, body aches, shortness of breath and fever since last night.

## 2019-08-28 LAB — SARS CORONAVIRUS 2 (TAT 6-24 HRS): SARS Coronavirus 2: NEGATIVE

## 2019-08-28 NOTE — ED Provider Notes (Signed)
[facility]    CSN: [id number] Arrival date & time: 08/27/19  1823      History   Chief Complaint Chief Complaint  Patient presents with  . Cough    HPI GARET HOOTON is a 59 y.o. male.   59 yo male with a c/o cough, body aches, wheezing, shortness of breath since last night. States he felt feverish. Denies any chest pains. Patient has a h/o COPD and lung cancer.    Cough   Past Medical History:  Diagnosis Date  . Asthma   . COPD (chronic obstructive pulmonary disease) ([place])   . Diabetes mellitus without complication ([place])   . Hypertension   . Lung cancer ([place]) 05/2017   Hx Chemo + rad tx's.  . Pneumonia     Patient Active Problem List   Diagnosis Date Noted  . Goals of care, counseling/discussion 05/16/2017  . Squamous cell lung cancer, left ([place]) 05/16/2017    Past Surgical History:  Procedure Laterality Date  . ENDOBRONCHIAL ULTRASOUND N/A 05/12/2017   Procedure: ENDOBRONCHIAL ULTRASOUND;  Surgeon: Laverle Hobby, MD;  Location: [facility] ORS;  Service: Pulmonary;  Laterality: N/A;  . FRACTURE SURGERY Left    ANKLE X 2  . TONSILLECTOMY         Home Medications    Prior to Admission medications   Medication Sig Start Date End Date Taking? Authorizing Provider  albuterol (PROVENTIL) (2.5 MG/3ML) 0.083% nebulizer solution Take 2.5 mg every 6 (six) hours as needed by nebulization for wheezing or shortness of breath.   Yes [provider]  albuterol (VENTOLIN HFA) 108 (90 Base) MCG/ACT inhaler Inhale 2 puffs into the lungs every 6 (six) hours as needed for wheezing or shortness of breath. 11/21/18  Yes Karen Kitchens, NP  ANORO ELLIPTA 62.5-25 MCG/INH AEPB INHALE 1 PUFF INTO THE LUNGS DAILY 10/12/18  Yes Laverle Hobby, MD  atorvastatin (LIPITOR) 20 MG tablet Take 20 mg by mouth daily. 02/09/19  Yes [provider]  beclomethasone (QVAR) 80 MCG/ACT inhaler Inhale 1 puff into the lungs 2 (two) times daily.   Yes [provider]  glipiZIDE (GLUCOTROL) 5 MG tablet TK 1 T PO QD 03/22/19  Yes [provider]  ibuprofen (ADVIL,MOTRIN) 200 MG tablet Take 400-800 mg every 8 (eight) hours as needed by mouth (for pain.).   Yes [provider]  lisinopril-hydrochlorothiazide (ZESTORETIC) 20-25 MG tablet Take 1 tablet by mouth daily. 11/21/18  Yes Karen Kitchens, NP  metFORMIN (GLUCOPHAGE) 1000 MG tablet Take 1 tablet (1,000 mg total) by mouth 2 (two) times daily. 11/21/18  Yes Karen Kitchens, NP  omeprazole (PRILOSEC) 20 MG capsule TAKE 1 CAPSULE(20 MG) BY MOUTH DAILY 10/12/18  Yes Lloyd Huger, MD  predniSONE (DELTASONE) 20 MG tablet 3 tabs po once day 1, then 2 tabs po qd x 3 days, then 1 tab po qd x 3 days, then half a tab po qd x 2 days 08/27/19   Norval Gable, MD  prochlorperazine (COMPAZINE) 10 MG tablet Take 1 tablet (10 mg total) by mouth every 6 (six) hours as needed (Nausea or vomiting). 05/30/17 09/05/17  Lloyd Huger, MD    Family History Family History  Problem Relation Age of Onset  . Stroke Mother   . Diabetes Mother   . Hypertension Mother   . Diabetes Father   . Hypertension Father   . Diabetes Sister   . Diabetes Brother   . Heart attack Paternal Uncle   .  Stroke Maternal Grandmother     Social History Social History   Tobacco Use  . Smoking status: Current Some Day Smoker    Packs/day: 0.50  . Smokeless tobacco: Never Used  Substance Use Topics  . Alcohol use: No  . Drug use: No     Allergies   Shrimp [shellfish allergy]   Review of Systems Review of Systems  Respiratory: Positive for cough.      Physical Exam Triage Vital Signs ED Triage Vitals  Enc Vitals Group     BP 08/27/19 1834 116/88     Pulse Rate 08/27/19 1834 (!) 120     Resp 08/27/19 1834 19     Temp 08/27/19 1834 98.9 F (37.2 C)     Temp Source 08/27/19 1834 Oral     SpO2 08/27/19 1834 96 %     Weight 08/27/19 1833 200 lb (90.7 kg)     Height 08/27/19 1833 5\' 8"  (1.727 m)      Head Circumference --      Peak Flow --      Pain Score 08/27/19 1832 2     Pain Loc --      Pain Edu? --      Excl. in [place]? --    No data found.  Updated Vital Signs BP 116/88 (BP Location: Right Arm)   Pulse (!) 106   Temp 98.9 F (37.2 C) (Oral)   Resp 19   Ht 5\' 8"  (1.727 m)   Wt 90.7 kg   SpO2 96%   BMI 30.41 kg/m   Visual Acuity Right Eye Distance:   Left Eye Distance:   Bilateral Distance:    Right Eye Near:   Left Eye Near:    Bilateral Near:     Physical Exam Vitals and nursing note reviewed.  Constitutional:      General: He is not in acute distress.    Appearance: He is not toxic-appearing or diaphoretic.  Cardiovascular:     Heart sounds: Normal heart sounds.  Pulmonary:     Effort: Pulmonary effort is normal. No respiratory distress.     Breath sounds: Wheezing (mild, diffuse, bilaterally) present.  Neurological:     Mental Status: He is alert.      UC Treatments / Results  Labs (all labs ordered are listed, but only abnormal results are displayed) Labs Reviewed  SARS CORONAVIRUS 2 AG (30 MIN TAT)  SARS CORONAVIRUS 2 (TAT 6-24 HRS)  INFLUENZA PANEL BY PCR (TYPE A & B)    EKG   Radiology DG Chest 2 View  Result Date: 08/27/2019 CLINICAL DATA:  59 year old male with cough and shortness of breath. History of lung cancer. EXAM: CHEST - 2 VIEW COMPARISON:  Chest radiograph dated 01/29/2018 and chest CT dated 03/04/2019. FINDINGS: Post radiation changes of the right upper lobe as seen previously. No new consolidation. There is no pleural effusion or pneumothorax. The cardiac silhouette is within normal limits. No acute osseous pathology. IMPRESSION: No acute cardiopulmonary process. Electronically Signed   By: Anner Crete M.D.   On: 08/27/2019 19:10    Procedures Procedures (including critical care time)  Medications Ordered in UC Medications  predniSONE (DELTASONE) tablet 60 mg (60 mg Oral Given 08/27/19 1955)    Initial  Impression / Assessment and Plan / UC Course  I have reviewed the triage vital signs and the nursing notes.  Pertinent labs & imaging results that were available during my care of the patient were reviewed by me  and considered in my medical decision making (see chart for details).      Final Clinical Impressions(s) / UC Diagnoses   Final diagnoses:  COPD exacerbation [facility])    ED Prescriptions    Medication Sig Dispense Auth. Provider   predniSONE (DELTASONE) 20 MG tablet 3 tabs po once day 1, then 2 tabs po qd x 3 days, then 1 tab po qd x 3 days, then half a tab po qd x 2 days 13 tablet Norval Gable, MD     1. Labs/x-ray results and diagnosis reviewed with patient 2. rx as per orders above; reviewed possible side effects, interactions, risks and benefits  3. Recommend continue inhalers  4. Follow-up prn if symptoms worsen or don't improve  PDMP not reviewed this encounter.   Norval Gable, MD 08/28/19 1350

## 2019-09-08 ENCOUNTER — Other Ambulatory Visit: Payer: Self-pay

## 2019-09-08 ENCOUNTER — Ambulatory Visit
Admission: RE | Admit: 2019-09-08 | Discharge: 2019-09-08 | Disposition: A | Discharge status: Home / Self Care | Payer: 59 | Source: Ambulatory Visit | Attending: Oncology | Admitting: Oncology

## 2019-09-08 DIAGNOSIS — C3492 Malignant neoplasm of unspecified part of left bronchus or lung: Secondary | ICD-10-CM | POA: Diagnosis present

## 2019-09-08 LAB — POCT I-STAT CREATININE: Creatinine, Ser: 0.9 mg/dL (ref 0.61–1.24)

## 2019-09-08 MED ORDER — IOHEXOL 300 MG/ML  SOLN
75.0000 mL | Freq: Once | INTRAMUSCULAR | Status: AC | PRN
  Administered 2019-09-08: 75 mL via INTRAVENOUS

## 2019-09-11 NOTE — Progress Notes (Signed)
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  INTERVAL HISTORY: Patient returns to clinic today for further evaluation and discussion of his imaging results.  He was recently in the emergency room with increased cough and shortness of breath, but this is resolved and he feels back to his baseline.  He continues to remain active and work full-time. He has no neurologic complaints. He has a good appetite and denies weight loss.  He denies any chest pain, cough, hemoptysis, or shortness of breath.  He has no nausea, vomiting, constipation, or diarrhea.  He has no urinary complaints.  Patient offers no specific complaints today.  REVIEW OF SYSTEMS:   Review of Systems  Constitutional: Negative.  Negative for fever, malaise/fatigue and weight loss.  HENT: Negative.  Negative for congestion.   Respiratory: Negative.  Negative for cough, hemoptysis and shortness of breath.   Cardiovascular: Negative.  Negative for chest pain and leg swelling.  Gastrointestinal: Negative.  Negative for abdominal pain and heartburn.  Genitourinary: Negative.  Negative for dysuria.  Musculoskeletal: Negative.  Negative for joint pain.  Skin: Negative.  Negative for rash.  Neurological: Negative.  Negative for dizziness, sensory change, focal weakness, weakness and headaches.  Psychiatric/Behavioral: Negative.  The patient is not nervous/anxious.     As per HPI. Otherwise, a complete review of systems is negative.  PAST MEDICAL HISTORY: Past Medical History:  Diagnosis Date  . Asthma   . COPD (chronic  obstructive pulmonary disease) ([place])   . Diabetes mellitus without complication ([place])   . Hypertension   . Lung cancer ([place]) 05/2017   Hx Chemo + rad tx's.  . Pneumonia     PAST SURGICAL HISTORY: Past Surgical History:  Procedure Laterality Date  . ENDOBRONCHIAL ULTRASOUND N/A 05/12/2017   Procedure: ENDOBRONCHIAL ULTRASOUND;  Surgeon: Laverle Hobby, MD;  Location: [facility] ORS;  Service: Pulmonary;  Laterality: N/A;  . FRACTURE SURGERY Left    ANKLE X 2  . TONSILLECTOMY      FAMILY HISTORY: Family History  Problem Relation Age of Onset  . Stroke Mother   . Diabetes Mother   . Hypertension Mother   . Diabetes Father   . Hypertension Father   . Diabetes Sister   . Diabetes Brother   . Heart attack Paternal Uncle   . Stroke Maternal Grandmother     ADVANCED DIRECTIVES (Y/N):  N  HEALTH MAINTENANCE: Social History   Tobacco Use  . Smoking status: Current Some Day Smoker    Packs/day: 0.50  . Smokeless tobacco: Never Used  Substance Use Topics  . Alcohol use: No  . Drug use: No     Colonoscopy:  PAP:  Bone density:  Lipid panel:  Allergies  Allergen Reactions  . Shrimp [Shellfish Allergy] Nausea And Vomiting    Current Outpatient Medications  Medication Sig Dispense Refill  . albuterol (PROVENTIL) (2.5 MG/3ML) 0.083% nebulizer solution Take 2.5 mg every 6 (six) hours as needed by nebulization for wheezing or shortness of breath.    Marland Kitchen albuterol (VENTOLIN HFA) 108 (90 Base) MCG/ACT inhaler Inhale 2 puffs into the lungs every 6 (six) hours as needed for wheezing  or shortness of breath. 1 Inhaler 0  . amLODipine (NORVASC) 5 MG tablet Take 5 mg by mouth daily.    Jearl Klinefelter ELLIPTA 62.5-25 MCG/INH AEPB INHALE 1 PUFF INTO THE LUNGS DAILY 60 each 2  . atorvastatin (LIPITOR) 20 MG tablet Take 20 mg by mouth daily.    . beclomethasone (QVAR) 80 MCG/ACT inhaler Inhale 1 puff into the lungs 2 (two) times daily.    Marland Kitchen glipiZIDE (GLUCOTROL) 5 MG tablet TK 1 T PO QD      . ibuprofen (ADVIL,MOTRIN) 200 MG tablet Take 400-800 mg every 8 (eight) hours as needed by mouth (for pain.).    Marland Kitchen lisinopril-hydrochlorothiazide (ZESTORETIC) 20-25 MG tablet Take 1 tablet by mouth daily. 30 tablet 0  . metFORMIN (GLUCOPHAGE) 1000 MG tablet Take 1 tablet (1,000 mg total) by mouth 2 (two) times daily. 60 tablet 0  . omeprazole (PRILOSEC) 20 MG capsule TAKE 1 CAPSULE(20 MG) BY MOUTH DAILY 30 capsule 2  . predniSONE (DELTASONE) 20 MG tablet 3 tabs po once day 1, then 2 tabs po qd x 3 days, then 1 tab po qd x 3 days, then half a tab po qd x 2 days 13 tablet 0   No current facility-administered medications for this visit.    OBJECTIVE: Vitals:   09/16/19 1426  BP: (!) 142/92  Pulse: 98  Resp: (!) 24  Temp: (!) 96.9 F (36.1 C)     Body mass index is 31.32 kg/m.    ECOG FS:0 - Asymptomatic  General: Well-developed, well-nourished, no acute distress. Eyes: Pink conjunctiva, anicteric sclera. HEENT: Normocephalic, moist mucous membranes. Lungs: No audible wheezing or coughing. Heart: Regular rate and rhythm. Abdomen: Soft, nontender, no obvious distention. Musculoskeletal: No edema, cyanosis, or clubbing. Neuro: Alert, answering all questions appropriately. Cranial nerves grossly intact. Skin: No rashes or petechiae noted. Psych: Normal affect. Lymphatics: No palpable lymphadenopathy.  LAB RESULTS:  Lab Results  Component Value Date   NA 134 (L) 12/01/2018   K 4.1 12/01/2018   CL 96 (L) 12/01/2018   CO2 26 12/01/2018   GLUCOSE 251 (H) 12/01/2018   BUN 11 12/01/2018   CREATININE 0.90 09/08/2019   CALCIUM 8.9 12/01/2018   PROT 7.0 12/01/2018   ALBUMIN 3.9 12/01/2018   AST 17 12/01/2018   ALT 22 12/01/2018   ALKPHOS 95 12/01/2018   BILITOT 0.7 12/01/2018   GFRNONAA >60 12/01/2018   GFRAA >60 12/01/2018    Lab Results  Component Value Date   WBC 11.2 (H) 12/01/2018   NEUTROABS 8.5 (H) 12/01/2018   HGB 15.6 12/01/2018   HCT 46.9 12/01/2018   MCV 86.7  12/01/2018   PLT 271 12/01/2018     STUDIES: DG Chest 2 View  Result Date: 08/27/2019 CLINICAL DATA:  59 year old male with cough and shortness of breath. History of lung cancer. EXAM: CHEST - 2 VIEW COMPARISON:  Chest radiograph dated 01/29/2018 and chest CT dated 03/04/2019. FINDINGS: Post radiation changes of the right upper lobe as seen previously. No new consolidation. There is no pleural effusion or pneumothorax. The cardiac silhouette is within normal limits. No acute osseous pathology. IMPRESSION: No acute cardiopulmonary process. Electronically Signed   By: Anner Crete M.D.   On: 08/27/2019 19:10   CT CHEST W CONTRAST  Result Date: 09/08/2019 CLINICAL DATA:  Follow-up lung cancer. Shortness of breath, wheezing. EXAM: CT CHEST WITH CONTRAST TECHNIQUE: Multidetector CT imaging of the chest was performed during intravenous contrast administration. CONTRAST:  49m OMNIPAQUE IOHEXOL 300 MG/ML  SOLN COMPARISON:  03/04/2019. FINDINGS: Cardiovascular: Heart is normal in size.  No pericardial effusion. No evidence of thoracic aortic aneurysm. Atherosclerotic calcifications of the aortic arch. Three vessel coronary atherosclerosis. Mediastinum/Nodes: No suspicious mediastinal lymphadenopathy. Mild residual soft tissue thickening in the right perihilar region (series 3/image 33), without discrete mass. No suspicious axillary lymphadenopathy. 15 mm short axis right supraclavicular node (series 2/image 13), previously 13 mm. Additional 2.0 cm short axis node at the right thoracic inlet (series 2/image 23), previously 14 mm. Lungs/Pleura: Radiation changes in the central right upper lobe/perihilar region. Associated mild volume loss. Mild centrilobular and paraseptal emphysematous changes, upper lung predominant. No new/suspicious pulmonary nodules. Mild scarring in the lingula. No focal consolidation. No pleural effusion or pneumothorax. Upper Abdomen: Visualized upper abdomen is notable for stable mild  nodular thickening of the right adrenal gland and a 1.9 cm stable left adrenal nodule which was previously characterized as a benign adrenal adenoma. Musculoskeletal: Visualized osseous structures are within normal limits. IMPRESSION: Progressive lymphadenopathy in the right supraclavicular region and right thoracic inlet, compatible with nodal metastases/recurrence. Radiation changes in the right hemithorax, as above. Aortic Atherosclerosis (ICD10-I70.0) and Emphysema (ICD10-J43.9). Electronically Signed   By: Julian Hy M.D.   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  PLAN:    1. 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  He subsequently completed 1 year of maintenance durvalumab on August 27, 2018.  His most recent CT scan on September 08, 2019 reviewed independently and reported as above with progressive lymphadenopathy in his right supraclavicular and right thoracic inlet.  These are concerning for recurrence.  Will order PET scan to confirm.  If PET scan is positive, will likely recommend biopsy.  In the meantime, will order PD-L1 on his pathology specimen from 2018.  Return to clinic 1 day after the PET scan to discuss the results and additional diagnostic planning if necessary.  2.  Hypertension: Chronic and unchanged.  Continue evaluation and treatment per primary care.  I spent a total of 20 minutes reviewing chart data, face-to-face evaluation with the patient, counseling and coordination of care as detailed above.   Patient expressed understanding and was in agreement with this plan. He also understands that He can call clinic at any time with any questions, concerns, or complaints.   Cancer Staging Squamous cell lung cancer, left [facility]) Staging form: Lung, AJCC 8th Edition - Clinical stage from 05/16/2017: Stage IIIB (cT3, cN2, cM0)  - Signed by Lloyd Huger, MD on 05/16/2017   Lloyd Huger, MD   09/16/2019 3:39 PM

## 2019-09-16 ENCOUNTER — Encounter: Payer: Self-pay | Admitting: Oncology

## 2019-09-16 ENCOUNTER — Other Ambulatory Visit: Payer: Self-pay | Admitting: *Deleted

## 2019-09-16 ENCOUNTER — Encounter: Payer: Self-pay | Admitting: Radiation Oncology

## 2019-09-16 ENCOUNTER — Other Ambulatory Visit: Payer: Self-pay

## 2019-09-16 ENCOUNTER — Inpatient Hospital Stay: Payer: 59 | Attending: Oncology | Admitting: Oncology

## 2019-09-16 ENCOUNTER — Ambulatory Visit
Admission: RE | Admit: 2019-09-16 | Discharge: 2019-09-16 | Disposition: A | Discharge status: Home / Self Care | Payer: 59 | Source: Ambulatory Visit | Attending: Radiation Oncology | Admitting: Radiation Oncology

## 2019-09-16 VITALS — BP 142/92 | HR 98 | Temp 96.9°F | Resp 24 | Wt 206.0 lb

## 2019-09-16 DIAGNOSIS — I10 Essential (primary) hypertension: Secondary | ICD-10-CM | POA: Insufficient documentation

## 2019-09-16 DIAGNOSIS — N281 Cyst of kidney, acquired: Secondary | ICD-10-CM | POA: Diagnosis not present

## 2019-09-16 DIAGNOSIS — C3412 Malignant neoplasm of upper lobe, left bronchus or lung: Secondary | ICD-10-CM | POA: Insufficient documentation

## 2019-09-16 DIAGNOSIS — C3492 Malignant neoplasm of unspecified part of left bronchus or lung: Secondary | ICD-10-CM

## 2019-09-16 DIAGNOSIS — Z79899 Other long term (current) drug therapy: Secondary | ICD-10-CM | POA: Diagnosis not present

## 2019-09-16 DIAGNOSIS — R599 Enlarged lymph nodes, unspecified: Secondary | ICD-10-CM | POA: Diagnosis not present

## 2019-09-16 DIAGNOSIS — J449 Chronic obstructive pulmonary disease, unspecified: Secondary | ICD-10-CM | POA: Diagnosis not present

## 2019-09-16 DIAGNOSIS — Z7984 Long term (current) use of oral hypoglycemic drugs: Secondary | ICD-10-CM | POA: Insufficient documentation

## 2019-09-16 DIAGNOSIS — F17210 Nicotine dependence, cigarettes, uncomplicated: Secondary | ICD-10-CM | POA: Diagnosis not present

## 2019-09-16 DIAGNOSIS — K5730 Diverticulosis of large intestine without perforation or abscess without bleeding: Secondary | ICD-10-CM | POA: Diagnosis not present

## 2019-09-16 DIAGNOSIS — E119 Type 2 diabetes mellitus without complications: Secondary | ICD-10-CM | POA: Insufficient documentation

## 2019-09-16 DIAGNOSIS — M25511 Pain in right shoulder: Secondary | ICD-10-CM | POA: Diagnosis not present

## 2019-09-16 DIAGNOSIS — Z923 Personal history of irradiation: Secondary | ICD-10-CM | POA: Diagnosis not present

## 2019-09-16 DIAGNOSIS — Z7952 Long term (current) use of systemic steroids: Secondary | ICD-10-CM | POA: Diagnosis not present

## 2019-09-16 DIAGNOSIS — D3502 Benign neoplasm of left adrenal gland: Secondary | ICD-10-CM | POA: Diagnosis not present

## 2019-09-16 DIAGNOSIS — Z9221 Personal history of antineoplastic chemotherapy: Secondary | ICD-10-CM | POA: Diagnosis not present

## 2019-09-16 DIAGNOSIS — Z85118 Personal history of other malignant neoplasm of bronchus and lung: Secondary | ICD-10-CM | POA: Insufficient documentation

## 2019-09-16 NOTE — Progress Notes (Signed)
Radiation Oncology Follow up Note  Name: Gabriel Mendez   Date:   09/16/2019 MRN:  [id number] DOB: 1961-01-21    This 59 y.o. 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  He is seen today in routine follow-up had a recent urgent care visit for increasing cough chest tightness which did respond to short course of steroids.  He has been having some right shoulder pain.Marland Kitchen  He was noted back in September 2020 to have some enlarged right supraclavicular and high right paratracheal lymph nodes.  This seems to have grown somewhat on recent CT scan this past month.  He now has progressive lymphadenopathy right supraclavicular region right thoracic inlet compatible with nodal recurrence.  Changes in the right hemithorax are stable.  COMPLICATIONS OF TREATMENT: none  FOLLOW UP COMPLIANCE: keeps appointments   PHYSICAL EXAM:  BP (!) 142/92   Pulse 98   Temp (!) 96.9 F (36.1 C) (Tympanic)   Resp (!) 24   Wt 206 lb (93.4 kg)   BMI 31.32 kg/m  Well-developed well-nourished patient in NAD. HEENT reveals PERLA, EOMI, discs not visualized.  Oral cavity is clear. No oral mucosal lesions are identified. Neck is clear without evidence of cervical or supraclavicular adenopathy. Lungs are clear to A&P. Cardiac examination is essentially unremarkable with regular rate and rhythm without murmur rub or thrill. Abdomen is benign with no organomegaly or masses noted. Motor sensory and DTR levels are equal and symmetric in the upper and lower extremities. Cranial nerves II through XII are grossly intact. Proprioception is intact. No peripheral adenopathy or edema is identified. No motor or sensory  levels are noted. Crude visual fields are within normal range.  RADIOLOGY RESULTS: CT scans reviewed compatible with above-stated findings  PLAN: Present time of ordered a PET CT scan he is seeing Dr. Grayland Ormond this afternoon also.  Depending on PET/CT findings might try a biopsy of his right supraclavicular nodes.  Once we have PET results will discuss case with medical oncology for possible further chemotherapy immunotherapy if this is indeed progressive recurrent disease with possible radiation therapy to that region for palliation of his shoulder pain.  I have reviewed this with the patient.  PET scan was ordered I will see him 2 days after PET scan for discussion.  He also continues close follow-up care and management by medical oncology.  I would like to take this opportunity to thank you for allowing me to participate in the care of your patient.[name], MD

## 2019-09-24 ENCOUNTER — Encounter: Payer: Self-pay | Admitting: Oncology

## 2019-09-24 NOTE — Progress Notes (Signed)
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  INTERVAL HISTORY: Patient returns to clinic today for further evaluation, discussion of his PET scan results, and additional diagnostic planning.  He continues to have a residual cough, but otherwise feels well.  He continues to remain active and work full-time. He has no neurologic complaints. He has a good appetite and denies weight loss.  He denies any chest pain, cough, hemoptysis, or shortness of breath.  He has no nausea, vomiting, constipation, or diarrhea.  He has no urinary complaints.  Patient offers no further specific complaints today.  REVIEW OF SYSTEMS:   Review of Systems  Constitutional: Negative.  Negative for fever, malaise/fatigue and weight loss.  HENT: Negative.  Negative for congestion.   Respiratory: Positive for cough. Negative for hemoptysis and shortness of breath.   Cardiovascular: Negative.  Negative for chest pain and leg swelling.  Gastrointestinal: Negative.  Negative for abdominal pain and heartburn.  Genitourinary: Negative.  Negative for dysuria.  Musculoskeletal: Negative.  Negative for joint pain.  Skin: Negative.  Negative for rash.  Neurological: Negative.  Negative for dizziness, sensory change, focal weakness, weakness and headaches.  Psychiatric/Behavioral: Negative.  The patient is not nervous/anxious.     As per HPI. Otherwise, a complete review of systems is negative.  PAST MEDICAL HISTORY: Past Medical History:  Diagnosis Date   Asthma    COPD (chronic obstructive pulmonary disease) ([place])      Diabetes mellitus without complication ([place])    Hypertension    Lung cancer ([place]) 05/2017   Hx Chemo + rad tx's.   Pneumonia     PAST SURGICAL HISTORY: Past Surgical History:  Procedure Laterality Date   ENDOBRONCHIAL ULTRASOUND N/A 05/12/2017   Procedure: ENDOBRONCHIAL ULTRASOUND;  Surgeon: Laverle Hobby, MD;  Location: [facility] ORS;  Service: Pulmonary;  Laterality: N/A;   FRACTURE SURGERY Left    ANKLE X 2   TONSILLECTOMY      FAMILY HISTORY: Family History  Problem Relation Age of Onset   Stroke Mother    Diabetes Mother    Hypertension Mother    Diabetes Father    Hypertension Father    Diabetes Sister    Diabetes Brother    Heart attack Paternal Uncle    Stroke Maternal Grandmother     ADVANCED DIRECTIVES (Y/N):  N  HEALTH MAINTENANCE: Social History   Tobacco Use   Smoking status: Current Some Day Smoker    Packs/day: 0.50   Smokeless tobacco: Never Used  Substance Use Topics   Alcohol use: No   Drug use: No     Colonoscopy:  PAP:  Bone density:  Lipid panel:  Allergies  Allergen Reactions   Shrimp [Shellfish Allergy] Nausea And Vomiting    Current Outpatient Medications  Medication Sig Dispense Refill   albuterol (PROVENTIL) (2.5 MG/3ML) 0.083% nebulizer solution Take 2.5 mg every 6 (six) hours as needed by nebulization for wheezing or shortness of breath.     albuterol (VENTOLIN HFA) 108 (90 Base) MCG/ACT inhaler Inhale 2 puffs into the lungs every 6 (six) hours as needed for wheezing or shortness of breath. 1 Inhaler 0  amLODipine (NORVASC) 5 MG tablet Take 5 mg by mouth daily.     ANORO ELLIPTA 62.5-25 MCG/INH AEPB INHALE 1 PUFF INTO THE LUNGS DAILY 60 each 2   atorvastatin (LIPITOR) 20 MG tablet Take 20 mg by mouth daily.     glipiZIDE (GLUCOTROL) 5 MG tablet TK 1 T PO QD     ibuprofen (ADVIL,MOTRIN) 200 MG tablet Take 400-800 mg every 8 (eight) hours as needed by mouth (for pain.).      lisinopril-hydrochlorothiazide (ZESTORETIC) 20-25 MG tablet Take 1 tablet by mouth daily. 30 tablet 0   metFORMIN (GLUCOPHAGE) 1000 MG tablet Take 1 tablet (1,000 mg total) by mouth 2 (two) times daily. 60 tablet 0   omeprazole (PRILOSEC) 20 MG capsule TAKE 1 CAPSULE(20 MG) BY MOUTH DAILY 30 capsule 2   predniSONE (DELTASONE) 20 MG tablet 3 tabs po once day 1, then 2 tabs po qd x 3 days, then 1 tab po qd x 3 days, then half a tab po qd x 2 days 13 tablet 0   SYMBICORT 160-4.5 MCG/ACT inhaler Inhale 2 puffs into the lungs in the morning and at bedtime.     No current facility-administered medications for this visit.    OBJECTIVE: Vitals:   09/28/19 1008  BP: 134/82  Pulse: 86  Resp: 20  Temp: (!) 95.7 F (35.4 C)  SpO2: 98%     Body mass index is 31.31 kg/m.    ECOG FS:0 - Asymptomatic  General: Well-developed, well-nourished, no acute distress. Eyes: Pink conjunctiva, anicteric sclera. HEENT: Normocephalic, moist mucous membranes. Lungs: No audible wheezing or coughing. Heart: Regular rate and rhythm. Abdomen: Soft, nontender, no obvious distention. Musculoskeletal: No edema, cyanosis, or clubbing. Neuro: Alert, answering all questions appropriately. Cranial nerves grossly intact. Skin: No rashes or petechiae noted. Psych: Normal affect.   LAB RESULTS:  Lab Results  Component Value Date   NA 134 (L) 12/01/2018   K 4.1 12/01/2018   CL 96 (L) 12/01/2018   CO2 26 12/01/2018   GLUCOSE 251 (H) 12/01/2018   BUN 11 12/01/2018   CREATININE 0.90 09/08/2019   CALCIUM 8.9 12/01/2018   PROT 7.0 12/01/2018   ALBUMIN 3.9 12/01/2018   AST 17 12/01/2018   ALT 22 12/01/2018   ALKPHOS 95 12/01/2018   BILITOT 0.7 12/01/2018   GFRNONAA >60 12/01/2018   GFRAA >60 12/01/2018    Lab Results  Component Value Date   WBC 11.2 (H) 12/01/2018   NEUTROABS 8.5 (H) 12/01/2018   HGB 15.6 12/01/2018   HCT 46.9 12/01/2018   MCV 86.7 12/01/2018   PLT 271 12/01/2018     STUDIES: CT  CHEST W CONTRAST  Result Date: 09/08/2019 CLINICAL DATA:  Follow-up lung cancer. Shortness of breath, wheezing. EXAM: CT CHEST WITH CONTRAST TECHNIQUE: Multidetector CT imaging of the chest was performed during intravenous contrast administration. CONTRAST:  32m OMNIPAQUE IOHEXOL 300 MG/ML  SOLN COMPARISON:  03/04/2019. FINDINGS: Cardiovascular: Heart is normal in size.  No pericardial effusion. No evidence of thoracic aortic aneurysm. Atherosclerotic calcifications of the aortic arch. Three vessel coronary atherosclerosis. Mediastinum/Nodes: No suspicious mediastinal lymphadenopathy. Mild residual soft tissue thickening in the right perihilar region (series 3/image 33), without discrete mass. No suspicious axillary lymphadenopathy. 15 mm short axis right supraclavicular node (series 2/image 13), previously 13 mm. Additional 2.0 cm short axis node at the right thoracic inlet (series 2/image 23), previously 14 mm. Lungs/Pleura: Radiation changes in the central right upper lobe/perihilar region. Associated mild volume loss. Mild centrilobular and paraseptal  emphysematous changes, upper lung predominant. No new/suspicious pulmonary nodules. Mild scarring in the lingula. No focal consolidation. No pleural effusion or pneumothorax. Upper Abdomen: Visualized upper abdomen is notable for stable mild nodular thickening of the right adrenal gland and a 1.9 cm stable left adrenal nodule which was previously characterized as a benign adrenal adenoma. Musculoskeletal: Visualized osseous structures are within normal limits. IMPRESSION: Progressive lymphadenopathy in the right supraclavicular region and right thoracic inlet, compatible with nodal metastases/recurrence. Radiation changes in the right hemithorax, as above. Aortic Atherosclerosis (ICD10-I70.0) and Emphysema (ICD10-J43.9). Electronically Signed   By: Julian Hy M.D.   On: 09/08/2019 12:54   NM PET Image Restag (PS) Skull Base To Thigh  Result Date:  09/27/2019 CLINICAL DATA:  Subsequent treatment strategy for right lung squamous cell carcinoma with enlarging right thoracic inlet adenopathy on recent chest CT. EXAM: NUCLEAR MEDICINE PET SKULL BASE TO THIGH TECHNIQUE: 11.4 mCi F-18 FDG was injected intravenously. Full-ring PET imaging was performed from the skull base to thigh after the radiotracer. CT data was obtained and used for attenuation correction and anatomic localization. Fasting blood glucose: 209 mg/dl COMPARISON:  09/08/2017 PET-CT.  09/08/2019 chest CT. FINDINGS: Mediastinal blood pool activity: SUV max 2.1 Liver activity: SUV max NA NECK: New hypermetabolic 1.7 cm short axis diameter right supraclavicular node with max SUV 6.9 (series 3/image 66). No additional hypermetabolic lymph nodes in the neck. Hypermetabolic posterior left parotid 1.1 cm soft tissue nodule with max SUV 4.9 (series 3/image 31) not appreciably changed since 09/08/2017 PET-CT. Incidental CT findings: none CHEST: Poorly marginated 2.5 cm short axis diameter hypermetabolic lymph node at the right thoracic inlet with max SUV 6.5 (series 3/image 72), new from prior PET-CT. No additional hypermetabolic lymph nodes in the mediastinum. No hypermetabolic axillary or hilar lymph nodes. No hypermetabolic pulmonary findings. Stable sharply marginated consolidation with associated volume loss and distortion in the parahilar right lung with no evidence of recurrent hypermetabolism in this location. Incidental CT findings: Coronary atherosclerosis. Atherosclerotic nonaneurysmal thoracic aorta. Mild centrilobular and paraseptal emphysema. Patchy tree-in-bud type opacities in the right middle lobe and anterior peripheral right lower lobe and lingula, new. ABDOMEN/PELVIS: No abnormal hypermetabolic activity within the liver, pancreas, adrenal glands, or spleen. No hypermetabolic lymph nodes in the abdomen or pelvis. Nonspecific focal hypermetabolism at the anorectal junction without CT  correlate, unchanged from prior, more likely physiologic. Incidental CT findings: Non hypermetabolic 1.4 cm left adrenal nodule with density -6 HU, compatible with a benign adenoma. Simple 3.1 cm lateral lower left renal cyst. Atherosclerotic nonaneurysmal thoracic aorta. Mild right colonic diverticulosis. Mild prostatomegaly. SKELETON: No focal hypermetabolic activity to suggest skeletal metastasis. Incidental CT findings: none IMPRESSION: 1. Hypermetabolic right supraclavicular and right thoracic inlet lymphadenopathy, compatible with recurrent metastatic disease. No additional sites of hypermetabolic metastatic disease. 2. Stable post treatment changes in the perihilar right lung with no hypermetabolic locally recurrent disease. 3. 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 4. Nonspecific patchy tree-in-bud opacities in the mid lungs bilaterally, new since recent 09/08/2019 chest CT study, most compatible with a nonspecific mild bronchiolitis. 5. Stable left adrenal adenoma. 6. Aortic Atherosclerosis (ICD10-I70.0) and Emphysema (ICD10-J43.9). Electronically Signed   By: Ilona Sorrel M.D.   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  PLAN:    1. Stage IIIb squamous cell carcinoma of the left  lung: Patient initially underwent treatment with weekly carboplatinum and Taxol along with daily XRT.  He subsequently completed 1 year of maintenance durvalumab on August 27, 2018.  His most recent CT scan on September 08, 2019 reviewed independently and reported as above with progressive lymphadenopathy in his right supraclavicular and right thoracic inlet.  PET scan completed on September 27, 2019 reviewed independently report as above confirming likely recurrence.  Patient will require repeat biopsy not only to confirm the diagnosis, but to obtain  additional tissue to assess for molecular studies as well as PD-L1.  Pathology specimen 2018 had insufficient tissue for additional testing.  Return to clinic 1 week after biopsy to discuss the results and treatment planning.  2.  Hypertension: Chronic and unchanged.  Continue evaluation and treatment per primary care.  I spent a total of 30 minutes reviewing chart data, face-to-face evaluation with the patient, counseling and coordination of care as detailed above.   Patient expressed understanding and was in agreement with this plan. He also understands that He can call clinic at any time with any questions, concerns, or complaints.   Cancer Staging Squamous cell lung cancer, left [facility]) Staging form: Lung, AJCC 8th Edition - Clinical stage from 05/16/2017: Stage IIIB (cT3, cN2, cM0) - Signed by Lloyd Huger, MD on 05/16/2017   Lloyd Huger, MD   09/28/2019 2:50 PM

## 2019-09-27 ENCOUNTER — Encounter: Payer: Self-pay | Admitting: Oncology

## 2019-09-27 ENCOUNTER — Other Ambulatory Visit: Payer: Self-pay

## 2019-09-27 ENCOUNTER — Encounter
Admission: RE | Admit: 2019-09-27 | Discharge: 2019-09-27 | Disposition: A | Discharge status: Home / Self Care | Payer: 59 | Source: Ambulatory Visit | Attending: Radiation Oncology | Admitting: Radiation Oncology

## 2019-09-27 DIAGNOSIS — C3492 Malignant neoplasm of unspecified part of left bronchus or lung: Secondary | ICD-10-CM

## 2019-09-27 LAB — GLUCOSE, CAPILLARY: Glucose-Capillary: 209 mg/dL — ABNORMAL HIGH (ref 70–99)

## 2019-09-27 MED ORDER — FLUDEOXYGLUCOSE F - 18 (FDG) INJECTION
10.7000 | Freq: Once | INTRAVENOUS | Status: AC | PRN
  Administered 2019-09-27: 11.4 via INTRAVENOUS

## 2019-09-28 ENCOUNTER — Ambulatory Visit
Admission: RE | Admit: 2019-09-28 | Discharge: 2019-09-28 | Disposition: A | Discharge status: Home / Self Care | Payer: 59 | Source: Ambulatory Visit | Attending: Radiation Oncology | Admitting: Radiation Oncology

## 2019-09-28 ENCOUNTER — Inpatient Hospital Stay (HOSPITAL_BASED_OUTPATIENT_CLINIC_OR_DEPARTMENT_OTHER): Payer: 59 | Admitting: Oncology

## 2019-09-28 ENCOUNTER — Encounter: Payer: Self-pay | Admitting: Oncology

## 2019-09-28 VITALS — BP 134/82 | HR 86 | Temp 95.7°F | Resp 20 | Wt 205.9 lb

## 2019-09-28 DIAGNOSIS — C3492 Malignant neoplasm of unspecified part of left bronchus or lung: Secondary | ICD-10-CM

## 2019-09-28 DIAGNOSIS — F17210 Nicotine dependence, cigarettes, uncomplicated: Secondary | ICD-10-CM | POA: Diagnosis not present

## 2019-09-28 DIAGNOSIS — Z85118 Personal history of other malignant neoplasm of bronchus and lung: Secondary | ICD-10-CM | POA: Insufficient documentation

## 2019-09-28 DIAGNOSIS — R599 Enlarged lymph nodes, unspecified: Secondary | ICD-10-CM | POA: Insufficient documentation

## 2019-09-28 DIAGNOSIS — C3412 Malignant neoplasm of upper lobe, left bronchus or lung: Secondary | ICD-10-CM | POA: Diagnosis not present

## 2019-09-28 DIAGNOSIS — R918 Other nonspecific abnormal finding of lung field: Secondary | ICD-10-CM | POA: Diagnosis not present

## 2019-09-28 DIAGNOSIS — Z9221 Personal history of antineoplastic chemotherapy: Secondary | ICD-10-CM | POA: Insufficient documentation

## 2019-09-28 DIAGNOSIS — Z923 Personal history of irradiation: Secondary | ICD-10-CM | POA: Insufficient documentation

## 2019-09-28 NOTE — Progress Notes (Signed)
Radiation Oncology Follow up Note  Name: Gabriel Mendez   Date:   09/28/2019 MRN:  [id number] DOB: 01-19-1961    This 59 y.o. male presents to the clinic today for follow-up of PET scan results and patient 3 years out from concurrent chemoradiation therapy for stage IIIb squamous cell carcinoma the right lung.  REFERRING PROVIDER: Lynnell Jude, MD  HPI: Patient is a 59 year old male now out 3 years having completed concurrent chemoradiation therapy for stage IIIb squamous cell carcinoma the right lung.  I saw him several weeks ago when CT scan had showed some enlarging right supraclavicular and high right paratracheal lymph nodes.  He was having some right shoulder pain and tightness.  I ordered a PET CT scan.  Which demonstrated hypermetabolic activity in the right supraclavicular right thoracic inlet lymph nodes.  This is compatible with recurrent disease he has been seen by medical oncology and biopsy is being arranged.  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  He specifically denies cough hemoptysis or chest tightness.  COMPLICATIONS OF TREATMENT: none  FOLLOW UP COMPLIANCE: keeps appointments   PHYSICAL EXAM:  There were no vitals taken for this visit. Well-developed well-nourished patient in NAD. HEENT reveals PERLA, EOMI, discs not visualized.  Oral cavity is clear. No oral mucosal lesions are identified. Neck is clear without evidence of cervical or supraclavicular adenopathy. Lungs are clear to A&P. Cardiac examination is essentially unremarkable with regular rate and rhythm without murmur rub or thrill. Abdomen is benign with no organomegaly or masses noted. Motor sensory and DTR levels are equal and symmetric in the upper and lower extremities. Cranial nerves II through XII are grossly intact. Proprioception is intact. No peripheral adenopathy or edema is identified. No motor or  sensory levels are noted. Crude visual fields are within normal range.  RADIOLOGY RESULTS: PET scan reviewed compatible with above-stated findings.  PLAN: At this time I will await biopsy results and I have asked to see him back right after his biopsy for opinion.  Should this be positive for metastatic lung cancer may offer SBRT treatments in 5 fractions to these areas of hypermetabolic [place] he with curative intent.  We will discussed the case in tumor conference with Dr. Grayland Ormond.  I have set up a follow appointment shortly after his biopsy.  Patient knows to call with any concerns.  I would like to take this opportunity to thank you for allowing me to participate in the care of your patient.[name], MD

## 2019-09-30 ENCOUNTER — Other Ambulatory Visit: Payer: 59

## 2019-09-30 ENCOUNTER — Other Ambulatory Visit: Payer: Self-pay | Admitting: Radiology

## 2019-09-30 NOTE — Progress Notes (Signed)
Tumor Board Documentation  Gabriel Mendez was presented by Dr Grayland Ormond at our Tumor Board on 09/30/2019, which included representatives from medical oncology, radiation oncology, navigation, pathology, radiology, surgical, surgical oncology, internal medicine, genetics, palliative care, research.  Gabriel Mendez currently presents as a current patient, for discussion with history of the following treatments: neoadjuvant chemoradiation, immunotherapy, active survellience.  Additionally, we reviewed previous medical and familial history, history of present illness, and recent lab results along with all available histopathologic and imaging studies. The tumor board considered available treatment options and made the following recommendations: Biopsy Possible Radiation Therapy  The following procedures/referrals were also placed: No orders of the defined types were placed in this encounter.   Clinical Trial Status: not discussed   Staging used: Pathologic Stage  AJCC Staging:       Group: Stage 3 B Squamous Cell Lung Cancer   National site-specific guidelines NCCN were discussed with respect to the case.  Tumor board is a meeting of clinicians from various specialty areas who evaluate and discuss patients for whom a multidisciplinary approach is being considered. Final determinations in the plan of care are those of the provider(s). The responsibility for follow up of recommendations given during tumor board is that of the provider.   Today's extended care, comprehensive team conference, Gabriel Mendez was not present for the discussion and was not examined.   Multidisciplinary Tumor Board is a multidisciplinary case peer review process.  Decisions discussed in the Multidisciplinary Tumor Board reflect the opinions of the specialists present at the conference without having examined the patient.  Ultimately, treatment and diagnostic decisions rest with the primary provider(s) and the patient.

## 2019-10-04 ENCOUNTER — Ambulatory Visit
Admission: RE | Admit: 2019-10-04 | Discharge: 2019-10-04 | Disposition: A | Discharge status: Home / Self Care | Payer: 59 | Source: Ambulatory Visit | Attending: Oncology | Admitting: Oncology

## 2019-10-04 ENCOUNTER — Other Ambulatory Visit: Payer: Self-pay

## 2019-10-04 DIAGNOSIS — C3492 Malignant neoplasm of unspecified part of left bronchus or lung: Secondary | ICD-10-CM | POA: Diagnosis not present

## 2019-10-04 LAB — GLUCOSE, CAPILLARY: Glucose-Capillary: 296 mg/dL — ABNORMAL HIGH (ref 70–99)

## 2019-10-04 MED ORDER — SODIUM CHLORIDE 0.9 % IV SOLN: INTRAVENOUS | Status: DC

## 2019-10-04 NOTE — Procedures (Signed)
Interventional Radiology Procedure Note  Procedure: [place] BX RT SUPRACLAVICULAR NODE  Complications: None  Estimated Blood Loss: MIN  Findings: 18 G CORES X 4

## 2019-10-04 NOTE — Progress Notes (Signed)
Patient clinically stable post LN biopsy, tolerated well with local anesthetic, denies complaints at this time. Ice pack provided. Discharge instructions given with questions answered. bandade dressing dry and intact.

## 2019-10-04 NOTE — Progress Notes (Signed)
Patient without complaints, ready for discharge per orders, ice pack with patient. Vitals stable.

## 2019-10-04 NOTE — Discharge Instructions (Signed)

## 2019-10-04 NOTE — Progress Notes (Signed)
Chief Complaint: Patient was seen in consultation today for squamous cell lung cancer  Referring Physician(s): Finnegan,Timothy J  Supervising Physician: Daryll Brod  Patient Status: [facility] - Out-pt  History of Present Illness: Gabriel Mendez is a 59 y.o. male with past medical history of asthma, COPD, DM, HTN, and remote stage 3B squamous cell lung cancer s/p chemoradiation 3 years ago recently found to have enlarging right supraclavicular and right paratracheal lymph nodes on recent CT imaging.  Patient discussed in cancer conference 09/30/19 at which time it was recommended the patient undergo biopsy.  Case reviewed by Dr. Anselm Pancoast who approves patient for right supraclavicular lymph node biopsy.  Patient presents to [facility] today for procedure.   [name] presents in his usual state of health today.  He denies new complaints or concerns. He wishes to proceed without sedation today.   Past Medical History:  Diagnosis Date  . Asthma   . COPD (chronic obstructive pulmonary disease) ([place])   . Diabetes mellitus without complication ([place])   . Hypertension   . Lung cancer ([place]) 05/2017   Hx Chemo + rad tx's.  . Pneumonia     Past Surgical History:  Procedure Laterality Date  . ENDOBRONCHIAL ULTRASOUND N/A 05/12/2017   Procedure: ENDOBRONCHIAL ULTRASOUND;  Surgeon: Laverle Hobby, MD;  Location: [facility] ORS;  Service: Pulmonary;  Laterality: N/A;  . FRACTURE SURGERY Left    ANKLE X 2  . TONSILLECTOMY      Allergies: Shrimp [shellfish allergy]  Medications: Prior to Admission medications   Medication Sig Start Date End Date Taking? Authorizing Provider  albuterol (PROVENTIL) (2.5 MG/3ML) 0.083% nebulizer solution Take 2.5 mg every 6 (six) hours as needed by nebulization for wheezing or shortness of breath.    [provider]  albuterol (VENTOLIN HFA) 108 (90 Base) MCG/ACT inhaler Inhale 2 puffs into the lungs every 6 (six) hours as needed for wheezing or  shortness of breath. 11/21/18   Karen Kitchens, NP  amLODipine (NORVASC) 5 MG tablet Take 5 mg by mouth daily. 06/26/19   [provider]  Gabriel Mendez 62.5-25 MCG/INH AEPB INHALE 1 PUFF INTO THE LUNGS DAILY 10/12/18   Laverle Hobby, MD  atorvastatin (LIPITOR) 20 MG tablet Take 20 mg by mouth daily. 02/09/19   [provider]  glipiZIDE (GLUCOTROL) 5 MG tablet TK 1 T PO QD 03/22/19   [provider]  ibuprofen (ADVIL,MOTRIN) 200 MG tablet Take 400-800 mg every 8 (eight) hours as needed by mouth (for pain.).    [provider]  lisinopril-hydrochlorothiazide (ZESTORETIC) 20-25 MG tablet Take 1 tablet by mouth daily. 11/21/18   Karen Kitchens, NP  metFORMIN (GLUCOPHAGE) 1000 MG tablet Take 1 tablet (1,000 mg total) by mouth 2 (two) times daily. 11/21/18   Karen Kitchens, NP  omeprazole (PRILOSEC) 20 MG capsule TAKE 1 CAPSULE(20 MG) BY MOUTH DAILY 10/12/18   Gabriel Huger, MD  predniSONE (DELTASONE) 20 MG tablet 3 tabs po once day 1, then 2 tabs po qd x 3 days, then 1 tab po qd x 3 days, then half a tab po qd x 2 days 08/27/19   Norval Gable, MD  [facility] 160-4.5 MCG/ACT inhaler Inhale 2 puffs into the lungs in the morning and at bedtime. 09/13/19   [provider]  prochlorperazine (COMPAZINE) 10 MG tablet Take 1 tablet (10 mg total) by mouth every 6 (six) hours as needed (Nausea or vomiting). 05/30/17 09/05/17  Gabriel Huger, MD  Family History  Problem Relation Age of Onset  . Stroke Mother   . Diabetes Mother   . Hypertension Mother   . Diabetes Father   . Hypertension Father   . Diabetes Sister   . Diabetes Brother   . Heart attack Paternal Uncle   . Stroke Maternal Grandmother     Social History   Socioeconomic History  . Marital status: Divorced    Spouse name: Not on file  . Number of children: Not on file  . Years of education: Not on file  . Highest education level: Not on file  Occupational History  . Not on file    Tobacco Use  . Smoking status: Current Some Day Smoker    Packs/day: 0.50  . Smokeless tobacco: Never Used  Substance and Sexual Activity  . Alcohol use: No  . Drug use: No  . Sexual activity: Not on file  Other Topics Concern  . Not on file  Social History Narrative  . Not on file   Social Determinants of Health   Financial Resource Strain:   . Difficulty of Paying Living Expenses:   Food Insecurity:   . Worried About Charity fundraiser in the Last Year:   . Arboriculturist in the Last Year:   Transportation Needs:   . Film/video editor (Medical):   Marland Kitchen Lack of Transportation (Non-Medical):   Physical Activity:   . Days of Exercise per Week:   . Minutes of Exercise per Session:   Stress:   . Feeling of Stress :   Social Connections:   . Frequency of Communication with Friends and Family:   . Frequency of Social Gatherings with Friends and Family:   . Attends Religious Services:   . Active Member of Clubs or Organizations:   . Attends Archivist Meetings:   Marland Kitchen Marital Status:      Review of Systems: A 12 point ROS discussed and pertinent positives are indicated in the HPI above.  All other systems are negative.  Review of Systems  Constitutional: Negative for fatigue and fever.  Respiratory: Positive for cough. Negative for shortness of breath.   Cardiovascular: Negative for chest pain.  Gastrointestinal: Negative for abdominal pain.  Musculoskeletal: Negative for back pain.  Psychiatric/Behavioral: Negative for behavioral problems and confusion.    Vital Signs: There were no vitals taken for this visit.  Physical Exam Vitals and nursing note reviewed.  Constitutional:      General: He is not in acute distress.    Appearance: Normal appearance. He is not ill-appearing.  HENT:     Mouth/Throat:     Mouth: Mucous membranes are moist.     Pharynx: Oropharynx is clear.  Neck:     Comments: No palpable adenopathy Cardiovascular:     Rate and  Rhythm: Normal rate.  Pulmonary:     Effort: Pulmonary effort is normal.  Abdominal:     General: Abdomen is flat.     Palpations: Abdomen is soft.  Lymphadenopathy:     Cervical: No cervical adenopathy.  Skin:    General: Skin is warm and dry.  Neurological:     General: No focal deficit present.     Mental Status: He is alert and oriented to person, place, and time. Mental status is at baseline.  Psychiatric:        Mood and Affect: Mood normal.        Behavior: Behavior normal.  Thought Content: Thought content normal.        Judgment: Judgment normal.      Imaging: CT CHEST W CONTRAST  Result Date: 09/08/2019 CLINICAL DATA:  Follow-up lung cancer. Shortness of breath, wheezing. EXAM: CT CHEST WITH CONTRAST TECHNIQUE: Multidetector CT imaging of the chest was performed during intravenous contrast administration. CONTRAST:  41mL OMNIPAQUE IOHEXOL 300 MG/ML  SOLN COMPARISON:  03/04/2019. FINDINGS: Cardiovascular: Heart is normal in size.  No pericardial effusion. No evidence of thoracic aortic aneurysm. Atherosclerotic calcifications of the aortic arch. Three vessel coronary atherosclerosis. Mediastinum/Nodes: No suspicious mediastinal lymphadenopathy. Mild residual soft tissue thickening in the right perihilar region (series 3/image 33), without discrete mass. No suspicious axillary lymphadenopathy. 15 mm short axis right supraclavicular node (series 2/image 13), previously 13 mm. Additional 2.0 cm short axis node at the right thoracic inlet (series 2/image 23), previously 14 mm. Lungs/Pleura: Radiation changes in the central right upper lobe/perihilar region. Associated mild volume loss. Mild centrilobular and paraseptal emphysematous changes, upper lung predominant. No new/suspicious pulmonary nodules. Mild scarring in the lingula. No focal consolidation. No pleural effusion or pneumothorax. Upper Abdomen: Visualized upper abdomen is notable for stable mild nodular thickening of  the right adrenal gland and a 1.9 cm stable left adrenal nodule which was previously characterized as a benign adrenal adenoma. Musculoskeletal: Visualized osseous structures are within normal limits. IMPRESSION: Progressive lymphadenopathy in the right supraclavicular region and right thoracic inlet, compatible with nodal metastases/recurrence. Radiation changes in the right hemithorax, as above. Aortic Atherosclerosis (ICD10-I70.0) and Emphysema (ICD10-J43.9). Electronically Signed   By: Julian Hy M.D.   On: 09/08/2019 12:54   NM PET Image Restag (PS) Skull Base To Thigh  Result Date: 09/27/2019 CLINICAL DATA:  Subsequent treatment strategy for right lung squamous cell carcinoma with enlarging right thoracic inlet adenopathy on recent chest CT. EXAM: NUCLEAR MEDICINE PET SKULL BASE TO THIGH TECHNIQUE: 11.4 mCi F-18 FDG was injected intravenously. Full-ring PET imaging was performed from the skull base to thigh after the radiotracer. CT data was obtained and used for attenuation correction and anatomic localization. Fasting blood glucose: 209 mg/dl COMPARISON:  09/08/2017 PET-CT.  09/08/2019 chest CT. FINDINGS: Mediastinal blood pool activity: SUV max 2.1 Liver activity: SUV max NA NECK: New hypermetabolic 1.7 cm short axis diameter right supraclavicular node with max SUV 6.9 (series 3/image 66). No additional hypermetabolic lymph nodes in the neck. Hypermetabolic posterior left parotid 1.1 cm soft tissue nodule with max SUV 4.9 (series 3/image 31) not appreciably changed since 09/08/2017 PET-CT. Incidental CT findings: none CHEST: Poorly marginated 2.5 cm short axis diameter hypermetabolic lymph node at the right thoracic inlet with max SUV 6.5 (series 3/image 72), new from prior PET-CT. No additional hypermetabolic lymph nodes in the mediastinum. No hypermetabolic axillary or hilar lymph nodes. No hypermetabolic pulmonary findings. Stable sharply marginated consolidation with associated volume loss  and distortion in the parahilar right lung with no evidence of recurrent hypermetabolism in this location. Incidental CT findings: Coronary atherosclerosis. Atherosclerotic nonaneurysmal thoracic aorta. Mild centrilobular and paraseptal emphysema. Patchy tree-in-bud type opacities in the right middle lobe and anterior peripheral right lower lobe and lingula, new. ABDOMEN/PELVIS: No abnormal hypermetabolic activity within the liver, pancreas, adrenal glands, or spleen. No hypermetabolic lymph nodes in the abdomen or pelvis. Nonspecific focal hypermetabolism at the anorectal junction without CT correlate, unchanged from prior, more likely physiologic. Incidental CT findings: Non hypermetabolic 1.4 cm left adrenal nodule with density -6 HU, compatible with a benign adenoma. Simple 3.1 cm lateral lower  left renal cyst. Atherosclerotic nonaneurysmal thoracic aorta. Mild right colonic diverticulosis. Mild prostatomegaly. SKELETON: No focal hypermetabolic activity to suggest skeletal metastasis. Incidental CT findings: none IMPRESSION: 1. Hypermetabolic right supraclavicular and right thoracic inlet lymphadenopathy, compatible with recurrent metastatic disease. No additional sites of hypermetabolic metastatic disease. 2. Stable post treatment changes in the perihilar right lung with no hypermetabolic locally recurrent disease. 3. 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 4. Nonspecific patchy tree-in-bud opacities in the mid lungs bilaterally, new since recent 09/08/2019 chest CT study, most compatible with a nonspecific mild bronchiolitis. 5. Stable left adrenal adenoma. 6. Aortic Atherosclerosis (ICD10-I70.0) and Emphysema (ICD10-J43.9). Electronically Signed   By: Ilona Sorrel M.D.   On: 09/27/2019 11:25    Labs:  CBC: Recent Labs    12/01/18 0909  WBC 11.2*  HGB 15.6  HCT  46.9  PLT 271    COAGS: No results for input(s): INR, APTT in the last 8760 hours.  BMP: Recent Labs    12/01/18 0909 03/04/19 0924 09/08/19 1008  NA 134*  --   --   K 4.1  --   --   CL 96*  --   --   CO2 26  --   --   GLUCOSE 251*  --   --   BUN 11  --   --   CALCIUM 8.9  --   --   CREATININE 0.78 0.70 0.90  GFRNONAA >60  --   --   GFRAA >60  --   --     LIVER FUNCTION TESTS: Recent Labs    12/01/18 0909  BILITOT 0.7  AST 17  ALT 22  ALKPHOS 95  PROT 7.0  ALBUMIN 3.9    TUMOR MARKERS: No results for input(s): AFPTM, CEA, CA199, CHROMGRNA in the last 8760 hours.  Assessment and Plan: Patient with past medical history of squamous cell lung cancer s/p completion of chemoradiation 3 years ago who presents with complaint of lymphadenopathy and concern for recurrent disease.  IR consulted for lymph node biopsy at the request of Dr. Grayland Ormond. Case reviewed by Dr. Anselm Pancoast who approves patient for procedure.  Patient presents today in their usual state of health. He wishes to proceed without sedation today.   Risks and benefits was discussed with the patient and/or patient's family including, but not limited to bleeding, infection, damage to adjacent structures or low yield requiring additional tests.  All of the questions were answered and there is agreement to proceed.  Consent signed and in chart.  Thank you for this interesting consult.  I greatly enjoyed meeting [employer] and look forward to participating in their care.  A copy of this report was sent to the requesting provider on this date.  Electronically Signed: Docia Barrier, PA 10/04/2019, 10:19 AM   I spent a total of  10 minutes   in face to face in clinical consultation, greater than 50% of which was counseling/coordinating care for  lymphadenopathy.

## 2019-10-06 LAB — SURGICAL PATHOLOGY

## 2019-10-09 NOTE — Progress Notes (Signed)
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  INTERVAL HISTORY: Patient returns to clinic today for further evaluation, discussion of his biopsy results, and treatment planning.  He has chronic cough, but otherwise feels well.  He continues to remain active and work full-time. He has no neurologic complaints. He has a good appetite and denies weight loss.  He denies any chest pain, cough, hemoptysis, or shortness of breath.  He has no nausea, vomiting, constipation, or diarrhea.  He has no urinary complaints.  Patient offers no further specific complaints today.  REVIEW OF SYSTEMS:   Review of Systems  Constitutional: Negative.  Negative for fever, malaise/fatigue and weight loss.  HENT: Negative.  Negative for congestion.   Respiratory: Positive for cough. Negative for hemoptysis and shortness of breath.   Cardiovascular: Negative.  Negative for chest pain and leg swelling.  Gastrointestinal: Negative.  Negative for abdominal pain and heartburn.  Genitourinary: Negative.  Negative for dysuria.  Musculoskeletal: Negative.  Negative for joint pain.  Skin: Negative.  Negative for rash.  Neurological: Negative.  Negative for dizziness, sensory change, focal weakness, weakness and headaches.  Psychiatric/Behavioral: Negative.  The patient is not nervous/anxious.     As per HPI. Otherwise, a complete review of systems is negative.  PAST MEDICAL HISTORY: Past Medical History:  Diagnosis Date  . Asthma   . COPD (chronic obstructive pulmonary disease) ([place])   . Diabetes mellitus without  complication ([place])   . Hypertension   . Lung cancer ([place]) 05/2017   Hx Chemo + rad tx's.  . Pneumonia     PAST SURGICAL HISTORY: Past Surgical History:  Procedure Laterality Date  . ENDOBRONCHIAL ULTRASOUND N/A 05/12/2017   Procedure: ENDOBRONCHIAL ULTRASOUND;  Surgeon: Laverle Hobby, MD;  Location: [facility] ORS;  Service: Pulmonary;  Laterality: N/A;  . FRACTURE SURGERY Left    ANKLE X 2  . TONSILLECTOMY      FAMILY HISTORY: Family History  Problem Relation Age of Onset  . Stroke Mother   . Diabetes Mother   . Hypertension Mother   . Diabetes Father   . Hypertension Father   . Diabetes Sister   . Diabetes Brother   . Heart attack Paternal Uncle   . Stroke Maternal Grandmother     ADVANCED DIRECTIVES (Y/N):  N  HEALTH MAINTENANCE: Social History   Tobacco Use  . Smoking status: Current Some Day Smoker    Packs/day: 0.50  . Smokeless tobacco: Never Used  Substance Use Topics  . Alcohol use: No  . Drug use: No     Colonoscopy:  PAP:  Bone density:  Lipid panel:  Allergies  Allergen Reactions  . Shrimp [Shellfish Allergy] Nausea And Vomiting    Current Outpatient Medications  Medication Sig Dispense Refill  . albuterol (PROVENTIL) (2.5 MG/3ML) 0.083% nebulizer solution Take 2.5 mg every 6 (six) hours as needed by nebulization for wheezing or shortness of breath.    Marland Kitchen albuterol (VENTOLIN HFA) 108 (90 Base) MCG/ACT inhaler Inhale 2 puffs into the lungs every 6 (six) hours as needed for wheezing or shortness of breath. 1 Inhaler 0  . amLODipine (NORVASC) 5 MG tablet Take  5 mg by mouth daily.    Jearl Klinefelter ELLIPTA 62.5-25 MCG/INH AEPB INHALE 1 PUFF INTO THE LUNGS DAILY 60 each 2  . atorvastatin (LIPITOR) 20 MG tablet Take 20 mg by mouth daily.    Marland Kitchen glipiZIDE (GLUCOTROL) 5 MG tablet Take 10 mg by mouth daily before breakfast.     . ibuprofen (ADVIL,MOTRIN) 200 MG tablet Take 400-800 mg every 8 (eight) hours as needed by mouth (for pain.).    Marland Kitchen  lisinopril-hydrochlorothiazide (ZESTORETIC) 20-25 MG tablet Take 1 tablet by mouth daily. (Patient taking differently: Take 2 tablets by mouth daily. ) 30 tablet 0  . metFORMIN (GLUCOPHAGE) 1000 MG tablet Take 1 tablet (1,000 mg total) by mouth 2 (two) times daily. 60 tablet 0  . omeprazole (PRILOSEC) 20 MG capsule TAKE 1 CAPSULE(20 MG) BY MOUTH DAILY 30 capsule 2  . SYMBICORT 160-4.5 MCG/ACT inhaler Inhale 2 puffs into the lungs in the morning and at bedtime.     No current facility-administered medications for this visit.    OBJECTIVE: There were no vitals filed for this visit.   There is no height or weight on file to calculate BMI.    ECOG FS:0 - Asymptomatic  General: Well-developed, well-nourished, no acute distress. Eyes: Pink conjunctiva, anicteric sclera. HEENT: Normocephalic, moist mucous membranes. Lungs: No audible wheezing or coughing. Heart: Regular rate and rhythm. Abdomen: Soft, nontender, no obvious distention. Musculoskeletal: No edema, cyanosis, or clubbing. Neuro: Alert, answering all questions appropriately. Cranial nerves grossly intact. Skin: No rashes or petechiae noted. Psych: Normal affect.   LAB RESULTS:  Lab Results  Component Value Date   NA 134 (L) 12/01/2018   K 4.1 12/01/2018   CL 96 (L) 12/01/2018   CO2 26 12/01/2018   GLUCOSE 251 (H) 12/01/2018   BUN 11 12/01/2018   CREATININE 0.90 09/08/2019   CALCIUM 8.9 12/01/2018   PROT 7.0 12/01/2018   ALBUMIN 3.9 12/01/2018   AST 17 12/01/2018   ALT 22 12/01/2018   ALKPHOS 95 12/01/2018   BILITOT 0.7 12/01/2018   GFRNONAA >60 12/01/2018   GFRAA >60 12/01/2018    Lab Results  Component Value Date   WBC 11.2 (H) 12/01/2018   NEUTROABS 8.5 (H) 12/01/2018   HGB 15.6 12/01/2018   HCT 46.9 12/01/2018   MCV 86.7 12/01/2018   PLT 271 12/01/2018     STUDIES: NM PET Image Restag (PS) Skull Base To Thigh  Result Date: 09/27/2019 CLINICAL DATA:  Subsequent treatment strategy for right lung  squamous cell carcinoma with enlarging right thoracic inlet adenopathy on recent chest CT. EXAM: NUCLEAR MEDICINE PET SKULL BASE TO THIGH TECHNIQUE: 11.4 mCi F-18 FDG was injected intravenously. Full-ring PET imaging was performed from the skull base to thigh after the radiotracer. CT data was obtained and used for attenuation correction and anatomic localization. Fasting blood glucose: 209 mg/dl COMPARISON:  09/08/2017 PET-CT.  09/08/2019 chest CT. FINDINGS: Mediastinal blood pool activity: SUV max 2.1 Liver activity: SUV max NA NECK: New hypermetabolic 1.7 cm short axis diameter right supraclavicular node with max SUV 6.9 (series 3/image 66). No additional hypermetabolic lymph nodes in the neck. Hypermetabolic posterior left parotid 1.1 cm soft tissue nodule with max SUV 4.9 (series 3/image 31) not appreciably changed since 09/08/2017 PET-CT. Incidental CT findings: none CHEST: Poorly marginated 2.5 cm short axis diameter hypermetabolic lymph node at the right thoracic inlet with max SUV 6.5 (series 3/image 72), new from prior PET-CT. No additional hypermetabolic lymph nodes in the mediastinum. No hypermetabolic axillary or  hilar lymph nodes. No hypermetabolic pulmonary findings. Stable sharply marginated consolidation with associated volume loss and distortion in the parahilar right lung with no evidence of recurrent hypermetabolism in this location. Incidental CT findings: Coronary atherosclerosis. Atherosclerotic nonaneurysmal thoracic aorta. Mild centrilobular and paraseptal emphysema. Patchy tree-in-bud type opacities in the right middle lobe and anterior peripheral right lower lobe and lingula, new. ABDOMEN/PELVIS: No abnormal hypermetabolic activity within the liver, pancreas, adrenal glands, or spleen. No hypermetabolic lymph nodes in the abdomen or pelvis. Nonspecific focal hypermetabolism at the anorectal junction without CT correlate, unchanged from prior, more likely physiologic. Incidental CT  findings: Non hypermetabolic 1.4 cm left adrenal nodule with density -6 HU, compatible with a benign adenoma. Simple 3.1 cm lateral lower left renal cyst. Atherosclerotic nonaneurysmal thoracic aorta. Mild right colonic diverticulosis. Mild prostatomegaly. SKELETON: No focal hypermetabolic activity to suggest skeletal metastasis. Incidental CT findings: none IMPRESSION: 1. Hypermetabolic right supraclavicular and right thoracic inlet lymphadenopathy, compatible with recurrent metastatic disease. No additional sites of hypermetabolic metastatic disease. 2. Stable post treatment changes in the perihilar right lung with no hypermetabolic locally recurrent disease. 3. 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 4. Nonspecific patchy tree-in-bud opacities in the mid lungs bilaterally, new since recent 09/08/2019 chest CT study, most compatible with a nonspecific mild bronchiolitis. 5. Stable left adrenal adenoma. 6. Aortic Atherosclerosis (ICD10-I70.0) and Emphysema (ICD10-J43.9). Electronically Signed   By: Ilona Sorrel M.D.   On: 09/27/2019 11:25   [place] CORE BIOPSY (LYMPH NODES)  Result Date: 10/04/2019 INDICATION: LUNG CANCER, RIGHT SUPRACLAVICULAR METASTATIC ADENOPATHY BY PET-CT EXAM: ULTRASOUND GUIDED CORE BIOPSY OF RIGHT SUPRACLAVICULAR ADENOPATHY MEDICATIONS: 1% LIDOCAINE LOCAL ANESTHESIA/SEDATION: Moderate Sedation Time:  None. The patient was continuously monitored during the procedure by the interventional radiology nurse under my direct supervision. FLUOROSCOPY TIME:  Fluoroscopy Time: None. COMPLICATIONS: None immediate. PROCEDURE: The procedure, risks, benefits, and alternatives were explained to the patient. Questions regarding the procedure were encouraged and answered. The patient understands and consents to the procedure. The right neck was prepped with ChloraPrep in a  sterile fashion, and a sterile drape was applied covering the operative field. A sterile gown and sterile gloves were used for the procedure. Local anesthesia was provided with 1% Lidocaine. Previous imaging reviewed. Preliminary ultrasound performed. The right supraclavicular adenopathy was localized and marked. This was correlated with the PET-CT. Under sterile conditions and local anesthesia, an 18 gauge core biopsy was advanced to the right supraclavicular adenopathy. 4 18 gauge core biopsies obtained. Samples were intact and non fragmented. These were placed in formalin. Needle removed. Postprocedure imaging demonstrates no hemorrhage or hematoma. Patient tolerated biopsy well. FINDINGS: Imaging confirms needle placed in the right supraclavicular adenopathy for 18 gauge core biopsy IMPRESSION: Successful ultrasound right supraclavicular adenopathy 18 gauge core biopsies Electronically Signed   By: Jerilynn Mages.  Shick M.D.   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  PLAN:    1.  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  He subsequently completed 1 year of maintenance durvalumab on August 27, 2018.  His most recent CT scan on September 08, 2019 reviewed independently with progressive lymphadenopathy in his right supraclavicular and right thoracic inlet.  PET scan completed on September 27, 2019 reviewed independently report as above confirming likely recurrence.  Biopsy consistent with squamous cell carcinoma.  PD-L1 and molecular markers are pending at time of dictation.  He had consultation with radiation oncology today and plans to proceed with SBRT.  Return to clinic in 4 weeks after his radiation treatments are finished for routine evaluation.  2.  Hypertension: Chronic and unchanged.  Continue evaluation and treatment per primary care.   Patient expressed  understanding and was in agreement with this plan. He also understands that He can call clinic at any time with any questions, concerns, or complaints.   Cancer Staging Squamous cell lung cancer, left [facility]) Staging form: Lung, AJCC 8th Edition - Clinical stage from 05/16/2017: Stage IIIB (cT3, cN2, cM0) - Signed by Lloyd Huger, MD on 05/16/2017   Lloyd Huger, MD   10/14/2019 7:02 AM

## 2019-10-12 ENCOUNTER — Encounter: Payer: Self-pay | Admitting: Oncology

## 2019-10-12 ENCOUNTER — Other Ambulatory Visit: Payer: Self-pay

## 2019-10-12 NOTE — Progress Notes (Signed)
Patient prescreened for appointment. Patient has no concerns or questions.  

## 2019-10-13 ENCOUNTER — Inpatient Hospital Stay (HOSPITAL_BASED_OUTPATIENT_CLINIC_OR_DEPARTMENT_OTHER): Payer: 59 | Admitting: Oncology

## 2019-10-13 ENCOUNTER — Ambulatory Visit
Admission: RE | Admit: 2019-10-13 | Discharge: 2019-10-13 | Disposition: A | Discharge status: Home / Self Care | Payer: 59 | Source: Ambulatory Visit | Attending: Radiation Oncology | Admitting: Radiation Oncology

## 2019-10-13 ENCOUNTER — Encounter: Payer: Self-pay | Admitting: Radiation Oncology

## 2019-10-13 VITALS — BP 134/92 | HR 106 | Temp 95.8°F | Wt 208.0 lb

## 2019-10-13 DIAGNOSIS — C3492 Malignant neoplasm of unspecified part of left bronchus or lung: Secondary | ICD-10-CM | POA: Diagnosis not present

## 2019-10-13 DIAGNOSIS — M25511 Pain in right shoulder: Secondary | ICD-10-CM | POA: Diagnosis not present

## 2019-10-13 DIAGNOSIS — Z9221 Personal history of antineoplastic chemotherapy: Secondary | ICD-10-CM | POA: Diagnosis not present

## 2019-10-13 DIAGNOSIS — Z923 Personal history of irradiation: Secondary | ICD-10-CM | POA: Diagnosis not present

## 2019-10-13 DIAGNOSIS — C3412 Malignant neoplasm of upper lobe, left bronchus or lung: Secondary | ICD-10-CM | POA: Diagnosis not present

## 2019-10-13 DIAGNOSIS — C7951 Secondary malignant neoplasm of bone: Secondary | ICD-10-CM | POA: Insufficient documentation

## 2019-10-13 DIAGNOSIS — F17210 Nicotine dependence, cigarettes, uncomplicated: Secondary | ICD-10-CM | POA: Diagnosis not present

## 2019-10-13 NOTE — Progress Notes (Signed)
Radiation Oncology Follow up Note  Name: Gabriel Mendez   Date:   10/13/2019 MRN:  [id number] DOB: 12-Feb-1961    This 59 y.o. male presents to the clinic today for follow-up status post biopsy of right supraclavicular fossa in patient with recurrent squamous cell carcinoma of the right lung status post concurrent chemoradiation therapy for stage IIIb squamous cell 3 years prior.  REFERRING PROVIDER: Lynnell Jude, MD  HPI: Patient is a 59 year old male who had progressive findings in the right supraclavicular fossa on follow-up CT scans 3 years out from concurrent chemoradiation for stage IIIb squamous cell carcinoma the right lung.  PET scan showed hypermetabolic activity in this area and he underwent biopsy which was positive for squamous cell carcinoma.  He is having some right shoulder pain..  Patient is otherwise asymptomatic.  PET scan shows no other other evidence of distant disease or any other areas of involvement of the chest. COMPLICATIONS OF TREATMENT: none  FOLLOW UP COMPLIANCE: keeps appointments   PHYSICAL EXAM:  BP (!) 134/92 (BP Location: Left Arm, Patient Position: Sitting, Cuff Size: Normal)   Pulse (!) 106   Temp (!) 95.8 F (35.4 C) (Tympanic)   Wt 208 lb (94.3 kg)   SpO2 96% Comment: room aitr  BMI 31.63 kg/m  Well-developed well-nourished patient in NAD. HEENT reveals PERLA, EOMI, discs not visualized.  Oral cavity is clear. No oral mucosal lesions are identified. Neck is clear without evidence of cervical or supraclavicular adenopathy. Lungs are clear to A&P. Cardiac examination is essentially unremarkable with regular rate and rhythm without murmur rub or thrill. Abdomen is benign with no organomegaly or masses noted. Motor sensory and DTR levels are equal and symmetric in the upper and lower extremities. Cranial nerves II through XII are grossly intact. Proprioception is intact. No peripheral adenopathy or edema is identified. No motor or sensory levels are noted.  Crude visual fields are within normal range.  RADIOLOGY RESULTS: PET CT scan reviewed compatible with above-stated findings Pathology report reviewed  PLAN: At this time like to go ahead with SBRT to his right supraclavicular fossa.  Would plan on delivering 6000 cGy in five fractions.  Will hopefully meet dose constraints of his supraclavicular brachial plexus.  Risks and benefits of treatment including possible dysphagia from radiation esophagitis fatigue skin reaction alteration of blood counts all were discussed in detail with the patient.  He seems to comprehend my treatment plan well.  I have personally set up and ordered CT simulation in about a week's time.  We will use PET CT fusion study.  Patient comprehends my treatment plan well.  I would like to take this opportunity to thank you for allowing me to participate in the care of your patient.[name], MD

## 2019-10-14 ENCOUNTER — Encounter: Payer: Self-pay | Admitting: Oncology

## 2019-10-22 ENCOUNTER — Ambulatory Visit: Payer: 59

## 2019-10-22 DIAGNOSIS — C7951 Secondary malignant neoplasm of bone: Secondary | ICD-10-CM | POA: Diagnosis present

## 2019-10-22 DIAGNOSIS — C3492 Malignant neoplasm of unspecified part of left bronchus or lung: Secondary | ICD-10-CM | POA: Diagnosis not present

## 2019-10-22 DIAGNOSIS — Z510 Encounter for antineoplastic radiation therapy: Secondary | ICD-10-CM | POA: Diagnosis not present

## 2019-10-28 DIAGNOSIS — C7951 Secondary malignant neoplasm of bone: Secondary | ICD-10-CM | POA: Diagnosis not present

## 2019-10-29 ENCOUNTER — Other Ambulatory Visit: Payer: Self-pay | Admitting: *Deleted

## 2019-11-02 ENCOUNTER — Ambulatory Visit: Admission: RE | Admit: 2019-11-02 | Payer: 59 | Source: Ambulatory Visit

## 2019-11-02 ENCOUNTER — Other Ambulatory Visit: Payer: Self-pay | Admitting: *Deleted

## 2019-11-02 ENCOUNTER — Ambulatory Visit: Payer: 59

## 2019-11-02 ENCOUNTER — Other Ambulatory Visit: Payer: Self-pay | Admitting: Oncology

## 2019-11-02 MED ORDER — ALPRAZOLAM 0.5 MG PO TABS: 0.5000 mg | ORAL_TABLET | ORAL | 0 refills | Status: DC | PRN

## 2019-11-02 NOTE — Progress Notes (Signed)
RX Xanax 0.5 mg tab ordered for patient to take prior to radiation treatment per Dr. Marval Regal, NP 11/02/2019 10:38 AM

## 2019-11-03 ENCOUNTER — Ambulatory Visit
Admission: RE | Admit: 2019-11-03 | Discharge: 2019-11-03 | Disposition: A | Discharge status: Home / Self Care | Payer: 59 | Source: Ambulatory Visit | Attending: Radiation Oncology | Admitting: Radiation Oncology

## 2019-11-03 DIAGNOSIS — C7951 Secondary malignant neoplasm of bone: Secondary | ICD-10-CM | POA: Diagnosis not present

## 2019-11-04 ENCOUNTER — Ambulatory Visit: Payer: 59

## 2019-11-04 ENCOUNTER — Ambulatory Visit
Admission: RE | Admit: 2019-11-04 | Discharge: 2019-11-04 | Disposition: A | Discharge status: Home / Self Care | Payer: 59 | Source: Ambulatory Visit | Attending: Radiation Oncology | Admitting: Radiation Oncology

## 2019-11-04 DIAGNOSIS — C7951 Secondary malignant neoplasm of bone: Secondary | ICD-10-CM | POA: Diagnosis not present

## 2019-11-05 ENCOUNTER — Ambulatory Visit
Admission: RE | Admit: 2019-11-05 | Discharge: 2019-11-05 | Disposition: A | Discharge status: Home / Self Care | Payer: 59 | Source: Ambulatory Visit | Attending: Radiation Oncology | Admitting: Radiation Oncology

## 2019-11-05 DIAGNOSIS — C7951 Secondary malignant neoplasm of bone: Secondary | ICD-10-CM | POA: Diagnosis not present

## 2019-11-06 NOTE — Progress Notes (Signed)
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  INTERVAL HISTORY: Patient returns to clinic today for further evaluation and assess his toleration of XRT.  He has approximately 3 or 4 treatments left.  He has increased weakness and fatigue as well as shortness of breath.  He continues to have a chronic cough that is unchanged.  Despite this, he remains active and is working full-time.  He has no neurologic complaints. He has a good appetite and denies weight loss.  He denies any chest pain, cough, hemoptysis, or shortness of breath.  He has no nausea, vomiting, constipation, or diarrhea.  He has no urinary complaints.  Patient offers no further specific complaints today.  REVIEW OF SYSTEMS:   Review of Systems  Constitutional: Positive for malaise/fatigue. Negative for fever and weight loss.  HENT: Negative.  Negative for congestion.   Respiratory: Positive for cough and shortness of breath. Negative for hemoptysis.   Cardiovascular: Negative.  Negative for chest pain and leg swelling.  Gastrointestinal: Negative.  Negative for abdominal pain and heartburn.  Genitourinary: Negative.  Negative for dysuria.  Musculoskeletal: Negative.  Negative for joint pain.  Skin: Negative.  Negative for rash.  Neurological: Positive for weakness. Negative for dizziness, sensory change, focal weakness and headaches.  Psychiatric/Behavioral: Negative.  The patient is not nervous/anxious.     As per HPI. Otherwise, a complete review of systems is negative.  PAST  MEDICAL HISTORY: Past Medical History:  Diagnosis Date  . Asthma   . COPD (chronic obstructive pulmonary disease) ([place])   . Diabetes mellitus without complication ([place])   . Hypertension   . Lung cancer ([place]) 05/2017   Hx Chemo + rad tx's.  . Pneumonia     PAST SURGICAL HISTORY: Past Surgical History:  Procedure Laterality Date  . ENDOBRONCHIAL ULTRASOUND N/A 05/12/2017   Procedure: ENDOBRONCHIAL ULTRASOUND;  Surgeon: Laverle Hobby, MD;  Location: [facility] ORS;  Service: Pulmonary;  Laterality: N/A;  . FRACTURE SURGERY Left    ANKLE X 2  . TONSILLECTOMY      FAMILY HISTORY: Family History  Problem Relation Age of Onset  . Stroke Mother   . Diabetes Mother   . Hypertension Mother   . Diabetes Father   . Hypertension Father   . Diabetes Sister   . Diabetes Brother   . Heart attack Paternal Uncle   . Stroke Maternal Grandmother     ADVANCED DIRECTIVES (Y/N):  N  HEALTH MAINTENANCE: Social History   Tobacco Use  . Smoking status: Current Some Day Smoker    Packs/day: 0.50  . Smokeless tobacco: Never Used  Substance Use Topics  . Alcohol use: No  . Drug use: No     Colonoscopy:  PAP:  Bone density:  Lipid panel:  Allergies  Allergen Reactions  . Shrimp [Shellfish Allergy] Nausea And Vomiting    Current Outpatient Medications  Medication Sig Dispense Refill  . albuterol (PROVENTIL) (2.5 MG/3ML) 0.083% nebulizer solution Take 2.5 mg every 6 (six) hours as needed by nebulization for wheezing or shortness of breath.    Marland Kitchen albuterol (VENTOLIN HFA) 108 (90 Base)  MCG/ACT inhaler Inhale 2 puffs into the lungs every 6 (six) hours as needed for wheezing or shortness of breath. 1 Inhaler 0  . ALPRAZolam (XANAX) 0.5 MG tablet Take 1 tablet (0.5 mg total) by mouth as needed for anxiety (Before radiation treatments). 10 tablet 0  . amLODipine (NORVASC) 5 MG tablet Take 5 mg by mouth daily.    Jearl Klinefelter ELLIPTA 62.5-25 MCG/INH AEPB INHALE 1 PUFF INTO THE LUNGS DAILY 60  each 2  . atorvastatin (LIPITOR) 20 MG tablet Take 20 mg by mouth daily.    Marland Kitchen glipiZIDE (GLUCOTROL) 5 MG tablet Take 10 mg by mouth daily before breakfast.     . ibuprofen (ADVIL,MOTRIN) 200 MG tablet Take 400-800 mg every 8 (eight) hours as needed by mouth (for pain.).    Marland Kitchen lisinopril-hydrochlorothiazide (ZESTORETIC) 20-25 MG tablet Take 1 tablet by mouth daily. (Patient taking differently: Take 2 tablets by mouth daily. ) 30 tablet 0  . metFORMIN (GLUCOPHAGE) 1000 MG tablet Take 1 tablet (1,000 mg total) by mouth 2 (two) times daily. 60 tablet 0  . omeprazole (PRILOSEC) 20 MG capsule TAKE 1 CAPSULE(20 MG) BY MOUTH DAILY 30 capsule 2  . SYMBICORT 160-4.5 MCG/ACT inhaler Inhale 2 puffs into the lungs in the morning and at bedtime.     No current facility-administered medications for this visit.    OBJECTIVE: Vitals:   11/12/19 1110  BP: 124/85  Pulse: (!) 110  Resp: 20  Temp: 97.6 F (36.4 C)     Body mass index is 31.9 kg/m.    ECOG FS:1 - Symptomatic but completely ambulatory  General: Well-developed, well-nourished, no acute distress. Eyes: Pink conjunctiva, anicteric sclera. HEENT: Normocephalic, moist mucous membranes. Lungs: No audible wheezing or coughing. Heart: Regular rate and rhythm. Abdomen: Soft, nontender, no obvious distention. Musculoskeletal: No edema, cyanosis, or clubbing. Neuro: Alert, answering all questions appropriately. Cranial nerves grossly intact. Skin: No rashes or petechiae noted. Psych: Normal affect.  LAB RESULTS:  Lab Results  Component Value Date   NA 129 (L) 11/12/2019   K 3.8 11/12/2019   CL 89 (L) 11/12/2019   CO2 27 11/12/2019   GLUCOSE 339 (H) 11/12/2019   BUN 18 11/12/2019   CREATININE 0.99 11/12/2019   CALCIUM 8.9 11/12/2019   PROT 6.9 11/12/2019   ALBUMIN 3.9 11/12/2019   AST 17 11/12/2019   ALT 23 11/12/2019   ALKPHOS 96 11/12/2019   BILITOT 0.6 11/12/2019   GFRNONAA >60 11/12/2019   GFRAA >60 11/12/2019    Lab Results   Component Value Date   WBC 7.7 11/12/2019   NEUTROABS 6.0 11/12/2019   HGB 15.3 11/12/2019   HCT 44.1 11/12/2019   MCV 85.1 11/12/2019   PLT 239 11/12/2019     STUDIES: No results found.  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  PLAN:    1.  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  He subsequently completed 1 year of maintenance durvalumab on August 27, 2018.  His most recent CT scan on September 08, 2019 reviewed independently with progressive lymphadenopathy in his right supraclavicular and right thoracic inlet.  PET scan completed on September 27, 2019 reviewed independently confirming likely recurrence.  Biopsy consistent with squamous cell carcinoma.  He is now undergoing XRT for his local recurrence and will complete treatment on November 17, 2019.  Repeat PET scan in mid July for restaging purposes patient will follow up 1 to 2 days later.   2.  Hypertension: Chronic and unchanged.  Continue evaluation and treatment per primary care. 3.  Shortness of breath/cough: Possibly radiation pneumonitis.  Patient was offered a prescription for prednisone which he declined.   Patient expressed understanding and was in agreement with this plan. He also understands that He can call clinic at any time with any questions, concerns, or complaints.   Cancer Staging Squamous cell lung cancer, left [facility]) Staging form: Lung, AJCC 8th Edition - Clinical stage from 05/16/2017: Stage IIIB (cT3, cN2, cM0) - Signed by Lloyd Huger, MD on 05/16/2017   Lloyd Huger, MD   11/12/2019 12:40 PM

## 2019-11-08 ENCOUNTER — Ambulatory Visit
Admission: RE | Admit: 2019-11-08 | Discharge: 2019-11-08 | Disposition: A | Discharge status: Home / Self Care | Payer: 59 | Source: Ambulatory Visit | Attending: Radiation Oncology | Admitting: Radiation Oncology

## 2019-11-08 DIAGNOSIS — C7951 Secondary malignant neoplasm of bone: Secondary | ICD-10-CM | POA: Diagnosis not present

## 2019-11-09 ENCOUNTER — Ambulatory Visit
Admission: RE | Admit: 2019-11-09 | Discharge: 2019-11-09 | Disposition: A | Discharge status: Home / Self Care | Payer: 59 | Source: Ambulatory Visit | Attending: Radiation Oncology | Admitting: Radiation Oncology

## 2019-11-09 ENCOUNTER — Ambulatory Visit: Payer: 59

## 2019-11-09 DIAGNOSIS — C7951 Secondary malignant neoplasm of bone: Secondary | ICD-10-CM | POA: Diagnosis not present

## 2019-11-10 ENCOUNTER — Ambulatory Visit
Admission: RE | Admit: 2019-11-10 | Discharge: 2019-11-10 | Disposition: A | Discharge status: Home / Self Care | Payer: 59 | Source: Ambulatory Visit | Attending: Radiation Oncology | Admitting: Radiation Oncology

## 2019-11-10 DIAGNOSIS — C7951 Secondary malignant neoplasm of bone: Secondary | ICD-10-CM | POA: Diagnosis not present

## 2019-11-11 ENCOUNTER — Ambulatory Visit: Payer: 59

## 2019-11-11 ENCOUNTER — Ambulatory Visit
Admission: RE | Admit: 2019-11-11 | Discharge: 2019-11-11 | Disposition: A | Discharge status: Home / Self Care | Payer: 59 | Source: Ambulatory Visit | Attending: Radiation Oncology | Admitting: Radiation Oncology

## 2019-11-11 ENCOUNTER — Other Ambulatory Visit: Payer: Self-pay | Admitting: Emergency Medicine

## 2019-11-11 DIAGNOSIS — C7951 Secondary malignant neoplasm of bone: Secondary | ICD-10-CM | POA: Diagnosis not present

## 2019-11-11 DIAGNOSIS — C3492 Malignant neoplasm of unspecified part of left bronchus or lung: Secondary | ICD-10-CM

## 2019-11-12 ENCOUNTER — Ambulatory Visit
Admission: RE | Admit: 2019-11-12 | Discharge: 2019-11-12 | Disposition: A | Discharge status: Home / Self Care | Payer: 59 | Source: Ambulatory Visit | Attending: Radiation Oncology | Admitting: Radiation Oncology

## 2019-11-12 ENCOUNTER — Encounter: Payer: Self-pay | Admitting: Oncology

## 2019-11-12 ENCOUNTER — Other Ambulatory Visit: Payer: Self-pay

## 2019-11-12 ENCOUNTER — Inpatient Hospital Stay: Payer: 59 | Attending: Oncology

## 2019-11-12 ENCOUNTER — Inpatient Hospital Stay (HOSPITAL_BASED_OUTPATIENT_CLINIC_OR_DEPARTMENT_OTHER): Payer: 59 | Admitting: Oncology

## 2019-11-12 ENCOUNTER — Ambulatory Visit: Payer: 59

## 2019-11-12 VITALS — BP 124/85 | HR 110 | Temp 97.6°F | Resp 20 | Wt 209.8 lb

## 2019-11-12 DIAGNOSIS — F17210 Nicotine dependence, cigarettes, uncomplicated: Secondary | ICD-10-CM | POA: Diagnosis not present

## 2019-11-12 DIAGNOSIS — Z79899 Other long term (current) drug therapy: Secondary | ICD-10-CM | POA: Insufficient documentation

## 2019-11-12 DIAGNOSIS — C3492 Malignant neoplasm of unspecified part of left bronchus or lung: Secondary | ICD-10-CM

## 2019-11-12 DIAGNOSIS — E119 Type 2 diabetes mellitus without complications: Secondary | ICD-10-CM | POA: Insufficient documentation

## 2019-11-12 DIAGNOSIS — C7951 Secondary malignant neoplasm of bone: Secondary | ICD-10-CM | POA: Diagnosis not present

## 2019-11-12 DIAGNOSIS — Z7984 Long term (current) use of oral hypoglycemic drugs: Secondary | ICD-10-CM | POA: Diagnosis not present

## 2019-11-12 DIAGNOSIS — Z9221 Personal history of antineoplastic chemotherapy: Secondary | ICD-10-CM | POA: Insufficient documentation

## 2019-11-12 DIAGNOSIS — C3412 Malignant neoplasm of upper lobe, left bronchus or lung: Secondary | ICD-10-CM | POA: Diagnosis not present

## 2019-11-12 DIAGNOSIS — I10 Essential (primary) hypertension: Secondary | ICD-10-CM | POA: Insufficient documentation

## 2019-11-12 DIAGNOSIS — Z923 Personal history of irradiation: Secondary | ICD-10-CM | POA: Diagnosis not present

## 2019-11-12 DIAGNOSIS — J449 Chronic obstructive pulmonary disease, unspecified: Secondary | ICD-10-CM | POA: Diagnosis not present

## 2019-11-12 LAB — CBC WITH DIFFERENTIAL/PLATELET
Abs Immature Granulocytes: 0.11 10*3/uL — ABNORMAL HIGH (ref 0.00–0.07)
Basophils Absolute: 0.1 10*3/uL (ref 0.0–0.1)
Basophils Relative: 1 %
Eosinophils Absolute: 0.2 10*3/uL (ref 0.0–0.5)
Eosinophils Relative: 2 %
HCT: 44.1 % (ref 39.0–52.0)
Hemoglobin: 15.3 g/dL (ref 13.0–17.0)
Immature Granulocytes: 1 %
Lymphocytes Relative: 8 %
Lymphs Abs: 0.6 10*3/uL — ABNORMAL LOW (ref 0.7–4.0)
MCH: 29.5 pg (ref 26.0–34.0)
MCHC: 34.7 g/dL (ref 30.0–36.0)
MCV: 85.1 fL (ref 80.0–100.0)
Monocytes Absolute: 0.7 10*3/uL (ref 0.1–1.0)
Monocytes Relative: 9 %
Neutro Abs: 6 10*3/uL (ref 1.7–7.7)
Neutrophils Relative %: 79 %
Platelets: 239 10*3/uL (ref 150–400)
RBC: 5.18 MIL/uL (ref 4.22–5.81)
RDW: 12.5 % (ref 11.5–15.5)
WBC: 7.7 10*3/uL (ref 4.0–10.5)
nRBC: 0 % (ref 0.0–0.2)

## 2019-11-12 LAB — COMPREHENSIVE METABOLIC PANEL
ALT: 23 U/L (ref 0–44)
AST: 17 U/L (ref 15–41)
Albumin: 3.9 g/dL (ref 3.5–5.0)
Alkaline Phosphatase: 96 U/L (ref 38–126)
Anion gap: 13 (ref 5–15)
BUN: 18 mg/dL (ref 6–20)
CO2: 27 mmol/L (ref 22–32)
Calcium: 8.9 mg/dL (ref 8.9–10.3)
Chloride: 89 mmol/L — ABNORMAL LOW (ref 98–111)
Creatinine, Ser: 0.99 mg/dL (ref 0.61–1.24)
GFR calc Af Amer: >60 mL/min (ref >60)
GFR calc non Af Amer: >60 mL/min (ref >60)
Glucose, Bld: 339 mg/dL — ABNORMAL HIGH (ref 70–99)
Potassium: 3.8 mmol/L (ref 3.5–5.1)
Sodium: 129 mmol/L — ABNORMAL LOW (ref 135–145)
Total Bilirubin: 0.6 mg/dL (ref 0.3–1.2)
Total Protein: 6.9 g/dL (ref 6.5–8.1)

## 2019-11-12 NOTE — Progress Notes (Signed)
Patient here today for follow up. Patient states he is having some pain in back of left shoulder. He appeared to be very out of breath and a little weak. States he has been very tired.  He denied any other concerns at this time.

## 2019-11-13 LAB — THYROID PANEL WITH TSH
Free Thyroxine Index: 2.2 (ref 1.2–4.9)
T3 Uptake Ratio: 25 % (ref 24–39)
T4, Total: 8.9 ug/dL (ref 4.5–12.0)
TSH: 1.36 u[IU]/mL (ref 0.450–4.500)

## 2019-11-16 ENCOUNTER — Ambulatory Visit
Admission: RE | Admit: 2019-11-16 | Discharge: 2019-11-16 | Disposition: A | Discharge status: Home / Self Care | Payer: 59 | Source: Ambulatory Visit | Attending: Radiation Oncology | Admitting: Radiation Oncology

## 2019-11-16 ENCOUNTER — Ambulatory Visit: Payer: 59

## 2019-11-16 DIAGNOSIS — C7951 Secondary malignant neoplasm of bone: Secondary | ICD-10-CM | POA: Insufficient documentation

## 2019-11-16 DIAGNOSIS — Z510 Encounter for antineoplastic radiation therapy: Secondary | ICD-10-CM | POA: Diagnosis not present

## 2019-11-16 DIAGNOSIS — C3492 Malignant neoplasm of unspecified part of left bronchus or lung: Secondary | ICD-10-CM | POA: Insufficient documentation

## 2019-11-17 ENCOUNTER — Ambulatory Visit
Admission: RE | Admit: 2019-11-17 | Discharge: 2019-11-17 | Disposition: A | Discharge status: Home / Self Care | Payer: 59 | Source: Ambulatory Visit | Attending: Radiation Oncology | Admitting: Radiation Oncology

## 2019-11-17 ENCOUNTER — Ambulatory Visit: Payer: 59

## 2019-11-17 DIAGNOSIS — C7951 Secondary malignant neoplasm of bone: Secondary | ICD-10-CM | POA: Diagnosis not present

## 2019-12-31 ENCOUNTER — Other Ambulatory Visit: Payer: Self-pay

## 2019-12-31 ENCOUNTER — Ambulatory Visit
Admission: RE | Admit: 2019-12-31 | Discharge: 2019-12-31 | Disposition: A | Discharge status: Home / Self Care | Payer: 59 | Source: Ambulatory Visit | Attending: Radiation Oncology | Admitting: Radiation Oncology

## 2019-12-31 ENCOUNTER — Encounter: Payer: Self-pay | Admitting: Radiation Oncology

## 2019-12-31 VITALS — BP 126/89 | HR 99 | Temp 97.6°F | Resp 20 | Wt 212.0 lb

## 2019-12-31 DIAGNOSIS — C7951 Secondary malignant neoplasm of bone: Secondary | ICD-10-CM | POA: Insufficient documentation

## 2019-12-31 DIAGNOSIS — C3492 Malignant neoplasm of unspecified part of left bronchus or lung: Secondary | ICD-10-CM | POA: Diagnosis not present

## 2019-12-31 DIAGNOSIS — F17210 Nicotine dependence, cigarettes, uncomplicated: Secondary | ICD-10-CM | POA: Insufficient documentation

## 2019-12-31 DIAGNOSIS — Z923 Personal history of irradiation: Secondary | ICD-10-CM | POA: Diagnosis not present

## 2019-12-31 NOTE — Progress Notes (Signed)
Radiation Oncology Follow up Note  Name: Gabriel Mendez   Date:   12/31/2019 MRN:  [id number] DOB: 11-06-1960    This 59 y.o. male presents to the clinic today for 1 month follow-up status post salvage radiation therapy to his right supraclavicular fossa and patient treated 3 years prior with concurrent chemoradiation therapy for stage IIIb squamous cell carcinoma.  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  PET/CT showed hypermetabolic [place] area underwent biopsy which was positive for squamous cell carcinoma.  He is seen today 1 month out and is doing well specifically [place] cough dysphagia.  Does have some slight shoulder pain really vague may be arthritic in nature.  Specifically [place] cough or hemoptysis..  Patient is a follow-up PET CT scan in August scheduled.  COMPLICATIONS OF TREATMENT: none  FOLLOW UP COMPLIANCE: keeps appointments   PHYSICAL EXAM:  BP 126/89 (BP Location: Right Arm, Patient Position: Sitting, Cuff Size: Small)   Pulse 99   Temp 97.6 F (36.4 C) (Tympanic)   Resp 20   Wt 212 lb (96.2 kg)   BMI 32.23 kg/m  Well-developed well-nourished patient in NAD. HEENT reveals PERLA, EOMI, discs not visualized.  Oral cavity is clear. No oral mucosal lesions are identified. Neck is clear without evidence of cervical or supraclavicular adenopathy. Lungs are clear to A&P. Cardiac examination is essentially unremarkable with regular rate and rhythm without murmur rub or thrill. Abdomen is benign with no organomegaly or masses noted. Motor sensory and DTR levels are equal and symmetric in the upper and lower extremities. Cranial nerves II through XII are grossly intact. Proprioception is intact. No peripheral adenopathy or edema is  identified. No motor or sensory levels are noted. Crude visual fields are within normal range.  RADIOLOGY RESULTS: PET CT scan scheduled for August.  Present time patient is doing well will evaluate his PET/CT when it becomes available have asked to see him back in 3 months for follow-up.  He continues follow-up care with medical oncology.  Patient knows to call with any concerns.  I would like to take this opportunity to thank you for allowing me to participate in the care of your patient.Marland Kitchen  PLAN: As above    [name], MD

## 2020-02-07 ENCOUNTER — Inpatient Hospital Stay: Payer: 59 | Admitting: Oncology

## 2020-05-05 ENCOUNTER — Other Ambulatory Visit: Payer: Self-pay

## 2020-05-05 ENCOUNTER — Encounter: Payer: Self-pay | Admitting: Radiation Oncology

## 2020-05-05 ENCOUNTER — Ambulatory Visit
Admission: RE | Admit: 2020-05-05 | Discharge: 2020-05-05 | Disposition: A | Discharge status: Home / Self Care | Payer: 59 | Source: Ambulatory Visit | Attending: Radiation Oncology | Admitting: Radiation Oncology

## 2020-05-05 ENCOUNTER — Other Ambulatory Visit: Payer: Self-pay | Admitting: *Deleted

## 2020-05-05 VITALS — BP 147/92 | HR 102 | Temp 97.0°F | Wt 210.0 lb

## 2020-05-05 DIAGNOSIS — C3492 Malignant neoplasm of unspecified part of left bronchus or lung: Secondary | ICD-10-CM

## 2020-05-05 NOTE — Progress Notes (Signed)
Radiation Oncology Follow up Note  Name: Gabriel Mendez   Date:   05/05/2020 MRN:  [id number] DOB: 10-28-1960    This 59 y.o. male presents to the clinic today for 68-month follow-up status post salvage radiation therapy to right supraclavicular fossa and patient treated over 3 years prior with concurrent chemoradiation for stage IIIb squamous cell carcinoma the lung.  REFERRING PROVIDER: Lynnell Jude, MD  HPI: Patient is a 59 year old male now out 5 months having completed salvage radiation therapy for disease recurrence in his right supraclavicular fossa.  He is seen today in routine follow-up is doing well is having no head and neck pain no dysphagia cough.  His weight is good p.o. intake is excellent.  They attempted to have a PET CT although this was denied by insurance has not had reimaging yet.  His shoulder pain has resolved..  COMPLICATIONS OF TREATMENT: none  FOLLOW UP COMPLIANCE: keeps appointments   PHYSICAL EXAM:  BP (!) 147/92   Pulse (!) 102   Temp (!) 97 F (36.1 C) (Tympanic)   Wt 210 lb (95.3 kg)   BMI 31.93 kg/m  Well-developed well-nourished patient in NAD. HEENT reveals PERLA, EOMI, discs not visualized.  Oral cavity is clear. No oral mucosal lesions are identified. Neck is clear without evidence of cervical or supraclavicular adenopathy. Lungs are clear to A&P. Cardiac examination is essentially unremarkable with regular rate and rhythm without murmur rub or thrill. Abdomen is benign with no organomegaly or masses noted. Motor sensory and DTR levels are equal and symmetric in the upper and lower extremities. Cranial nerves II through XII are grossly intact. Proprioception is intact. No peripheral adenopathy or edema is identified. No motor or sensory levels are noted. Crude visual fields are within normal range.  RADIOLOGY RESULTS: CT scan of the chest ordered  PLAN: Present time elect to do imaging of his chest.  I have ordered a CT scan and set up a follow-up  appointment shortly thereafter with Dr. Grayland Mendez.  I have asked to see him back in 6 months for follow-up.  Should the CT scan show any evidence of progression of disease would recommend a PET CT scan at that time and I believe that would be justified.  Patient knows to call with any concerns.  Appointment for CT scan of the chest as well as follow-up appoint with Dr. Grayland Mendez were arranged.  I would like to take this opportunity to thank you for allowing me to participate in the care of your patient.Gabriel Filbert, MD

## 2020-05-22 ENCOUNTER — Other Ambulatory Visit: Payer: Self-pay

## 2020-05-22 ENCOUNTER — Ambulatory Visit
Admission: RE | Admit: 2020-05-22 | Discharge: 2020-05-22 | Disposition: A | Discharge status: Home / Self Care | Payer: 59 | Source: Ambulatory Visit | Attending: Radiation Oncology | Admitting: Radiation Oncology

## 2020-05-22 DIAGNOSIS — C3492 Malignant neoplasm of unspecified part of left bronchus or lung: Secondary | ICD-10-CM | POA: Insufficient documentation

## 2020-05-22 LAB — POCT I-STAT CREATININE: Creatinine, Ser: 0.9 mg/dL (ref 0.61–1.24)

## 2020-05-22 MED ORDER — IOHEXOL 300 MG/ML  SOLN
75.0000 mL | Freq: Once | INTRAMUSCULAR | Status: AC | PRN
  Administered 2020-05-22: 75 mL via INTRAVENOUS

## 2020-05-30 ENCOUNTER — Inpatient Hospital Stay: Payer: 59 | Attending: Oncology | Admitting: Oncology

## 2020-05-30 ENCOUNTER — Encounter: Payer: Self-pay | Admitting: Oncology

## 2020-05-30 ENCOUNTER — Other Ambulatory Visit: Payer: Self-pay

## 2020-05-30 VITALS — BP 121/85 | HR 110 | Temp 99.3°F | Resp 18 | Wt 213.0 lb

## 2020-05-30 DIAGNOSIS — C3492 Malignant neoplasm of unspecified part of left bronchus or lung: Secondary | ICD-10-CM

## 2020-05-30 DIAGNOSIS — C3491 Malignant neoplasm of unspecified part of right bronchus or lung: Secondary | ICD-10-CM | POA: Diagnosis not present

## 2020-05-30 DIAGNOSIS — D3502 Benign neoplasm of left adrenal gland: Secondary | ICD-10-CM | POA: Insufficient documentation

## 2020-05-30 DIAGNOSIS — R531 Weakness: Secondary | ICD-10-CM | POA: Diagnosis not present

## 2020-05-30 DIAGNOSIS — I10 Essential (primary) hypertension: Secondary | ICD-10-CM | POA: Diagnosis not present

## 2020-05-30 DIAGNOSIS — Z923 Personal history of irradiation: Secondary | ICD-10-CM | POA: Insufficient documentation

## 2020-05-30 DIAGNOSIS — F17210 Nicotine dependence, cigarettes, uncomplicated: Secondary | ICD-10-CM | POA: Insufficient documentation

## 2020-05-30 DIAGNOSIS — Z9221 Personal history of antineoplastic chemotherapy: Secondary | ICD-10-CM | POA: Insufficient documentation

## 2020-05-30 DIAGNOSIS — Z79899 Other long term (current) drug therapy: Secondary | ICD-10-CM | POA: Insufficient documentation

## 2020-05-30 DIAGNOSIS — K449 Diaphragmatic hernia without obstruction or gangrene: Secondary | ICD-10-CM | POA: Insufficient documentation

## 2020-05-30 DIAGNOSIS — J449 Chronic obstructive pulmonary disease, unspecified: Secondary | ICD-10-CM | POA: Insufficient documentation

## 2020-05-30 DIAGNOSIS — I700 Atherosclerosis of aorta: Secondary | ICD-10-CM | POA: Insufficient documentation

## 2020-05-30 DIAGNOSIS — E119 Type 2 diabetes mellitus without complications: Secondary | ICD-10-CM | POA: Insufficient documentation

## 2020-05-30 DIAGNOSIS — R5383 Other fatigue: Secondary | ICD-10-CM | POA: Diagnosis not present

## 2020-05-30 DIAGNOSIS — Z7984 Long term (current) use of oral hypoglycemic drugs: Secondary | ICD-10-CM | POA: Insufficient documentation

## 2020-05-30 NOTE — Progress Notes (Signed)
[place]  [phone] [phone]  ID: Gabriel Mendez OB: [birth year]  MR#: [id number]  GNO#:[id number]  Patient Care Team: Lynnell Jude, MD as PCP - General (Family Medicine) Lloyd Huger, MD as Consulting Physician (Oncology) [name], MD as Referring Physician (Radiation Oncology) Telford Nab, RN as Registered Nurse   CHIEF COMPLAINT: Recurrent squamous cell carcinoma of the left lung, all molecular markers and PD-L1 are negative.  INTERVAL HISTORY: Patient returns to clinic today for follow-up and to discuss most recent CT chest results.  He completed maintenance Imfinzi on 08/27/2018.  He had radiation to the lung from 11/03/19-11/17/2019. He was scheduled to have a PET scan for restaging but insurance would not approve it.   In the interim, he has done well.  He was seen by Dr. Donella Stade on 05/05/2020 for follow-up.  Everything appeared stable.  He reports he is eating well.  His breathing is stable.  He denies any changes.  Denies any new bone pain.  REVIEW OF SYSTEMS:   Review of Systems  Constitutional: Positive for malaise/fatigue. Negative for chills, fever and weight loss.  HENT: Negative for congestion, ear pain and tinnitus.   Eyes: Negative.  Negative for blurred vision and double vision.  Respiratory: Positive for cough and shortness of breath (chronic). Negative for sputum production.   Cardiovascular: Negative.  Negative for chest pain, palpitations and leg swelling.  Gastrointestinal: Negative.  Negative for abdominal pain, constipation, diarrhea, nausea and vomiting.  Genitourinary: Negative for dysuria, frequency and urgency.  Musculoskeletal: Negative for back pain and falls.  Skin: Negative.  Negative for rash.  Neurological: Negative.  Negative for weakness and headaches.  Endo/Heme/Allergies: Negative.  Does not bruise/bleed easily.  Psychiatric/Behavioral: Negative.  Negative for depression. The patient is  not nervous/anxious and does not have insomnia.     As per HPI. Otherwise, a complete review of systems is negative.  PAST MEDICAL HISTORY: Past Medical History:  Diagnosis Date  . Asthma   . COPD (chronic obstructive pulmonary disease) ([place])   . Diabetes mellitus without complication ([place])   . Hypertension   . Lung cancer ([place]) 05/2017   Hx Chemo + rad tx's.  . Pneumonia     PAST SURGICAL HISTORY: Past Surgical History:  Procedure Laterality Date  . ENDOBRONCHIAL ULTRASOUND N/A 05/12/2017   Procedure: ENDOBRONCHIAL ULTRASOUND;  Surgeon: Laverle Hobby, MD;  Location: [facility] ORS;  Service: Pulmonary;  Laterality: N/A;  . FRACTURE SURGERY Left    ANKLE X 2  . TONSILLECTOMY      FAMILY HISTORY: Family History  Problem Relation Age of Onset  . Stroke Mother   . Diabetes Mother   . Hypertension Mother   . Diabetes Father   . Hypertension Father   . Diabetes Sister   . Diabetes Brother   . Heart attack Paternal Uncle   . Stroke Maternal Grandmother     ADVANCED DIRECTIVES (Y/N):  N  HEALTH MAINTENANCE: Social History   Tobacco Use  . Smoking status: Current Some Day Smoker    Packs/day: 0.50  . Smokeless tobacco: Never Used  Vaping Use  . Vaping Use: Never used  Substance Use Topics  . Alcohol use: No  . Drug use: No     Colonoscopy:  PAP:  Bone density:  Lipid panel:  Allergies  Allergen Reactions  . Shrimp [Shellfish Allergy] Nausea And Vomiting    Current Outpatient Medications  Medication Sig Dispense Refill  . albuterol (VENTOLIN HFA) 108 (90 Base)  MCG/ACT inhaler Inhale 2 puffs into the lungs every 6 (six) hours as needed for wheezing or shortness of breath. 1 Inhaler 0  . amLODipine (NORVASC) 5 MG tablet Take 5 mg by mouth daily.    Marland Kitchen atorvastatin (LIPITOR) 20 MG tablet Take 20 mg by mouth daily.    Marland Kitchen glipiZIDE (GLUCOTROL) 5 MG tablet Take 10 mg by mouth daily before breakfast.     . ibuprofen (ADVIL,MOTRIN) 200 MG tablet Take 400-800 mg  every 8 (eight) hours as needed by mouth (for pain.).    Marland Kitchen lisinopril-hydrochlorothiazide (ZESTORETIC) 20-25 MG tablet Take 1 tablet by mouth daily. (Patient taking differently: Take 2 tablets by mouth daily.) 30 tablet 0  . metFORMIN (GLUCOPHAGE) 1000 MG tablet Take 1 tablet (1,000 mg total) by mouth 2 (two) times daily. 60 tablet 0  . omeprazole (PRILOSEC) 20 MG capsule TAKE 1 CAPSULE(20 MG) BY MOUTH DAILY 30 capsule 2  . TRELEGY ELLIPTA 200-62.5-25 MCG/INH AEPB Take 1 puff by mouth daily.    Marland Kitchen albuterol (PROVENTIL) (2.5 MG/3ML) 0.083% nebulizer solution Take 2.5 mg every 6 (six) hours as needed by nebulization for wheezing or shortness of breath. (Patient not taking: Reported on 05/30/2020)    . ALPRAZolam (XANAX) 0.5 MG tablet Take 1 tablet (0.5 mg total) by mouth as needed for anxiety (Before radiation treatments). (Patient not taking: Reported on 05/30/2020) 10 tablet 0  . ANORO ELLIPTA 62.5-25 MCG/INH AEPB INHALE 1 PUFF INTO THE LUNGS DAILY (Patient not taking: Reported on 05/30/2020) 60 each 2   No current facility-administered medications for this visit.    OBJECTIVE: Vitals:   05/30/20 1038  BP: 121/85  Pulse: (!) 110  Resp: 18  Temp: 99.3 F (37.4 C)  SpO2: 100%     Body mass index is 32.39 kg/m.    ECOG FS:1 - Symptomatic but completely ambulatory  Physical Exam Constitutional:      General: Vital signs are normal.     Appearance: Normal appearance.  HENT:     Head: Normocephalic and atraumatic.  Eyes:     Pupils: Pupils are equal, round, and reactive to light.  Cardiovascular:     Rate and Rhythm: Normal rate and regular rhythm.     Heart sounds: Normal heart sounds. No murmur heard.   Pulmonary:     Effort: Pulmonary effort is normal.     Breath sounds: Normal breath sounds. No wheezing.  Abdominal:     General: Bowel sounds are normal. There is no distension.     Palpations: Abdomen is soft.     Tenderness: There is no abdominal tenderness.  Musculoskeletal:         General: No edema. Normal range of motion.     Cervical back: Normal range of motion.  Skin:    General: Skin is warm and dry.     Findings: No rash.  Neurological:     Mental Status: He is alert and oriented to person, place, and time.     Gait: Gait is intact.  Psychiatric:        Mood and Affect: Mood and affect normal.        Cognition and Memory: Memory normal.        Judgment: Judgment normal.     LAB RESULTS:  Lab Results  Component Value Date   NA 129 (L) 11/12/2019   K 3.8 11/12/2019   CL 89 (L) 11/12/2019   CO2 27 11/12/2019   GLUCOSE 339 (H) 11/12/2019   BUN 18  11/12/2019   CREATININE 0.90 05/22/2020   CALCIUM 8.9 11/12/2019   PROT 6.9 11/12/2019   ALBUMIN 3.9 11/12/2019   AST 17 11/12/2019   ALT 23 11/12/2019   ALKPHOS 96 11/12/2019   BILITOT 0.6 11/12/2019   GFRNONAA >60 11/12/2019   GFRAA >60 11/12/2019    Lab Results  Component Value Date   WBC 7.7 11/12/2019   NEUTROABS 6.0 11/12/2019   HGB 15.3 11/12/2019   HCT 44.1 11/12/2019   MCV 85.1 11/12/2019   PLT 239 11/12/2019     STUDIES: CT CHEST W CONTRAST  Result Date: 05/22/2020 CLINICAL DATA:  Lung cancer status post radiation therapy. Shortness of breath, right posterior shoulder pain. Wheezing. EXAM: CT CHEST WITH CONTRAST TECHNIQUE: Multidetector CT imaging of the chest was performed during intravenous contrast administration. CONTRAST:  4m OMNIPAQUE IOHEXOL 300 MG/ML  SOLN COMPARISON:  PET 09/27/2019 is CT chest 09/08/2019. FINDINGS: Cardiovascular: Atherosclerotic calcification of the aorta, aortic valve and coronary arteries. Heart size normal. No pericardial effusion. Mediastinum/Nodes: Subcentimeter low-attenuation lesion in the right thyroid. No follow-up recommended (ref: J Am Coll Radiol. 2015 Feb;12(2): 143-50).Low right internal jugular/right supraclavicular lymph nodes have decreased in size, now measuring up to 11 mm (2/17), previously 2.0 cm on 09/08/2019. No pathologically  enlarged mediastinal, hilar or axillary lymph nodes. Soft tissue thickening in the right hilum, unchanged. No axillary adenopathy. Esophagus is unremarkable. Lungs/Pleura: Centrilobular and paraseptal emphysema. Mild smoking related respiratory bronchiolitis. New ground-glass and subpleural consolidation in the juxta mediastinal apical right upper lobe are indicative of radiation therapy. Right perihilar parenchymal retraction and bronchiectasis, also treatment related and stable. There is a new 11 mm nodule in the right lower lobe (3/82). Two new nodules are seen in the apical left upper lobe, measuring up to 11 mm (3/30 to, 27). Minimal scarring in the inferior lingula. No pleural fluid. Narrowing of the distal bronchus intermedius, similar. Airway is otherwise unremarkable. Upper Abdomen: Visualized portions of the liver and gallbladder are unremarkable. Nodular thickening of the adrenal glands bilaterally. Discrete 2.1 cm left adrenal nodule, shown to be fluid in density on 09/27/2019. Renal cortical scarring bilaterally. Visualized portions of the kidneys, spleen, pancreas, stomach and bowel are otherwise unremarkable with the exception of a small hiatal hernia. No upper abdominal adenopathy. Musculoskeletal: Degenerative changes in the spine. No worrisome lytic or sclerotic lesions. Old rib fractures. Suspect an old manubrial fracture. IMPRESSION: 1. New bilateral pulmonary nodules, indicative of metastatic disease. 2. Interval improvement in low right internal jugular/right supraclavicular adenopathy. 3. Post treatment changes in the right hemithorax. 4. Left adrenal adenoma. 5. Aortic atherosclerosis (ICD10-I70.0). Coronary artery calcification. 6.  Emphysema (ICD10-J43.9). Electronically Signed   By: MLorin PicketM.D.   On: 05/22/2020 12:49    ASSESSMENT: Recurrent squamous cell carcinoma of the left lung, all molecular markers and PD-L1 are negative.  PLAN:    1.  Recurrent squamous cell  carcinoma of the left lung, all molecular markers and PD-L1 are negative:  -Initial treatment with carbo/Taxol along with daily XRT. -Completed maintenance nivolumab on 08/27/2018. -Completed salvage radiation therapy to right supraclavicular fossa which she completed on 11/17/2019. -Plan was for repeat PET scan but unfortunately insurance did not approve. -He was seen by Dr. CDonella Stadeon 05/05/2020 for follow-up who ordered a CT chest for follow-up given PET scan was denied. -CT chest with contrast from 05/22/2020 showed new bilateral pulmonary nodules indicative of metastatic disease.  Interval improvement in low right intrajugular right supraclavicular adenopathy. -Recommend PET scan in the  next 1 to 2 weeks and follow-up with Dr. Grayland Ormond shortly after.  Disposition: -PET scan ordered and to be scheduled in the next 1 to 2 weeks. -Follow-up with Dr. Grayland Ormond to review results.  Patient expressed understanding and was in agreement with this plan. He also understands that He can call clinic at any time with any questions, concerns, or complaints.   Cancer Staging Squamous cell lung cancer, left [facility]) Staging form: Lung, AJCC 8th Edition - Clinical stage from 05/16/2017: Stage IIIB (cT3, cN2, cM0) - Signed by Lloyd Huger, MD on 05/16/2017   Jacquelin Hawking, NP   05/30/2020 11:08 AM

## 2020-06-29 ENCOUNTER — Ambulatory Visit: Payer: 59

## 2020-07-04 ENCOUNTER — Ambulatory Visit: Payer: 59 | Admitting: Oncology

## 2020-07-26 ENCOUNTER — Ambulatory Visit
Admission: RE | Admit: 2020-07-26 | Discharge: 2020-07-26 | Disposition: A | Discharge status: Home / Self Care | Payer: 59 | Source: Ambulatory Visit | Attending: Oncology | Admitting: Oncology

## 2020-07-26 ENCOUNTER — Telehealth: Payer: Self-pay | Admitting: Oncology

## 2020-07-26 ENCOUNTER — Other Ambulatory Visit: Payer: Self-pay

## 2020-07-26 DIAGNOSIS — C3492 Malignant neoplasm of unspecified part of left bronchus or lung: Secondary | ICD-10-CM | POA: Diagnosis present

## 2020-07-26 LAB — GLUCOSE, CAPILLARY
Glucose-Capillary: 443 mg/dL — ABNORMAL HIGH (ref 70–99)
Glucose-Capillary: 406 mg/dL — ABNORMAL HIGH (ref 70–99)

## 2020-07-26 MED ORDER — FLUDEOXYGLUCOSE F - 18 (FDG) INJECTION: 11.0000 | Freq: Once | INTRAVENOUS | Status: DC | PRN

## 2020-07-26 NOTE — Telephone Encounter (Signed)
Pt called to report that he was unable to get his PET scan because his blood sugar was too high. Needs to have PET and f/u MD appt r/s.

## 2020-07-26 NOTE — Telephone Encounter (Signed)
Called pt to notify him of PET scan scheduled for 08/17/20 and MD F/U on 08/23/20. Pt confirmed. Sending AVS via mail.

## 2020-07-31 ENCOUNTER — Ambulatory Visit: Payer: 59 | Admitting: Oncology

## 2020-08-17 ENCOUNTER — Ambulatory Visit: Payer: 59

## 2020-08-19 NOTE — Progress Notes (Signed)
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  INTERVAL HISTORY: Patient returns to clinic today for routine 80-monthevaluation.  He has now completed all of his XRT.  He currently feels well and is asymptomatic.  He does not complain of any further weakness and fatigue.  He continues to remain active and work full-time  He has no neurologic complaints. He has a good appetite and denies weight loss.  He denies any pain.  He denies any chest pain, cough, hemoptysis, or shortness of breath.  He has no nausea, vomiting, constipation, or diarrhea.  He has no urinary complaints.  Patient offers no specific complaints today.  REVIEW OF SYSTEMS:   Review of Systems  Constitutional: Negative.  Negative for fever, malaise/fatigue and weight loss.  HENT: Negative.  Negative for congestion.   Respiratory: Negative.  Negative for cough, hemoptysis and shortness of breath.   Cardiovascular: Negative.  Negative for chest pain and leg swelling.  Gastrointestinal: Negative.  Negative for abdominal pain and heartburn.  Genitourinary: Negative.  Negative for dysuria.  Musculoskeletal: Negative.  Negative for joint pain.  Skin: Negative.  Negative for rash.  Neurological: Negative.  Negative for dizziness, sensory change, focal weakness, weakness and headaches.  Psychiatric/Behavioral: Negative.  The patient is not nervous/anxious.     As per HPI. Otherwise, a complete review of systems is negative.  PAST MEDICAL HISTORY: Past Medical History:  Diagnosis Date   . Asthma   . COPD (chronic obstructive pulmonary disease) ([facility]   . Diabetes mellitus without complication ([place]   . Hypertension   . Lung [place] ([place]   Hx Chemo + rad tx's.  . Pneumonia     PAST SURGICAL HISTORY: Past Surgical History:  Procedure Laterality Date  . ENDOBRONCHIAL ULTRASOUND N/A 05/12/2017   Procedure: ENDOBRONCHIAL ULTRASOUND;  Surgeon: RLaverle Hobby MD;  Location: [facility] ORS;  Service: Pulmonary;  Laterality: N/A;  . FRACTURE SURGERY Left    ANKLE X 2  . TONSILLECTOMY      FAMILY HISTORY: Family History  Problem Relation Age of Onset  . Stroke Mother   . Diabetes Mother   . Hypertension Mother   . Diabetes Father   . Hypertension Father   . Diabetes Sister   . Diabetes Brother   . Heart attack Paternal Uncle   . Stroke Maternal Grandmother     ADVANCED DIRECTIVES (Y/N):  N  HEALTH MAINTENANCE: Social History   Tobacco Use  . Smoking status: Current Some Day Smoker    Packs/day: 0.50  . Smokeless tobacco: Never Used  Vaping Use  . Vaping Use: Never used  Substance Use Topics  . Alcohol use: No  . Drug use: No     Colonoscopy:  PAP:  Bone density:  Lipid panel:  Allergies  Allergen Reactions  . Shrimp [Shellfish Allergy] Nausea And Vomiting    Current Outpatient Medications  Medication Sig Dispense Refill  . albuterol (VENTOLIN HFA) 108 (90 Base) MCG/ACT inhaler Inhale 2 puffs into the lungs every 6 (six) hours as needed for wheezing or shortness of breath. 1 Inhaler 0  . amLODipine (  NORVASC) 5 MG tablet Take 5 mg by mouth daily.    Marland Kitchen atorvastatin (LIPITOR) 20 MG tablet Take 20 mg by mouth daily.    Marland Kitchen glipiZIDE (GLUCOTROL) 5 MG tablet Take 10 mg by mouth daily before breakfast.     . ibuprofen (ADVIL,MOTRIN) 200 MG tablet Take 400-800 mg every 8 (eight) hours as needed by mouth (for pain.).    Marland Kitchen lisinopril-hydrochlorothiazide (ZESTORETIC) 20-25 MG tablet Take 1 tablet by mouth daily. (Patient taking differently: Take  2 tablets by mouth daily.) 30 tablet 0  . metFORMIN (GLUCOPHAGE) 1000 MG tablet Take 1 tablet (1,000 mg total) by mouth 2 (two) times daily. 60 tablet 0  . omeprazole (PRILOSEC) 20 MG capsule TAKE 1 CAPSULE(20 MG) BY MOUTH DAILY 30 capsule 2  . TRELEGY ELLIPTA 200-62.5-25 MCG/INH AEPB Take 1 puff by mouth daily.    Marland Kitchen albuterol (PROVENTIL) (2.5 MG/3ML) 0.083% nebulizer solution Take 2.5 mg every 6 (six) hours as needed by nebulization for wheezing or shortness of breath. (Patient not taking: No sig reported)    . ALPRAZolam (XANAX) 0.5 MG tablet Take 1 tablet (0.5 mg total) by mouth as needed for anxiety (Before radiation treatments). (Patient not taking: No sig reported) 10 tablet 0  . ANORO ELLIPTA 62.5-25 MCG/INH AEPB INHALE 1 PUFF INTO THE LUNGS DAILY (Patient not taking: No sig reported) 60 each 2   No current facility-administered medications for this visit.    OBJECTIVE: Vitals:   08/23/20 1005  BP: 115/75  Pulse: 86  Resp: 18  Temp: (!) 96.7 F (35.9 C)  SpO2: 99%     Body mass index is 30.38 kg/m.    ECOG FS:0 - Asymptomatic  General: Well-developed, well-nourished, no acute distress. Eyes: Pink conjunctiva, anicteric sclera. HEENT: Normocephalic, moist mucous membranes. Lungs: No audible wheezing or coughing. Heart: Regular rate and rhythm. Abdomen: Soft, nontender, no obvious distention. Musculoskeletal: No edema, cyanosis, or clubbing. Neuro: Alert, answering all questions appropriately. Cranial nerves grossly intact. Skin: No rashes or petechiae noted. Psych: Normal affect.  LAB RESULTS:  Lab Results  Component Value Date   NA 129 (L) 11/12/2019   K 3.8 11/12/2019   CL 89 (L) 11/12/2019   CO2 27 11/12/2019   GLUCOSE 339 (H) 11/12/2019   BUN 18 11/12/2019   CREATININE 0.90 05/22/2020   CALCIUM 8.9 11/12/2019   PROT 6.9 11/12/2019   ALBUMIN 3.9 11/12/2019   AST 17 11/12/2019   ALT 23 11/12/2019   ALKPHOS 96 11/12/2019   BILITOT 0.6 11/12/2019   GFRNONAA  >60 11/12/2019   GFRAA >60 11/12/2019    Lab Results  Component Value Date   WBC 7.7 11/12/2019   NEUTROABS 6.0 11/12/2019   HGB 15.3 11/12/2019   HCT 44.1 11/12/2019   MCV 85.1 11/12/2019   PLT 239 11/12/2019     STUDIES: No results found.  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  PLAN:    1.  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  He subsequently completed 1 year of maintenance durvalumab on August 27, 2018. CT scan on September 08, 2019 revealed progressive lymphadenopathy in his right supraclavicular and right thoracic inlet.  PET scan completed on September 27, 2019 confirmed likely recurrence.  Biopsy on October 04, 2019 consistent with squamous cell carcinoma. He completed XRT only for his local  recurrence on November 17, 2019.  His most recent CT scan on May 22, 2020 reviewed independently with new bilateral pulmonary nodules concerning for metastatic disease.  Confirmatory PET scan was attempted, but this ultimately did not happen secondary to increased blood glucose levels and then denial from insurance.  Because it has been 3 months since his last imaging, will get get CT of the chest to assess for interval change.  Patient will have video assisted telemedicine visit 1 to 2 days after his CT scan to discuss the results. 2.  Hypertension: Significantly improved.  Patient's blood pressure is within normal limits today. 3.  Shortness of breath/cough: Resolved. 4.  Hyperglycemia: Continue evaluation and treatment per primary care.  I spent a total of 30 minutes reviewing chart data, face-to-face evaluation with the patient, counseling and coordination of care as detailed above.    Patient expressed understanding and was in agreement with this plan. He also understands  that He can call clinic at any time with any questions, concerns, or complaints.   Cancer Staging Squamous cell lung cancer, left [facility]) Staging form: Lung, AJCC 8th Edition - Clinical stage from 05/16/2017: Stage IIIB (cT3, cN2, cM0) - Signed by Lloyd Huger, MD on 05/16/2017   Lloyd Huger, MD   08/23/2020 10:46 AM

## 2020-08-23 ENCOUNTER — Inpatient Hospital Stay: Payer: 59 | Attending: Oncology | Admitting: Oncology

## 2020-08-23 ENCOUNTER — Telehealth: Payer: Self-pay | Admitting: Oncology

## 2020-08-23 VITALS — BP 115/75 | HR 86 | Temp 96.7°F | Resp 18 | Wt 199.8 lb

## 2020-08-23 DIAGNOSIS — E119 Type 2 diabetes mellitus without complications: Secondary | ICD-10-CM | POA: Insufficient documentation

## 2020-08-23 DIAGNOSIS — J449 Chronic obstructive pulmonary disease, unspecified: Secondary | ICD-10-CM | POA: Insufficient documentation

## 2020-08-23 DIAGNOSIS — I10 Essential (primary) hypertension: Secondary | ICD-10-CM | POA: Diagnosis not present

## 2020-08-23 DIAGNOSIS — Z79899 Other long term (current) drug therapy: Secondary | ICD-10-CM | POA: Diagnosis not present

## 2020-08-23 DIAGNOSIS — R739 Hyperglycemia, unspecified: Secondary | ICD-10-CM | POA: Insufficient documentation

## 2020-08-23 DIAGNOSIS — C3492 Malignant neoplasm of unspecified part of left bronchus or lung: Secondary | ICD-10-CM | POA: Insufficient documentation

## 2020-08-23 DIAGNOSIS — F17210 Nicotine dependence, cigarettes, uncomplicated: Secondary | ICD-10-CM | POA: Diagnosis not present

## 2020-08-23 DIAGNOSIS — R0602 Shortness of breath: Secondary | ICD-10-CM | POA: Insufficient documentation

## 2020-08-23 NOTE — Telephone Encounter (Signed)
Spoke with patient to notify him of CT scan scheduled on 3/14. Reviewed instructions. Patient agreeable to day and time.

## 2020-08-28 ENCOUNTER — Ambulatory Visit
Admission: RE | Admit: 2020-08-28 | Discharge: 2020-08-28 | Disposition: A | Discharge status: Home / Self Care | Payer: 59 | Source: Ambulatory Visit | Attending: Oncology | Admitting: Oncology

## 2020-08-28 ENCOUNTER — Other Ambulatory Visit: Payer: Self-pay

## 2020-08-28 DIAGNOSIS — C3492 Malignant neoplasm of unspecified part of left bronchus or lung: Secondary | ICD-10-CM | POA: Diagnosis present

## 2020-08-28 LAB — POCT I-STAT CREATININE: Creatinine, Ser: 0.8 mg/dL (ref 0.61–1.24)

## 2020-08-28 MED ORDER — IOHEXOL 300 MG/ML  SOLN
75.0000 mL | Freq: Once | INTRAMUSCULAR | Status: AC | PRN
  Administered 2020-08-28: 75 mL via INTRAVENOUS

## 2020-09-01 NOTE — Progress Notes (Signed)
[facility] M[facility]  Telephone:(336) [phone] Fax:(336) [phone]  ID: CHARLOTTE BRAFFORD OB: 03-06-1961  MR#: [id number]  TUU#:[id number]  Patient Care Team: Lynnell Jude, MD as PCP - General (Family Medicine) Lloyd Huger, MD as Consulting Physician (Oncology) [name], MD as Referring Physician (Radiation Oncology) Telford Nab, RN as Registered Nurse  I connected with [facility] on 09/05/20 at  2:00 PM EDT by video enabled telemedicine visit and verified that I am speaking with the correct person using two identifiers.   I discussed the limitations, risks, security and privacy concerns of performing an evaluation and management service by telemedicine and the availability of in-person appointments. I also discussed with the patient that there may be a patient responsible charge related to this service. The patient expressed understanding and agreed to proceed.   Other persons participating in the visit and their role in the encounter: Patient, MD.  Patient's location: Home. Provider's location: Clinic.  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  INTERVAL HISTORY: Patient agreed to video assisted telemedicine visit for further evaluation and discussion of his imaging results.  He currently feels well and is asymptomatic.  He continues to be active and work full-time. He has no neurologic complaints. He has a good appetite and denies weight loss.  He denies any pain.  He denies any chest pain, cough, hemoptysis, or shortness of breath.  He has no nausea, vomiting, constipation, or diarrhea.  He has no urinary complaints.  Patient feels at his baseline offers no specific complaints today.  REVIEW OF SYSTEMS:   Review of Systems  Constitutional: Negative.  Negative for fever, malaise/fatigue and weight loss.  HENT: Negative.  Negative for congestion.   Respiratory: Negative.  Negative for  cough, hemoptysis and shortness of breath.   Cardiovascular: Negative.  Negative for chest pain and leg swelling.  Gastrointestinal: Negative.  Negative for abdominal pain and heartburn.  Genitourinary: Negative.  Negative for dysuria.  Musculoskeletal: Negative.  Negative for joint pain.  Skin: Negative.  Negative for rash.  Neurological: Negative.  Negative for dizziness, sensory change, focal weakness, weakness and headaches.  Psychiatric/Behavioral: Negative.  The patient is not nervous/anxious.     As per HPI. Otherwise, a complete review of systems is negative.  PAST MEDICAL HISTORY: Past Medical History:  Diagnosis Date  . Asthma   . COPD (chronic obstructive pulmonary disease) ([place])   . Diabetes mellitus without complication ([place])   . Hypertension   . Lung cancer ([place]) 05/2017   Hx Chemo + rad tx's.  . Pneumonia     PAST SURGICAL HISTORY: Past Surgical History:  Procedure Laterality Date  . ENDOBRONCHIAL ULTRASOUND N/A 05/12/2017   Procedure: ENDOBRONCHIAL ULTRASOUND;  Surgeon: Laverle Hobby, MD;  Location: [facility] ORS;  Service: Pulmonary;  Laterality: N/A;  . FRACTURE SURGERY Left    ANKLE X 2  . TONSILLECTOMY      FAMILY HISTORY: Family History  Problem Relation Age of Onset  . Stroke Mother   . Diabetes Mother   . Hypertension Mother   . Diabetes Father   . Hypertension Father   . Diabetes Sister   . Diabetes Brother   . Heart attack Paternal Uncle   . Stroke Maternal Grandmother     ADVANCED DIRECTIVES (Y/N):  N  HEALTH MAINTENANCE: Social History   Tobacco Use  . Smoking status: Current Some Day Smoker    Packs/day: 0.50  . Smokeless tobacco:  Never Used  Vaping Use  . Vaping Use: Never used  Substance Use Topics  . Alcohol use: No  . Drug use: No     Colonoscopy:  PAP:  Bone density:  Lipid panel:  Allergies  Allergen Reactions  . Shrimp [Shellfish Allergy] Nausea And Vomiting    Current Outpatient Medications  Medication  Sig Dispense Refill  . albuterol (VENTOLIN HFA) 108 (90 Base) MCG/ACT inhaler Inhale 2 puffs into the lungs every 6 (six) hours as needed for wheezing or shortness of breath. 1 Inhaler 0  . amLODipine (NORVASC) 5 MG tablet Take 5 mg by mouth daily.    Marland Kitchen atorvastatin (LIPITOR) 20 MG tablet Take 20 mg by mouth daily.    Marland Kitchen glipiZIDE (GLUCOTROL) 5 MG tablet Take 10 mg by mouth daily before breakfast.     . ibuprofen (ADVIL,MOTRIN) 200 MG tablet Take 400-800 mg every 8 (eight) hours as needed by mouth (for pain.).    Marland Kitchen lisinopril-hydrochlorothiazide (ZESTORETIC) 20-25 MG tablet Take 1 tablet by mouth daily. (Patient taking differently: Take 2 tablets by mouth daily.) 30 tablet 0  . metFORMIN (GLUCOPHAGE) 1000 MG tablet Take 1 tablet (1,000 mg total) by mouth 2 (two) times daily. 60 tablet 0  . omeprazole (PRILOSEC) 20 MG capsule TAKE 1 CAPSULE(20 MG) BY MOUTH DAILY 30 capsule 2  . TRELEGY ELLIPTA 200-62.5-25 MCG/INH AEPB Take 1 puff by mouth daily.    Marland Kitchen albuterol (PROVENTIL) (2.5 MG/3ML) 0.083% nebulizer solution Take 2.5 mg every 6 (six) hours as needed by nebulization for wheezing or shortness of breath. (Patient not taking: No sig reported)    . ALPRAZolam (XANAX) 0.5 MG tablet Take 1 tablet (0.5 mg total) by mouth as needed for anxiety (Before radiation treatments). (Patient not taking: No sig reported) 10 tablet 0  . ANORO ELLIPTA 62.5-25 MCG/INH AEPB INHALE 1 PUFF INTO THE LUNGS DAILY (Patient not taking: Reported on 09/05/2020) 60 each 2   No current facility-administered medications for this visit.    OBJECTIVE: There were no vitals filed for this visit.   There is no height or weight on file to calculate BMI.    ECOG FS:0 - Asymptomatic  General: Well-developed, well-nourished, no acute distress. HEENT: Normocephalic. Neuro: Alert, answering all questions appropriately. Cranial nerves grossly intact. Psych: Normal affect.   LAB RESULTS:  Lab Results  Component Value Date   NA 129 (L)  11/12/2019   K 3.8 11/12/2019   CL 89 (L) 11/12/2019   CO2 27 11/12/2019   GLUCOSE 339 (H) 11/12/2019   BUN 18 11/12/2019   CREATININE 0.80 08/28/2020   CALCIUM 8.9 11/12/2019   PROT 6.9 11/12/2019   ALBUMIN 3.9 11/12/2019   AST 17 11/12/2019   ALT 23 11/12/2019   ALKPHOS 96 11/12/2019   BILITOT 0.6 11/12/2019   GFRNONAA >60 11/12/2019   GFRAA >60 11/12/2019    Lab Results  Component Value Date   WBC 7.7 11/12/2019   NEUTROABS 6.0 11/12/2019   HGB 15.3 11/12/2019   HCT 44.1 11/12/2019   MCV 85.1 11/12/2019   PLT 239 11/12/2019     STUDIES: CT Chest W Contrast  Result Date: 08/28/2020 CLINICAL DATA:  Non-small-cell lung cancer.  Restaging. EXAM: CT CHEST WITH CONTRAST TECHNIQUE: Multidetector CT imaging of the chest was performed during intravenous contrast administration. CONTRAST:  76m OMNIPAQUE IOHEXOL 300 MG/ML  SOLN COMPARISON:  Chest CT 05/22/2020. FINDINGS: Cardiovascular: The heart size is normal. No substantial pericardial effusion. Coronary artery calcification is evident. Atherosclerotic calcification is  noted in the wall of the thoracic aorta. Mediastinum/Nodes: Scattered small mediastinal lymph nodes evident. No left hilar lymphadenopathy. Post treatment changes noted right hilum. The esophagus has normal imaging features. There is no axillary lymphadenopathy. Stranding noted in the right supraclavicular region, near the location of index lymph node seen on the prior study. The index right supraclavicular node identified previously is stable measuring 7 mm short axis on image 15/2 today. Lungs/Pleura: Index right upper lobe nodule measured previously at 11 mm is 17 mm in cavitary on image 30/3 today. A second left apical nodule measuring 12 mm on image 25/3 today was 7 mm previously (remeasured). Lower right pulmonary nodule measured previously at 11 mm is 20 mm on image 86/3 today. Volume loss in the right hemithorax is stable with traction bronchiectasis and parenchymal  scarring in the parahilar right lung, consistent with sequelae of radiation therapy. No new pulmonary nodule or mass on today's study. No pleural effusion. Upper Abdomen: 2.0 cm left adrenal nodule is 2.1 cm previously. 14 mm nodule in the right adrenal gland was 12 mm previously (remeasured). Musculoskeletal: No worrisome lytic or sclerotic osseous abnormality. IMPRESSION: 1. Interval progression of bilateral pulmonary nodules, consistent with metastatic disease. 2. Stable right supraclavicular lymph nodes. 3. Stable appearance of post treatment changes in the right hemithorax. 4. 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 5. Aortic Atherosclerosis (ICD10-I70.0). Electronically Signed   By: Misty Stanley M.D.   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  PLAN:    1.  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  He subsequently completed 1 year of maintenance durvalumab on August 27, 2018. CT scan on September 08, 2019 revealed progressive lymphadenopathy in his right supraclavicular and right thoracic inlet.  PET scan completed on September 27, 2019 confirmed likely recurrence.  Biopsy on October 04, 2019 consistent with squamous cell carcinoma. He completed XRT only for his local recurrence on November 17, 2019.  Repeat CT scan on August 28, 2020 reviewed independently and reported as above with continued progression of 3 pulmonary nodules in his left and right upper lobe as well as right lower lobe.  Will get a repeat PET scan pending insurance approval to confirm recurrence.  Patient may now require systemic treatment.  He will have a video visit 1 to 2 days after PET scan to discuss the results.  2.  Hypertension: Significantly improved.  Patient's blood pressure is within  normal limits today. 3.  Shortness of breath/cough: Resolved. 4.  Hyperglycemia: Continue evaluation and treatment per primary care.   I provided 20 minutes of face-to-face video visit time during this encounter which included chart review, counseling, and coordination of care as documented above.   Patient expressed understanding and was in agreement with this plan. He also understands that He can call clinic at any time with any questions, concerns, or complaints.   Cancer Staging Squamous cell lung cancer, left [facility]) Staging form: Lung, AJCC 8th Edition - Clinical stage from 05/16/2017: Stage IIIB (cT3, cN2, cM0) - Signed by Lloyd Huger, MD on 05/16/2017   Lloyd Huger, MD   09/05/2020 2:56 PM

## 2020-09-05 ENCOUNTER — Encounter: Payer: Self-pay | Admitting: Oncology

## 2020-09-05 ENCOUNTER — Inpatient Hospital Stay (HOSPITAL_BASED_OUTPATIENT_CLINIC_OR_DEPARTMENT_OTHER): Payer: 59 | Admitting: Oncology

## 2020-09-05 DIAGNOSIS — C3492 Malignant neoplasm of unspecified part of left bronchus or lung: Secondary | ICD-10-CM | POA: Diagnosis not present

## 2020-09-05 NOTE — Progress Notes (Signed)
Patient scheduled for virtual visit today to review CT results. Patient denies any concerns today.

## 2020-09-14 ENCOUNTER — Ambulatory Visit: Payer: 59

## 2020-09-14 NOTE — Progress Notes (Signed)
[place]  Telephone:(336) [phone] [phone]) [phone]  ID: ALISON BREEDING OB: 07-Dec-1960  MR#: [id number]  EHU#:[id number]  Patient Care Team: Lynnell Jude, MD as PCP - General (Family Medicine) Lloyd Huger, MD as Consulting Physician (Oncology) [name], MD as Referring Physician (Radiation Oncology) Telford Nab, RN as Registered Nurse  I connected with Baird Cancer Akhtar on 09/14/20 at  2:15 PM EDT by video enabled telemedicine visit and verified that I am speaking with the correct person using two identifiers.   I discussed the limitations, risks, security and privacy concerns of performing an evaluation and management service by telemedicine and the availability of in-person appointments. I also discussed with the patient that there may be a patient responsible charge related to this service. The patient expressed understanding and agreed to proceed.   Other persons participating in the visit and their role in the encounter: Patient, MD.  Patient's location: Home. Provider's location: Clinic.   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  INTERVAL HISTORY: Patient agreed to video assisted telemedicine visit for further evaluation and discussion of his PET scan results.  He currently feels well and is asymptomatic.  He continues to be active and work full-time. He has no neurologic complaints. He has a good appetite and denies weight loss.  He denies any pain.  He denies any chest pain, cough, hemoptysis, or shortness of breath.  He has no nausea, vomiting, constipation, or diarrhea.  He has no urinary complaints.  Patient offers no specific complaints today.  REVIEW OF SYSTEMS:   Review of Systems  Constitutional: Negative.  Negative for fever, malaise/fatigue and weight loss.  HENT: Negative.  Negative for congestion.   Respiratory: Negative.  Negative for cough, hemoptysis and  shortness of breath.   Cardiovascular: Negative.  Negative for chest pain and leg swelling.  Gastrointestinal: Negative.  Negative for abdominal pain and heartburn.  Genitourinary: Negative.  Negative for dysuria.  Musculoskeletal: Negative.  Negative for joint pain.  Skin: Negative.  Negative for rash.  Neurological: Negative.  Negative for dizziness, sensory change, focal weakness, weakness and headaches.  Psychiatric/Behavioral: Negative.  The patient is not nervous/anxious.     As per HPI. Otherwise, a complete review of systems is negative.  PAST MEDICAL HISTORY: Past Medical History:  Diagnosis Date  . Asthma   . COPD (chronic obstructive pulmonary disease) ([place])   . Diabetes mellitus without complication ([place])   . Hypertension   . Lung cancer ([place]) 05/2017   Hx Chemo + rad tx's.  . Pneumonia     PAST SURGICAL HISTORY: Past Surgical History:  Procedure Laterality Date  . ENDOBRONCHIAL ULTRASOUND N/A 05/12/2017   Procedure: ENDOBRONCHIAL ULTRASOUND;  Surgeon: Laverle Hobby, MD;  Location: [facility] ORS;  Service: Pulmonary;  Laterality: N/A;  . FRACTURE SURGERY Left    ANKLE X 2  . TONSILLECTOMY      FAMILY HISTORY: Family History  Problem Relation Age of Onset  . Stroke Mother   . Diabetes Mother   . Hypertension Mother   . Diabetes Father   . Hypertension Father   . Diabetes Sister   . Diabetes Brother   . Heart attack Paternal Uncle   . Stroke Maternal Grandmother     ADVANCED DIRECTIVES (Y/N):  N  HEALTH MAINTENANCE: Social History   Tobacco Use  . Smoking status: Current Some Day Smoker    Packs/day: 0.50  . Smokeless tobacco: Never Used  Vaping Use  . Vaping Use: Never used  Substance Use Topics  . Alcohol use: No  . Drug use: No     Colonoscopy:  PAP:  Bone density:  Lipid panel:  Allergies  Allergen Reactions  . Shrimp [Shellfish Allergy] Nausea And Vomiting    Current Outpatient Medications  Medication Sig Dispense Refill  .  albuterol (PROVENTIL) (2.5 MG/3ML) 0.083% nebulizer solution Take 2.5 mg every 6 (six) hours as needed by nebulization for wheezing or shortness of breath. (Patient not taking: No sig reported)    . albuterol (VENTOLIN HFA) 108 (90 Base) MCG/ACT inhaler Inhale 2 puffs into the lungs every 6 (six) hours as needed for wheezing or shortness of breath. 1 Inhaler 0  . ALPRAZolam (XANAX) 0.5 MG tablet Take 1 tablet (0.5 mg total) by mouth as needed for anxiety (Before radiation treatments). (Patient not taking: No sig reported) 10 tablet 0  . amLODipine (NORVASC) 5 MG tablet Take 5 mg by mouth daily.    Jearl Klinefelter ELLIPTA 62.5-25 MCG/INH AEPB INHALE 1 PUFF INTO THE LUNGS DAILY (Patient not taking: Reported on 09/05/2020) 60 each 2  . atorvastatin (LIPITOR) 20 MG tablet Take 20 mg by mouth daily.    Marland Kitchen glipiZIDE (GLUCOTROL) 5 MG tablet Take 10 mg by mouth daily before breakfast.     . ibuprofen (ADVIL,MOTRIN) 200 MG tablet Take 400-800 mg every 8 (eight) hours as needed by mouth (for pain.).    Marland Kitchen lisinopril-hydrochlorothiazide (ZESTORETIC) 20-25 MG tablet Take 1 tablet by mouth daily. (Patient taking differently: Take 2 tablets by mouth daily.) 30 tablet 0  . metFORMIN (GLUCOPHAGE) 1000 MG tablet Take 1 tablet (1,000 mg total) by mouth 2 (two) times daily. 60 tablet 0  . omeprazole (PRILOSEC) 20 MG capsule TAKE 1 CAPSULE(20 MG) BY MOUTH DAILY 30 capsule 2  . TRELEGY ELLIPTA 200-62.5-25 MCG/INH AEPB Take 1 puff by mouth daily.     No current facility-administered medications for this visit.    OBJECTIVE: There were no vitals filed for this visit.   There is no height or weight on file to calculate BMI.    ECOG FS:0 - Asymptomatic  General: Well-developed, well-nourished, no acute distress. HEENT: Normocephalic. Neuro: Alert, answering all questions appropriately. Cranial nerves grossly intact. Psych: Normal affect.   LAB RESULTS:  Lab Results  Component Value Date   NA 129 (L) 11/12/2019   K 3.8  11/12/2019   CL 89 (L) 11/12/2019   CO2 27 11/12/2019   GLUCOSE 339 (H) 11/12/2019   BUN 18 11/12/2019   CREATININE 0.80 08/28/2020   CALCIUM 8.9 11/12/2019   PROT 6.9 11/12/2019   ALBUMIN 3.9 11/12/2019   AST 17 11/12/2019   ALT 23 11/12/2019   ALKPHOS 96 11/12/2019   BILITOT 0.6 11/12/2019   GFRNONAA >60 11/12/2019   GFRAA >60 11/12/2019    Lab Results  Component Value Date   WBC 7.7 11/12/2019   NEUTROABS 6.0 11/12/2019   HGB 15.3 11/12/2019   HCT 44.1 11/12/2019   MCV 85.1 11/12/2019   PLT 239 11/12/2019     STUDIES: CT Chest W Contrast  Result Date: 08/28/2020 CLINICAL DATA:  Non-small-cell lung cancer.  Restaging. EXAM: CT CHEST WITH CONTRAST TECHNIQUE: Multidetector CT imaging of the chest was performed during intravenous contrast administration. CONTRAST:  45m OMNIPAQUE IOHEXOL 300 MG/ML  SOLN COMPARISON:  Chest CT 05/22/2020. FINDINGS: Cardiovascular: The heart size is normal. No substantial pericardial effusion. Coronary artery calcification is evident. Atherosclerotic calcification is noted in the  wall of the thoracic aorta. Mediastinum/Nodes: Scattered small mediastinal lymph nodes evident. No left hilar lymphadenopathy. Post treatment changes noted right hilum. The esophagus has normal imaging features. There is no axillary lymphadenopathy. Stranding noted in the right supraclavicular region, near the location of index lymph node seen on the prior study. The index right supraclavicular node identified previously is stable measuring 7 mm short axis on image 15/2 today. Lungs/Pleura: Index right upper lobe nodule measured previously at 11 mm is 17 mm in cavitary on image 30/3 today. A second left apical nodule measuring 12 mm on image 25/3 today was 7 mm previously (remeasured). Lower right pulmonary nodule measured previously at 11 mm is 20 mm on image 86/3 today. Volume loss in the right hemithorax is stable with traction bronchiectasis and parenchymal scarring in the  parahilar right lung, consistent with sequelae of radiation therapy. No new pulmonary nodule or mass on today's study. No pleural effusion. Upper Abdomen: 2.0 cm left adrenal nodule is 2.1 cm previously. 14 mm nodule in the right adrenal gland was 12 mm previously (remeasured). Musculoskeletal: No worrisome lytic or sclerotic osseous abnormality. IMPRESSION: 1. Interval progression of bilateral pulmonary nodules, consistent with metastatic disease. 2. Stable right supraclavicular lymph nodes. 3. Stable appearance of post treatment changes in the right hemithorax. 4. 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 5. Aortic Atherosclerosis (ICD10-I70.0). Electronically Signed   By: Misty Stanley M.D.   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  PLAN:    1.  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  He subsequently completed 1 year of maintenance durvalumab on August 27, 2018. CT scan on September 08, 2019 revealed progressive lymphadenopathy in his right supraclavicular and right thoracic inlet.  PET scan completed on September 27, 2019 confirmed likely recurrence.  Biopsy on October 04, 2019 consistent with squamous cell carcinoma. He completed XRT only for his local recurrence on November 17, 2019.  Repeat CT scan on August 28, 2020 reviewed independently and reported as above with continued progression of 3 pulmonary nodules in his left and right upper lobe as well as right lower lobe.  PET scan results completed on September 18, 2020 reviewed independently revealing at the right lower lobe nodule has a minimally elevated SUV of 2.9, the right upper lobe nodule has an SUV of 7.7 and additional 11 mm nodule in the left apex as an SUV of 5.3.  This is likely recurrence, but given the  minimal disease burden patient may benefit from XRT only.  Will discuss at tumor board next week to determine the best course of action for treatment.  Follow-up will be based on discussion.   2.  Hypertension: Significantly improved.  Patient's blood pressure is within normal limits today. 3.  Shortness of breath/cough: Resolved. 4.  Hyperglycemia: Continue evaluation and treatment per primary care.   I provided 20 minutes of face-to-face video visit time during this encounter which included chart review, counseling, and coordination of care as documented above.  Patient expressed understanding and was in agreement with this plan. He also understands that He can call clinic at any time with any questions, concerns, or complaints.   Cancer Staging Squamous cell lung cancer, left [facility]) Staging form: Lung, AJCC 8th Edition - Clinical stage from 05/16/2017: Stage IIIB (cT3,  cN2, cM0) - Signed by Lloyd Huger, MD on 05/16/2017   Lloyd Huger, MD   09/14/2020 8:45 AM

## 2020-09-18 ENCOUNTER — Other Ambulatory Visit: Payer: Self-pay

## 2020-09-18 ENCOUNTER — Ambulatory Visit
Admission: RE | Admit: 2020-09-18 | Discharge: 2020-09-18 | Disposition: A | Discharge status: Home / Self Care | Payer: 59 | Source: Ambulatory Visit | Attending: Oncology | Admitting: Oncology

## 2020-09-18 DIAGNOSIS — D490 Neoplasm of unspecified behavior of digestive system: Secondary | ICD-10-CM | POA: Diagnosis not present

## 2020-09-18 DIAGNOSIS — C3492 Malignant neoplasm of unspecified part of left bronchus or lung: Secondary | ICD-10-CM

## 2020-09-18 DIAGNOSIS — C7801 Secondary malignant neoplasm of right lung: Secondary | ICD-10-CM | POA: Diagnosis present

## 2020-09-18 DIAGNOSIS — Y842 Radiological procedure and radiotherapy as the cause of abnormal reaction of the patient, or of later complication, without mention of misadventure at the time of the procedure: Secondary | ICD-10-CM | POA: Insufficient documentation

## 2020-09-18 DIAGNOSIS — C7802 Secondary malignant neoplasm of left lung: Secondary | ICD-10-CM | POA: Insufficient documentation

## 2020-09-18 DIAGNOSIS — D3502 Benign neoplasm of left adrenal gland: Secondary | ICD-10-CM | POA: Insufficient documentation

## 2020-09-18 LAB — GLUCOSE, CAPILLARY: Glucose-Capillary: 142 mg/dL — ABNORMAL HIGH (ref 70–99)

## 2020-09-18 MED ORDER — FLUDEOXYGLUCOSE F - 18 (FDG) INJECTION
10.5900 | Freq: Once | INTRAVENOUS | Status: AC | PRN
  Administered 2020-09-18: 10.59 via INTRAVENOUS

## 2020-09-19 ENCOUNTER — Inpatient Hospital Stay: Payer: 59 | Attending: Oncology | Admitting: Oncology

## 2020-09-19 ENCOUNTER — Telehealth: Payer: Self-pay | Admitting: *Deleted

## 2020-09-19 DIAGNOSIS — C3492 Malignant neoplasm of unspecified part of left bronchus or lung: Secondary | ICD-10-CM

## 2020-09-19 NOTE — Telephone Encounter (Signed)
Attempted to contact patient of chart information left message.

## 2020-09-19 NOTE — Progress Notes (Signed)
Patient pre screened for video visit.

## 2020-09-28 ENCOUNTER — Inpatient Hospital Stay: Payer: 59

## 2020-09-28 NOTE — Progress Notes (Signed)
Tumor Board Documentation  Gabriel Mendez was presented by Dr Grayland Ormond at our Tumor Board on 09/28/2020, which included representatives from medical oncology,radiation oncology,internal medicine,navigation,pathology,radiology,pharmacy,genetics,research,palliative care,pulmonology.  Gabriel Mendez currently presents as a current patient,for discussion with history of the following treatments: active survellience,immunotherapy,neoadjuvant chemoradiation,surgical intervention(s).  Additionally, we reviewed previous medical and familial history, history of present illness, and recent lab results along with all available histopathologic and imaging studies. The tumor board considered available treatment options and made the following recommendations: Radiation therapy (primary modality)    The following procedures/referrals were also placed: No orders of the defined types were placed in this encounter.   Clinical Trial Status: not discussed   Staging used: AJCC Stage Group  AJCC Staging:       Group: Stage III Squamous Cell Lung Cancer   National site-specific guidelines NCCN were discussed with respect to the case.  Tumor board is a meeting of clinicians from various specialty areas who evaluate and discuss patients for whom a multidisciplinary approach is being considered. Final determinations in the plan of care are those of the provider(s). The responsibility for follow up of recommendations given during tumor board is that of the provider.   Today's extended care, comprehensive team conference, Gabriel Mendez was not present for the discussion and was not examined.   Multidisciplinary Tumor Board is a multidisciplinary case peer review process.  Decisions discussed in the Multidisciplinary Tumor Board reflect the opinions of the specialists present at the conference without having examined the patient.  Ultimately, treatment and diagnostic decisions rest with the primary provider(s) and the patient.

## 2020-11-02 ENCOUNTER — Ambulatory Visit
Admission: RE | Admit: 2020-11-02 | Discharge: 2020-11-02 | Disposition: A | Discharge status: Home / Self Care | Payer: 59 | Source: Ambulatory Visit | Attending: Radiation Oncology | Admitting: Radiation Oncology

## 2020-11-02 ENCOUNTER — Other Ambulatory Visit: Payer: Self-pay | Admitting: *Deleted

## 2020-11-02 ENCOUNTER — Encounter: Payer: Self-pay | Admitting: Radiation Oncology

## 2020-11-02 VITALS — BP 156/88 | HR 101 | Temp 97.6°F | Resp 20 | Wt 193.9 lb

## 2020-11-02 DIAGNOSIS — C7951 Secondary malignant neoplasm of bone: Secondary | ICD-10-CM | POA: Insufficient documentation

## 2020-11-02 DIAGNOSIS — C3492 Malignant neoplasm of unspecified part of left bronchus or lung: Secondary | ICD-10-CM

## 2020-11-02 DIAGNOSIS — Z9221 Personal history of antineoplastic chemotherapy: Secondary | ICD-10-CM | POA: Diagnosis not present

## 2020-11-02 DIAGNOSIS — C7802 Secondary malignant neoplasm of left lung: Secondary | ICD-10-CM | POA: Diagnosis not present

## 2020-11-02 DIAGNOSIS — Z923 Personal history of irradiation: Secondary | ICD-10-CM | POA: Insufficient documentation

## 2020-11-02 NOTE — Progress Notes (Signed)
Radiation Oncology Follow up Note  Name: Gabriel Mendez   Date:   11/02/2020 MRN:  [id number] DOB: Jan 20, 1961    This 60 y.o. 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  REFERRING PROVIDER: Lynnell Jude, MD  HPI: Patient is a 60 year old male now out 11 months having completed salvage radiation therapy to his right supraclavicular fossa for recurrent disease now out over 3 years status post concurrent chemoradiation for stage IIIb squamous cell carcinoma.  Clinically he is doing well specifically [place] cough hemoptysis or chest tightness.  Patient does have multiple bilateral nodules the most hypermetabolic on the left upper lobe all consistent with metastatic disease.  Patient has been on immunotherapy1 year of maintenance durvalumab.  Again he continues to be asymptomatic and specifically denies bone pain.  COMPLICATIONS OF TREATMENT: none  FOLLOW UP COMPLIANCE: keeps appointments   PHYSICAL EXAM:  BP (!) 156/88   Pulse (!) 101   Temp 97.6 F (36.4 C) (Tympanic)   Resp 20   Wt 193 lb 14.4 oz (88 kg)   BMI 29.48 kg/m  Well-developed well-nourished patient in NAD. HEENT reveals PERLA, EOMI, discs not visualized.  Oral cavity is clear. No oral mucosal lesions are identified. Neck is clear without evidence of cervical or supraclavicular adenopathy. Lungs are clear to A&P. Cardiac examination is essentially unremarkable with regular rate and rhythm without murmur rub or thrill. Abdomen is benign with no organomegaly or masses noted. Motor sensory and DTR levels are equal and symmetric in the upper and lower extremities. Cranial nerves II through XII are grossly intact. Proprioception is intact. No peripheral adenopathy or edema is identified. No motor or sensory levels are noted. Crude  visual fields are within normal range.  RADIOLOGY RESULTS: CT scans and PET CT scans reviewed compatible with above-stated findings  PLAN: Present time these nodules are very slow-growing and are patient is asymptomatic.  The 1 area of concern is the left upper lobe which is the most hypermetabolic with multiple nodules present.  I would recommend continued surveillance at this time I will repeat his scans in about 3 months and see him back should there be progression in any of these areas we may entertain the idea of SBRT.  Patient is comfortable at this time with continued observation.  I would like to take this opportunity to thank you for allowing me to participate in the care of your patient.[name], MD

## 2021-01-27 NOTE — Progress Notes (Deleted)
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  INTERVAL HISTORY: Patient agreed to video assisted telemedicine visit for further evaluation and discussion of his PET scan results.  He currently feels well and is asymptomatic.  He continues to be active and work full-time. He has no neurologic complaints. He has a good appetite and denies weight loss.  He denies any pain.  He denies any chest pain, cough, hemoptysis, or shortness of breath.  He has no nausea, vomiting, constipation, or diarrhea.  He has no urinary complaints.  Patient offers no specific complaints today.  REVIEW OF SYSTEMS:   Review of Systems  Constitutional: Negative.  Negative for fever, malaise/fatigue and weight loss.  HENT: Negative.  Negative for congestion.   Respiratory: Negative.  Negative for cough, hemoptysis and shortness of breath.   Cardiovascular: Negative.  Negative for chest pain and leg swelling.  Gastrointestinal: Negative.  Negative for abdominal pain and heartburn.  Genitourinary: Negative.  Negative for dysuria.  Musculoskeletal: Negative.  Negative for joint pain.  Skin: Negative.  Negative for rash.  Neurological: Negative.  Negative for dizziness, sensory change, focal weakness, weakness and headaches.  Psychiatric/Behavioral: Negative.  The patient is not nervous/anxious.     As per HPI. Otherwise, a complete review of systems is negative.  PAST MEDICAL HISTORY: Past Medical History:  Diagnosis Date   Asthma    COPD (chronic obstructive pulmonary  disease) ([place])    Diabetes mellitus without complication ([place])    Hypertension    Lung cancer ([place]) 05/2017   Hx Chemo + rad tx's.   Pneumonia     PAST SURGICAL HISTORY: Past Surgical History:  Procedure Laterality Date   ENDOBRONCHIAL ULTRASOUND N/A 05/12/2017   Procedure: ENDOBRONCHIAL ULTRASOUND;  Surgeon: Laverle Hobby, MD;  Location: [facility] ORS;  Service: Pulmonary;  Laterality: N/A;   FRACTURE SURGERY Left    ANKLE X 2   TONSILLECTOMY      FAMILY HISTORY: Family History  Problem Relation Age of Onset   Stroke Mother    Diabetes Mother    Hypertension Mother    Diabetes Father    Hypertension Father    Diabetes Sister    Diabetes Brother    Heart attack Paternal Uncle    Stroke Maternal Grandmother     ADVANCED DIRECTIVES (Y/N):  N  HEALTH MAINTENANCE: Social History   Tobacco Use   Smoking status: Some Days    Packs/day: 0.50    Types: Cigarettes   Smokeless tobacco: Never  Vaping Use   Vaping Use: Never used  Substance Use Topics   Alcohol use: No   Drug use: No     Colonoscopy:  PAP:  Bone density:  Lipid panel:  Allergies  Allergen Reactions   Shrimp [Shellfish Allergy] Nausea And Vomiting    Current Outpatient Medications  Medication Sig Dispense Refill   albuterol (PROVENTIL) (2.5 MG/3ML) 0.083% nebulizer solution Take 2.5 mg by nebulization every 6 (six) hours as needed for wheezing or shortness of breath.     albuterol (VENTOLIN HFA) 108 (90 Base) MCG/ACT inhaler Inhale 2 puffs into the lungs every  6 (six) hours as needed for wheezing or shortness of breath. 1 Inhaler 0   ALPRAZolam (XANAX) 0.5 MG tablet Take 1 tablet (0.5 mg total) by mouth as needed for anxiety (Before radiation treatments). 10 tablet 0   amLODipine (NORVASC) 5 MG tablet Take 5 mg by mouth daily.     ANORO ELLIPTA 62.5-25 MCG/INH AEPB INHALE 1 PUFF INTO THE LUNGS DAILY 60 each 2   atorvastatin (LIPITOR) 20 MG tablet Take 20 mg by mouth daily.     glipiZIDE  (GLUCOTROL) 5 MG tablet Take 10 mg by mouth daily before breakfast.      ibuprofen (ADVIL,MOTRIN) 200 MG tablet Take 400-800 mg every 8 (eight) hours as needed by mouth (for pain.).     lisinopril-hydrochlorothiazide (ZESTORETIC) 20-25 MG tablet Take 1 tablet by mouth daily. (Patient taking differently: Take 2 tablets by mouth daily.) 30 tablet 0   metFORMIN (GLUCOPHAGE) 1000 MG tablet Take 1 tablet (1,000 mg total) by mouth 2 (two) times daily. 60 tablet 0   omeprazole (PRILOSEC) 20 MG capsule TAKE 1 CAPSULE(20 MG) BY MOUTH DAILY 30 capsule 2   TRELEGY ELLIPTA 200-62.5-25 MCG/INH AEPB Take 1 puff by mouth daily.     No current facility-administered medications for this visit.    OBJECTIVE: There were no vitals filed for this visit.   There is no height or weight on file to calculate BMI.    ECOG FS:0 - Asymptomatic  General: Well-developed, well-nourished, no acute distress. HEENT: Normocephalic. Neuro: Alert, answering all questions appropriately. Cranial nerves grossly intact. Psych: Normal affect.   LAB RESULTS:  Lab Results  Component Value Date   NA 129 (L) 11/12/2019   K 3.8 11/12/2019   CL 89 (L) 11/12/2019   CO2 27 11/12/2019   GLUCOSE 339 (H) 11/12/2019   BUN 18 11/12/2019   CREATININE 0.80 08/28/2020   CALCIUM 8.9 11/12/2019   PROT 6.9 11/12/2019   ALBUMIN 3.9 11/12/2019   AST 17 11/12/2019   ALT 23 11/12/2019   ALKPHOS 96 11/12/2019   BILITOT 0.6 11/12/2019   GFRNONAA >60 11/12/2019   GFRAA >60 11/12/2019    Lab Results  Component Value Date   WBC 7.7 11/12/2019   NEUTROABS 6.0 11/12/2019   HGB 15.3 11/12/2019   HCT 44.1 11/12/2019   MCV 85.1 11/12/2019   PLT 239 11/12/2019     STUDIES: No results found.   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  PLAN:    1.  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  He subsequently completed 1 year of maintenance durvalumab on August 27, 2018. CT scan on September 08, 2019 revealed progressive lymphadenopathy in his right supraclavicular and right thoracic inlet.  PET scan completed on September 27, 2019 confirmed likely recurrence.  Biopsy on October 04, 2019 consistent with squamous cell carcinoma. He completed XRT only for his local recurrence on November 17, 2019.  Repeat CT scan on August 28, 2020 reviewed independently and reported as above with continued progression of 3 pulmonary nodules in his left and right upper lobe as well as right lower lobe.  PET scan results completed on September 18, 2020 reviewed independently revealing at the right lower lobe nodule has a minimally elevated SUV of 2.9, the right upper lobe nodule has an SUV of 7.7 and additional 11   mm nodule in the left apex as an SUV of 5.3.  This is likely recurrence, but given the minimal disease burden patient may benefit from XRT only.  Will discuss at tumor board next week to determine the best course of action for treatment.  Follow-up will be based on discussion.   2.  Hypertension: Significantly improved.  Patient's blood pressure is within normal limits today. 3.  Shortness of breath/cough: Resolved. 4.  Hyperglycemia: Continue evaluation and treatment per primary care.   Patient expressed understanding and was in agreement with this plan. He also understands that He can call clinic at any time with any questions, concerns, or complaints.   Cancer Staging Squamous cell lung cancer, left [facility]) Staging form: Lung, AJCC 8th Edition - Clinical stage from 05/16/2017: Stage IIIB (cT3, cN2, cM0) - Signed by Lloyd Huger, MD on 05/16/2017   Lloyd Huger, MD   01/27/2021 10:07 AM

## 2021-01-29 ENCOUNTER — Other Ambulatory Visit: Payer: Self-pay

## 2021-01-29 ENCOUNTER — Ambulatory Visit
Admission: RE | Admit: 2021-01-29 | Discharge: 2021-01-29 | Disposition: A | Discharge status: Home / Self Care | Payer: 59 | Source: Ambulatory Visit | Attending: Oncology | Admitting: Oncology

## 2021-01-29 DIAGNOSIS — C3492 Malignant neoplasm of unspecified part of left bronchus or lung: Secondary | ICD-10-CM | POA: Insufficient documentation

## 2021-01-29 LAB — POCT I-STAT CREATININE: Creatinine, Ser: 0.9 mg/dL (ref 0.61–1.24)

## 2021-01-29 MED ORDER — IOHEXOL 300 MG/ML  SOLN
75.0000 mL | Freq: Once | INTRAMUSCULAR | Status: AC | PRN
  Administered 2021-01-29: 75 mL via INTRAVENOUS

## 2021-02-01 ENCOUNTER — Ambulatory Visit: Payer: 59 | Admitting: Radiation Oncology

## 2021-02-01 ENCOUNTER — Inpatient Hospital Stay: Payer: 59 | Admitting: Oncology

## 2021-02-01 ENCOUNTER — Ambulatory Visit
Admission: RE | Admit: 2021-02-01 | Discharge: 2021-02-01 | Disposition: A | Discharge status: Home / Self Care | Payer: 59 | Source: Ambulatory Visit | Attending: Radiation Oncology | Admitting: Radiation Oncology

## 2021-02-01 ENCOUNTER — Encounter: Payer: Self-pay | Admitting: Radiation Oncology

## 2021-02-01 VITALS — BP 169/97 | HR 97 | Temp 97.0°F | Wt 191.0 lb

## 2021-02-01 DIAGNOSIS — F17210 Nicotine dependence, cigarettes, uncomplicated: Secondary | ICD-10-CM | POA: Diagnosis not present

## 2021-02-01 DIAGNOSIS — Z923 Personal history of irradiation: Secondary | ICD-10-CM | POA: Insufficient documentation

## 2021-02-01 DIAGNOSIS — C7951 Secondary malignant neoplasm of bone: Secondary | ICD-10-CM | POA: Diagnosis not present

## 2021-02-01 DIAGNOSIS — C3492 Malignant neoplasm of unspecified part of left bronchus or lung: Secondary | ICD-10-CM

## 2021-02-01 NOTE — Progress Notes (Signed)
Radiation Oncology Follow up Note  Name: Gabriel Mendez   Date:   02/01/2021 MRN:  [id number] DOB: 08/16/1960    This 60 y.o. 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  REFERRING PROVIDER: Lynnell Jude, MD  HPI: Patient is a.  60 year old male now out for 2 months having completed salvage radiation therapy.to his right supraclavicular fossa and patient treated out over 3 years prior for stage IIIa squamous cell carcinoma with concurrent chemoradiation.  He is seen today in routine follow-up is doing well specifically Nuys cough hemoptysis or chest tightness.  His most recent CT scan shows enlargement of bilateral pulmonary nodules consistent with progressive metastatic disease.  These have been present since on his PET scan back in April and they were hypermetabolic at that time.  He has a right lower lobe lesion as well as 2 lesions in near proximity to each other in the left upper lobe.   COMPLICATIONS OF TREATMENT: none  FOLLOW UP COMPLIANCE: keeps appointments   PHYSICAL EXAM:  BP (!) 169/97   Pulse 97   Temp (!) 97 F (36.1 C)   Wt 191 lb (86.6 kg)   BMI 29.04 kg/m  Well-developed well-nourished patient in NAD. HEENT reveals PERLA, EOMI, discs not visualized.  Oral cavity is clear. No oral mucosal lesions are identified. Neck is clear without evidence of cervical or supraclavicular adenopathy. Lungs are clear to A&P. Cardiac examination is essentially unremarkable with regular rate and rhythm without murmur rub or thrill. Abdomen is benign with no organomegaly or masses noted. Motor sensory and DTR levels are equal and symmetric in the upper and lower extremities. Cranial nerves II through XII are grossly intact. Proprioception is intact. No peripheral  adenopathy or edema is identified. No motor or sensory levels are noted. Crude visual fields are within normal range.  RADIOLOGY RESULTS: CT scans and prior PET CT scan reviewed compatible with above-stated findings  PLAN: At the present time I have opted to recommend SBRT to his right lower lobe lesion.  We will plan on delivering 60 Gray in 5 fractions.  Risks and benefits of SBRT including possible development of cough fatigue alteration blood counts all were discussed in detail with the patient.  Once that is complete and he is tolerated that well we may offer SBRT to his left upper lobe lesion since this is the only area of disease at this time.  Patient comprehends my recommendations well.  I would like to take this opportunity to thank you for allowing me to participate in the care of your patient.[name], MD

## 2021-02-02 ENCOUNTER — Ambulatory Visit: Payer: 59 | Admitting: Radiation Oncology

## 2021-02-06 ENCOUNTER — Ambulatory Visit
Admission: RE | Admit: 2021-02-06 | Discharge: 2021-02-06 | Disposition: A | Discharge status: Home / Self Care | Payer: 59 | Source: Ambulatory Visit | Attending: Radiation Oncology | Admitting: Radiation Oncology

## 2021-02-06 DIAGNOSIS — Z510 Encounter for antineoplastic radiation therapy: Secondary | ICD-10-CM | POA: Diagnosis not present

## 2021-02-06 DIAGNOSIS — C7951 Secondary malignant neoplasm of bone: Secondary | ICD-10-CM | POA: Diagnosis not present

## 2021-02-06 DIAGNOSIS — C3492 Malignant neoplasm of unspecified part of left bronchus or lung: Secondary | ICD-10-CM | POA: Insufficient documentation

## 2021-02-13 ENCOUNTER — Ambulatory Visit
Admission: RE | Admit: 2021-02-13 | Discharge: 2021-02-13 | Disposition: A | Discharge status: Home / Self Care | Payer: 59 | Source: Ambulatory Visit | Attending: Radiation Oncology | Admitting: Radiation Oncology

## 2021-02-13 DIAGNOSIS — C3492 Malignant neoplasm of unspecified part of left bronchus or lung: Secondary | ICD-10-CM | POA: Diagnosis not present

## 2021-02-15 ENCOUNTER — Ambulatory Visit
Admission: RE | Admit: 2021-02-15 | Discharge: 2021-02-15 | Disposition: A | Discharge status: Home / Self Care | Payer: 59 | Source: Ambulatory Visit | Attending: Radiation Oncology | Admitting: Radiation Oncology

## 2021-02-15 DIAGNOSIS — C7951 Secondary malignant neoplasm of bone: Secondary | ICD-10-CM | POA: Insufficient documentation

## 2021-02-15 DIAGNOSIS — Z510 Encounter for antineoplastic radiation therapy: Secondary | ICD-10-CM | POA: Diagnosis not present

## 2021-02-15 DIAGNOSIS — C3492 Malignant neoplasm of unspecified part of left bronchus or lung: Secondary | ICD-10-CM | POA: Insufficient documentation

## 2021-02-20 ENCOUNTER — Ambulatory Visit
Admission: RE | Admit: 2021-02-20 | Discharge: 2021-02-20 | Disposition: A | Discharge status: Home / Self Care | Payer: 59 | Source: Ambulatory Visit | Attending: Radiation Oncology | Admitting: Radiation Oncology

## 2021-02-20 DIAGNOSIS — C3492 Malignant neoplasm of unspecified part of left bronchus or lung: Secondary | ICD-10-CM | POA: Diagnosis not present

## 2021-02-22 ENCOUNTER — Ambulatory Visit
Admission: RE | Admit: 2021-02-22 | Discharge: 2021-02-22 | Disposition: A | Discharge status: Home / Self Care | Payer: 59 | Source: Ambulatory Visit | Attending: Radiation Oncology | Admitting: Radiation Oncology

## 2021-02-22 DIAGNOSIS — C3492 Malignant neoplasm of unspecified part of left bronchus or lung: Secondary | ICD-10-CM | POA: Diagnosis not present

## 2021-02-24 NOTE — Progress Notes (Signed)
[facility]  [phone] [phone]) [phone]  ID: ACEN CRAUN OB: 07/08/1960  MR#: [id number]  MVV#:[id number]  Patient Care Team: Lynnell Jude, MD as PCP - General (Family Medicine) Lloyd Huger, MD as Consulting Physician (Oncology) [name], MD as Referring Physician (Radiation Oncology) Telford Nab, RN as Registered Nurse   CHIEF COMPLAINT: Recurrent squamous cell carcinoma of the left lung, all molecular markers and PD-L1 are negative.  INTERVAL HISTORY: Patient returns to clinic today for further evaluation at the end of his positive XRT.  He is noted some increased back pain, but otherwise feels well and tolerated his treatments without significant side effects.  He continues to be active and work full-time. He has no neurologic complaints. He has a good appetite and denies weight loss.  He denies any other pain.  He denies any chest pain, cough, hemoptysis, or shortness of breath.  He has no nausea, vomiting, constipation, or diarrhea.  He has no urinary complaints.  Patient offers no further specific complaints today.  REVIEW OF SYSTEMS:   Review of Systems  Constitutional: Negative.  Negative for fever, malaise/fatigue and weight loss.  HENT: Negative.  Negative for congestion.   Respiratory: Negative.  Negative for cough, hemoptysis and shortness of breath.   Cardiovascular: Negative.  Negative for chest pain and leg swelling.  Gastrointestinal: Negative.  Negative for abdominal pain and heartburn.  Genitourinary: Negative.  Negative for dysuria.  Musculoskeletal:  Positive for back pain. Negative for joint pain.  Skin: Negative.  Negative for rash.  Neurological: Negative.  Negative for dizziness, sensory change, focal weakness, weakness and headaches.  Psychiatric/Behavioral: Negative.  The patient is not nervous/anxious.    As per HPI. Otherwise, a complete review of systems is negative.  PAST MEDICAL HISTORY: Past  Medical History:  Diagnosis Date   Asthma    COPD (chronic obstructive pulmonary disease) ([place])    Diabetes mellitus without complication ([place])    Hypertension    Lung cancer ([place]) 05/2017   Hx Chemo + rad tx's.   Pneumonia     PAST SURGICAL HISTORY: Past Surgical History:  Procedure Laterality Date   ENDOBRONCHIAL ULTRASOUND N/A 05/12/2017   Procedure: ENDOBRONCHIAL ULTRASOUND;  Surgeon: Laverle Hobby, MD;  Location: [facility] ORS;  Service: Pulmonary;  Laterality: N/A;   FRACTURE SURGERY Left    ANKLE X 2   TONSILLECTOMY      FAMILY HISTORY: Family History  Problem Relation Age of Onset   Stroke Mother    Diabetes Mother    Hypertension Mother    Diabetes Father    Hypertension Father    Diabetes Sister    Diabetes Brother    Heart attack Paternal Uncle    Stroke Maternal Grandmother     ADVANCED DIRECTIVES (Y/N):  N  HEALTH MAINTENANCE: Social History   Tobacco Use   Smoking status: Some Days    Packs/day: 0.50    Types: Cigarettes   Smokeless tobacco: Never  Vaping Use   Vaping Use: Never used  Substance Use Topics   Alcohol use: No   Drug use: No     Colonoscopy:  PAP:  Bone density:  Lipid panel:  Allergies  Allergen Reactions   Shrimp [Shellfish Allergy] Nausea And Vomiting    Current Outpatient Medications  Medication Sig Dispense Refill   albuterol (PROVENTIL) (2.5 MG/3ML) 0.083% nebulizer solution Take 2.5 mg by nebulization every 6 (six) hours as needed for wheezing or shortness of breath.     albuterol (  VENTOLIN HFA) 108 (90 Base) MCG/ACT inhaler Inhale 2 puffs into the lungs every 6 (six) hours as needed for wheezing or shortness of breath. 1 Inhaler 0   amLODipine (NORVASC) 5 MG tablet Take 5 mg by mouth daily.     atorvastatin (LIPITOR) 20 MG tablet Take 20 mg by mouth daily.     glipiZIDE (GLUCOTROL) 5 MG tablet Take 10 mg by mouth daily before breakfast.      ibuprofen (ADVIL,MOTRIN) 200 MG tablet Take 400-800 mg every 8 (eight)  hours as needed by mouth (for pain.).     lisinopril-hydrochlorothiazide (ZESTORETIC) 20-25 MG tablet Take 1 tablet by mouth daily. (Patient taking differently: Take 2 tablets by mouth daily.) 30 tablet 0   metFORMIN (GLUCOPHAGE) 1000 MG tablet Take 1 tablet (1,000 mg total) by mouth 2 (two) times daily. 60 tablet 0   TRELEGY ELLIPTA 200-62.5-25 MCG/INH AEPB Take 1 puff by mouth daily.     ALPRAZolam (XANAX) 0.5 MG tablet Take 1 tablet (0.5 mg total) by mouth as needed for anxiety (Before radiation treatments). (Patient not taking: Reported on 02/28/2021) 10 tablet 0   ANORO ELLIPTA 62.5-25 MCG/INH AEPB INHALE 1 PUFF INTO THE LUNGS DAILY (Patient not taking: Reported on 02/28/2021) 60 each 2   omeprazole (PRILOSEC) 20 MG capsule TAKE 1 CAPSULE(20 MG) BY MOUTH DAILY (Patient not taking: Reported on 02/28/2021) 30 capsule 2   No current facility-administered medications for this visit.    OBJECTIVE: Vitals:   02/28/21 0859  BP: (!) 149/92  Pulse: (!) 101  Resp: 18  Temp: (!) 96.2 F (35.7 C)  SpO2: 100%     Body mass index is 30.26 kg/m.    ECOG FS:0 - Asymptomatic  General: Well-developed, well-nourished, no acute distress. Eyes: Pink conjunctiva, anicteric sclera. HEENT: Normocephalic, moist mucous membranes. Lungs: No audible wheezing or coughing. Heart: Regular rate and rhythm. Abdomen: Soft, nontender, no obvious distention. Musculoskeletal: No edema, cyanosis, or clubbing. Neuro: Alert, answering all questions appropriately. Cranial nerves grossly intact. Skin: No rashes or petechiae noted. Psych: Normal affect.   LAB RESULTS:  Lab Results  Component Value Date   NA 129 (L) 11/12/2019   K 3.8 11/12/2019   CL 89 (L) 11/12/2019   CO2 27 11/12/2019   GLUCOSE 339 (H) 11/12/2019   BUN 18 11/12/2019   CREATININE 0.90 01/29/2021   CALCIUM 8.9 11/12/2019   PROT 6.9 11/12/2019   ALBUMIN 3.9 11/12/2019   AST 17 11/12/2019   ALT 23 11/12/2019   ALKPHOS 96 11/12/2019    BILITOT 0.6 11/12/2019   GFRNONAA >60 11/12/2019   GFRAA >60 11/12/2019    Lab Results  Component Value Date   WBC 7.7 11/12/2019   NEUTROABS 6.0 11/12/2019   HGB 15.3 11/12/2019   HCT 44.1 11/12/2019   MCV 85.1 11/12/2019   PLT 239 11/12/2019     STUDIES: No results found.   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  PLAN:    1.  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  He subsequently completed 1 year of maintenance durvalumab on August 27, 2018. CT scan on September 08, 2019 revealed progressive lymphadenopathy in his right supraclavicular and right thoracic inlet.  PET scan completed on September 27, 2019 confirmed likely recurrence.  Biopsy on October 04, 2019 consistent with squamous cell carcinoma.  He completed XRT only for his local recurrence on November 17, 2019.  Repeat CT scan on August 28, 2020 reviewed independently with continued progression of 3 pulmonary nodules in his left and right upper lobe as well as right lower lobe.  PET scan results completed on September 18, 2020 revealed a right lower lobe nodule has a minimally elevated SUV of 2.9, the right upper lobe nodule has an SUV of 7.7 and additional 11 mm nodule in the left apex as an SUV of 5.3.  Patient has now completed XRT to these lesions.  No further treatments are needed at this time, but patient expressed understanding that he likely will require systemic treatment in the near future.  Return to clinic in 3 months with repeat imaging and further evaluation.    2.  Hypertension: Blood pressure moderately elevated today.  Continue evaluation and treatment per primary care. 3.  Shortness of breath/cough: Resolved. 4.  Hyperglycemia: Continue evaluation and treatment per primary care. 5.  Back pain: Patient has been  instructed to call clinic in the next 1 to 2 weeks if it does not resolve.   Patient expressed understanding and was in agreement with this plan. He also understands that He can call clinic at any time with any questions, concerns, or complaints.   Cancer Staging Squamous cell lung cancer, left [facility]) Staging form: Lung, AJCC 8th Edition - Clinical stage from 05/16/2017: Stage IIIB (cT3, cN2, cM0) - Signed by Lloyd Huger, MD on 05/16/2017   Lloyd Huger, MD   03/01/2021 10:35 AM

## 2021-02-27 ENCOUNTER — Ambulatory Visit
Admission: RE | Admit: 2021-02-27 | Discharge: 2021-02-27 | Disposition: A | Discharge status: Home / Self Care | Payer: 59 | Source: Ambulatory Visit | Attending: Radiation Oncology | Admitting: Radiation Oncology

## 2021-02-27 DIAGNOSIS — C3492 Malignant neoplasm of unspecified part of left bronchus or lung: Secondary | ICD-10-CM | POA: Diagnosis not present

## 2021-02-28 ENCOUNTER — Inpatient Hospital Stay: Payer: 59 | Attending: Oncology | Admitting: Oncology

## 2021-02-28 VITALS — BP 149/92 | HR 101 | Temp 96.2°F | Resp 18 | Wt 199.0 lb

## 2021-02-28 DIAGNOSIS — C3492 Malignant neoplasm of unspecified part of left bronchus or lung: Secondary | ICD-10-CM | POA: Insufficient documentation

## 2021-02-28 DIAGNOSIS — E119 Type 2 diabetes mellitus without complications: Secondary | ICD-10-CM | POA: Diagnosis not present

## 2021-02-28 DIAGNOSIS — I10 Essential (primary) hypertension: Secondary | ICD-10-CM | POA: Diagnosis not present

## 2021-02-28 DIAGNOSIS — Z79899 Other long term (current) drug therapy: Secondary | ICD-10-CM | POA: Diagnosis not present

## 2021-02-28 DIAGNOSIS — Z7984 Long term (current) use of oral hypoglycemic drugs: Secondary | ICD-10-CM | POA: Diagnosis not present

## 2021-02-28 DIAGNOSIS — Z9221 Personal history of antineoplastic chemotherapy: Secondary | ICD-10-CM | POA: Insufficient documentation

## 2021-02-28 DIAGNOSIS — Z923 Personal history of irradiation: Secondary | ICD-10-CM | POA: Diagnosis not present

## 2021-02-28 NOTE — Progress Notes (Signed)
Pt c/o "nagging" right back/shoulder pain. Reports advil isnt helping as much as previously. States Aleve works a little better. Endorses trouble sleeping some nights but takes advil pm to help.

## 2021-03-01 ENCOUNTER — Encounter: Payer: Self-pay | Admitting: Oncology

## 2021-03-29 ENCOUNTER — Telehealth: Payer: Self-pay | Admitting: *Deleted

## 2021-03-29 NOTE — Telephone Encounter (Signed)
Patient called reporting that he is haivng back pain that varies in intensity and started about the same time that he was finishing up his radiation therapy treatments. He states that it is hard to determine the exact location of the pain, but it is in his upper back towards his shoulder and has been getting progressively worse. He is unaware of lifting or pulling anything heavy to have caused this pain and is taking ibuprofen alternating with BC powders for pain relief. He states that the pain is worse, constant during the day while he is working and eases off after he has gotten home and settled, but does not go away. It does interfere with his sleep. The worst his pain gets is 6/10 and the best is a 1-2/10. He has not tried any heat or ice to see if that would help. He has a follow up appointment with Dr Baruch Gouty 04/05/21 but does not see Medical oncology until 06/05/21. Please advise

## 2021-03-30 ENCOUNTER — Encounter: Payer: Self-pay | Admitting: Oncology

## 2021-03-30 NOTE — Telephone Encounter (Signed)
After speaking with J Borders, NP, call was returned to patient and he was offered an appointment to see [facility] Monday in person or to have a virtual visit today and that we could give him pain medicine to get him through the weekend. He reports that he used [facility] on area last evening and he is actually having a lot less pain today. He opts to wait until his appointment Friday with Dr Baruch Gouty. I advised him that if the pain gets worse and he cannot wait until Friday to call me back and we would get him in to be seen. He thanked me for everything and agrees to call back if needed stating that he thinks it may be muscular in nature since the [facility] worked so well.

## 2021-04-06 ENCOUNTER — Encounter: Payer: Self-pay | Admitting: Radiation Oncology

## 2021-04-06 ENCOUNTER — Ambulatory Visit
Admission: RE | Admit: 2021-04-06 | Discharge: 2021-04-06 | Disposition: A | Discharge status: Home / Self Care | Payer: 59 | Source: Ambulatory Visit | Attending: Radiation Oncology | Admitting: Radiation Oncology

## 2021-04-06 ENCOUNTER — Other Ambulatory Visit: Payer: Self-pay

## 2021-04-06 ENCOUNTER — Other Ambulatory Visit: Payer: Self-pay | Admitting: *Deleted

## 2021-04-06 VITALS — BP 163/95 | HR 99 | Resp 22 | Wt 201.7 lb

## 2021-04-06 DIAGNOSIS — C3431 Malignant neoplasm of lower lobe, right bronchus or lung: Secondary | ICD-10-CM | POA: Insufficient documentation

## 2021-04-06 DIAGNOSIS — M25511 Pain in right shoulder: Secondary | ICD-10-CM | POA: Diagnosis not present

## 2021-04-06 DIAGNOSIS — Z923 Personal history of irradiation: Secondary | ICD-10-CM | POA: Diagnosis not present

## 2021-04-06 DIAGNOSIS — M542 Cervicalgia: Secondary | ICD-10-CM | POA: Diagnosis not present

## 2021-04-06 DIAGNOSIS — C3492 Malignant neoplasm of unspecified part of left bronchus or lung: Secondary | ICD-10-CM

## 2021-04-06 MED ORDER — LANSOPRAZOLE 30 MG PO CPDR: 30.0000 mg | DELAYED_RELEASE_CAPSULE | Freq: Every day | ORAL | 0 refills | Status: DC

## 2021-04-06 MED ORDER — PREDNISONE 10 MG (21) PO TBPK: ORAL_TABLET | ORAL | 0 refills | Status: AC

## 2021-04-06 NOTE — Progress Notes (Signed)
Radiation Oncology Follow up Note  Name: Gabriel Mendez   Date:   04/06/2021 MRN:  [id number] DOB: 1961/02/17    This 60 y.o. male presents to the clinic today for 1 month follow-up status post SBRT to his right lower lobe for presumed stage I non-small cell lung cancer. 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  He recently completed SBRT to his right lower lobe which he tolerated well.  He has been complaining of increasing pain in his right shoulder and neck of unknown etiology.  He specifically Nuys cough hemoptysis or chest tightness..  COMPLICATIONS OF TREATMENT: none  FOLLOW UP COMPLIANCE: keeps appointments   PHYSICAL EXAM:  BP (!) 163/95 (BP Location: Left Arm)   Pulse 99   Resp (!) 22   Wt 201 lb 11.2 oz (91.5 kg)   BMI 30.67 kg/m  Range of motion of his right upper extremities does not elicit pain.  Well-developed well-nourished patient in NAD. HEENT reveals PERLA, EOMI, discs not visualized.  Oral cavity is clear. No oral mucosal lesions are identified. Neck is clear without evidence of cervical or supraclavicular adenopathy. Lungs are clear to A&P. Cardiac examination is essentially unremarkable with regular rate and rhythm without murmur rub or thrill. Abdomen is benign with no organomegaly or masses noted. Motor sensory and DTR levels are equal and symmetric in the upper and lower extremities. Cranial nerves II through XII are grossly intact. Proprioception is intact. No peripheral adenopathy or edema is identified. No motor or sensory levels are noted. Crude visual fields are within normal range.  RADIOLOGY RESULTS: No current films for review  PLAN: Present time I am treating this as possible cervical spine  referred pain.  I am starting him on a tapered prednisone Dosepak.  Muscle putting on Prevacid.  I have also scheduled him to be seen by symptom management early next week.  He may need reimaging of his chest just to rule out possibility of metastatic disease.  I have otherwise asked to see him back in 3 months for follow-up with a CT scan at that time.  Patient conference my recommendations well.  I would like to take this opportunity to thank you for allowing me to participate in the care of your patient.[name], MD

## 2021-04-09 ENCOUNTER — Other Ambulatory Visit: Payer: Self-pay

## 2021-04-09 ENCOUNTER — Ambulatory Visit
Admission: RE | Admit: 2021-04-09 | Discharge: 2021-04-09 | Disposition: A | Discharge status: Home / Self Care | Payer: 59 | Source: Home / Self Care | Attending: Oncology | Admitting: Oncology

## 2021-04-09 ENCOUNTER — Inpatient Hospital Stay: Payer: 59 | Attending: Oncology | Admitting: Oncology

## 2021-04-09 ENCOUNTER — Ambulatory Visit
Admission: RE | Admit: 2021-04-09 | Discharge: 2021-04-09 | Disposition: A | Discharge status: Home / Self Care | Payer: 59 | Source: Ambulatory Visit | Attending: Oncology | Admitting: Oncology

## 2021-04-09 ENCOUNTER — Encounter: Payer: Self-pay | Admitting: Oncology

## 2021-04-09 VITALS — BP 148/88 | HR 100 | Temp 96.8°F | Resp 18 | Wt 201.1 lb

## 2021-04-09 DIAGNOSIS — M25511 Pain in right shoulder: Secondary | ICD-10-CM | POA: Diagnosis not present

## 2021-04-09 DIAGNOSIS — Z7984 Long term (current) use of oral hypoglycemic drugs: Secondary | ICD-10-CM | POA: Insufficient documentation

## 2021-04-09 DIAGNOSIS — F17210 Nicotine dependence, cigarettes, uncomplicated: Secondary | ICD-10-CM | POA: Diagnosis not present

## 2021-04-09 DIAGNOSIS — I10 Essential (primary) hypertension: Secondary | ICD-10-CM | POA: Diagnosis not present

## 2021-04-09 DIAGNOSIS — Z923 Personal history of irradiation: Secondary | ICD-10-CM | POA: Diagnosis not present

## 2021-04-09 DIAGNOSIS — M79601 Pain in right arm: Secondary | ICD-10-CM | POA: Insufficient documentation

## 2021-04-09 DIAGNOSIS — E119 Type 2 diabetes mellitus without complications: Secondary | ICD-10-CM | POA: Insufficient documentation

## 2021-04-09 DIAGNOSIS — Z9221 Personal history of antineoplastic chemotherapy: Secondary | ICD-10-CM | POA: Insufficient documentation

## 2021-04-09 DIAGNOSIS — C3492 Malignant neoplasm of unspecified part of left bronchus or lung: Secondary | ICD-10-CM | POA: Diagnosis present

## 2021-04-09 DIAGNOSIS — Z8701 Personal history of pneumonia (recurrent): Secondary | ICD-10-CM | POA: Insufficient documentation

## 2021-04-09 DIAGNOSIS — J449 Chronic obstructive pulmonary disease, unspecified: Secondary | ICD-10-CM | POA: Insufficient documentation

## 2021-04-09 MED ORDER — TRAMADOL HCL 50 MG PO TABS: 50.0000 mg | ORAL_TABLET | Freq: Four times a day (QID) | ORAL | 0 refills | Status: DC | PRN

## 2021-04-09 MED ORDER — LIDOCAINE 5 % EX PTCH: 1.0000 | MEDICATED_PATCH | CUTANEOUS | 0 refills | Status: DC

## 2021-04-09 NOTE — Progress Notes (Addendum)
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  INTERVAL HISTORY: Mr. Aretha Parrot is a 60 year old male with past medical history significant for left lung cancer status post systemic treatment and XRT with recent recurrent disease treated with SBRT to right lower lobe.   He presents today for an acute visit for 3 weeks of new onset right shoulder and arm pain.  Symptoms have gradually worsened causing insomnia.  He was started on prednisone taper on Friday which has improved his pain significantly.  He has been taking ibuprofen 400-600 mg every 6-8 hours with some relief.  He has not needed as much ibuprofen since starting steroids.  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  He denies any other concerns.  Denies injury similar to this in the past.  Has chronic shortness of breath.    REVIEW OF SYSTEMS:   Review of Systems  Constitutional: Negative.  Negative for chills, fever, malaise/fatigue and weight loss.  HENT:  Negative for congestion, ear pain and tinnitus.   Eyes: Negative.  Negative for blurred vision and double vision.  Respiratory:  Positive for shortness of breath. Negative for cough and sputum production.   Cardiovascular: Negative.  Negative for chest pain, palpitations and leg swelling.  Gastrointestinal: Negative.  Negative for abdominal pain, constipation, diarrhea, nausea and vomiting.  Genitourinary:  Negative for dysuria, frequency and urgency.  Musculoskeletal:  Positive for joint pain (Right  shoulder and arm pain). Negative for back pain and falls.  Skin: Negative.  Negative for rash.  Neurological: Negative.  Negative for weakness and headaches.  Endo/Heme/Allergies: Negative.  Does not bruise/bleed easily.  Psychiatric/Behavioral: Negative.  Negative for depression. The patient is not nervous/anxious and does not have insomnia.    As per HPI. Otherwise, a complete review of systems is negative.  PAST MEDICAL HISTORY: Past Medical History:  Diagnosis Date   Asthma    COPD (chronic obstructive pulmonary disease) ([place])    Diabetes mellitus without complication ([place])    Hypertension    Lung cancer ([place]) 05/2017   Hx Chemo + rad tx's.   Pneumonia     PAST SURGICAL HISTORY: Past Surgical History:  Procedure Laterality Date   ENDOBRONCHIAL ULTRASOUND N/A 05/12/2017   Procedure: ENDOBRONCHIAL ULTRASOUND;  Surgeon: Laverle Hobby, MD;  Location: [facility] ORS;  Service: Pulmonary;  Laterality: N/A;   FRACTURE SURGERY Left    ANKLE X 2   TONSILLECTOMY      FAMILY HISTORY: Family History  Problem Relation Age of Onset   Stroke Mother    Diabetes Mother    Hypertension Mother    Diabetes Father    Hypertension Father    Diabetes Sister    Diabetes Brother    Heart attack Paternal Uncle    Stroke Maternal Grandmother     ADVANCED DIRECTIVES (Y/N):  N  HEALTH MAINTENANCE: Social History   Tobacco Use   Smoking status: Some Days    Packs/day: 0.50    Types: Cigarettes   Smokeless tobacco: Never  Vaping Use   Vaping Use: Never used  Substance Use Topics  Alcohol use: No   Drug use: No     Colonoscopy:  PAP:  Bone density:  Lipid panel:  Allergies  Allergen Reactions   Shrimp [Shellfish Allergy] Nausea And Vomiting    Current Outpatient Medications  Medication Sig Dispense Refill   albuterol (PROVENTIL) (2.5 MG/3ML) 0.083% nebulizer solution Take 2.5 mg by nebulization every 6 (six) hours as needed for wheezing or shortness of breath.      albuterol (VENTOLIN HFA) 108 (90 Base) MCG/ACT inhaler Inhale 2 puffs into the lungs every 6 (six) hours as needed for wheezing or shortness of breath. 1 Inhaler 0   amLODipine (NORVASC) 5 MG tablet Take 5 mg by mouth daily.     atorvastatin (LIPITOR) 20 MG tablet Take 20 mg by mouth daily.     glipiZIDE (GLUCOTROL) 5 MG tablet Take 10 mg by mouth daily before breakfast.      ibuprofen (ADVIL,MOTRIN) 200 MG tablet Take 400-800 mg every 8 (eight) hours as needed by mouth (for pain.).     lansoprazole (PREVACID) 30 MG capsule Take 1 capsule (30 mg total) by mouth daily at 12 noon. 30 capsule 0   lidocaine (LIDODERM) 5 % Place 1 patch onto the skin daily. Remove & Discard patch within 12 hours or as directed by MD 30 patch 0   lisinopril-hydrochlorothiazide (ZESTORETIC) 20-25 MG tablet Take 1 tablet by mouth daily. 30 tablet 0   metFORMIN (GLUCOPHAGE) 1000 MG tablet Take 1 tablet (1,000 mg total) by mouth 2 (two) times daily. 60 tablet 0   predniSONE (STERAPRED UNI-PAK 21 TAB) 10 MG (21) TBPK tablet Take 6 tablets (60 mg total) by mouth daily for 1 day, THEN 5 tablets (50 mg total) daily for 1 day, THEN 4 tablets (40 mg total) daily for 1 day, THEN 3 tablets (30 mg total) daily for 1 day, THEN 2 tablets (20 mg total) daily for 1 day, THEN 1 tablet (10 mg total) daily for 1 day. 21 tablet 0   traMADol (ULTRAM) 50 MG tablet Take 1-2 tablets (50-100 mg total) by mouth every 6 (six) hours as needed. 60 tablet 0   TRELEGY ELLIPTA 200-62.5-25 MCG/INH AEPB Take 1 puff by mouth daily.     ALPRAZolam (XANAX) 0.5 MG tablet Take 1 tablet (0.5 mg total) by mouth as needed for anxiety (Before radiation treatments). (Patient not taking: No sig reported) 10 tablet 0   ANORO ELLIPTA 62.5-25 MCG/INH AEPB INHALE 1 PUFF INTO THE LUNGS DAILY (Patient not taking: No sig reported) 60 each 2   omeprazole (PRILOSEC) 20 MG capsule TAKE 1 CAPSULE(20 MG) BY MOUTH DAILY (Patient not taking: No sig reported) 30 capsule 2   No  current facility-administered medications for this visit.    OBJECTIVE: Vitals:   04/09/21 0900  BP: (!) 148/88  Pulse: 100  Resp: 18  Temp: (!) 96.8 F (36 C)  SpO2: 98%     Body mass index is 30.58 kg/m.    ECOG FS:0 - Asymptomatic  Physical Exam Constitutional:      Appearance: Normal appearance.  HENT:     Head: Normocephalic and atraumatic.  Eyes:     Pupils: Pupils are equal, round, and reactive to light.  Cardiovascular:     Rate and Rhythm: Normal rate and regular rhythm.     Heart sounds: Normal heart sounds. No murmur heard. Pulmonary:     Effort: Pulmonary effort is normal.     Breath sounds: Normal breath sounds. No wheezing.  Abdominal:  General: Bowel sounds are normal. There is no distension.     Palpations: Abdomen is soft.     Tenderness: There is no abdominal tenderness.  Musculoskeletal:        General: Normal range of motion.     Right shoulder: Tenderness present.     Right upper arm: Tenderness present.       Arms:     Cervical back: Normal range of motion.  Skin:    General: Skin is warm and dry.     Findings: No rash.  Neurological:     Mental Status: He is alert and oriented to person, place, and time.  Psychiatric:        Judgment: Judgment normal.     LAB RESULTS:  Lab Results  Component Value Date   NA 129 (L) 11/12/2019   K 3.8 11/12/2019   CL 89 (L) 11/12/2019   CO2 27 11/12/2019   GLUCOSE 339 (H) 11/12/2019   BUN 18 11/12/2019   CREATININE 0.90 01/29/2021   CALCIUM 8.9 11/12/2019   PROT 6.9 11/12/2019   ALBUMIN 3.9 11/12/2019   AST 17 11/12/2019   ALT 23 11/12/2019   ALKPHOS 96 11/12/2019   BILITOT 0.6 11/12/2019   GFRNONAA >60 11/12/2019   GFRAA >60 11/12/2019    Lab Results  Component Value Date   WBC 7.7 11/12/2019   NEUTROABS 6.0 11/12/2019   HGB 15.3 11/12/2019   HCT 44.1 11/12/2019   MCV 85.1 11/12/2019   PLT 239 11/12/2019     STUDIES: No results found.   ASSESSMENT: Recurrent squamous cell  carcinoma of the left lung, all molecular markers and PD-L1 are negative.  PLAN:    1.  1.  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  Completed maintenance nivolumab on 08/27/2018.  Completed salvage radiation to right supraclavicular fossa on 11/17/2019.  Was scheduled for a PET scan which unfortunately was denied. He had a repeat CT scan on 08/28/2020 which showed progression of 3 pulmonary nodules in his left and right upper lobe as well as right lower lobe.  PET scan from 09/18/2020 revealed a right lower lobe nodule, right upper lobe nodule and an 11 mm nodule in left apex.  He completed XRT to these lesions.  Imaging from 01/29/2021 showed enlargement of bilateral pulmonary nodules consistent with progressive metastatic disease.  He has now completed SBRT to his right lower lobe (02/28/21) which he tolerated well.  Plan is to have systemic treatment in the future as needed.  He is scheduled for surveillance imaging in December 2022.  2. Right shoulder and arm pain-unclear etiology but appears muscular given improvement with steroids.  Most recent imaging does not show any evidence of disease in that location although he has had a recent recurrence of his cancer.  Recommend imaging of right shoulder and arm.  Can try a lidocaine patch to right shoulder and tramadol 50 to 100 mg every 6-8 hours as needed for pain.  I have asked him to cut back on the amount of ibuprofen he is taking.  Complete steroid taper.  We will call patient with results from imaging.  If imaging is negative, recommend completing steroids versus referral to Ortho versus additional imaging.  Disposition: X-ray of right cervical spine, shoulder and humerus. Can try tramadol 5100 mg every 6-8 hours as needed as well as a lidocaine patch to right shoulder-change every  24 hours.  Continue prednisone. Keep scheduled  follow-up. Repeat CT scan in December 2022.  Addendum- X-rays negative. 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 Continue Steroid taper. Can try tramadol and lidocaine patch as well. Call clinic if symptoms worsen or fail to improve.   I spent 25 minutes dedicated to the care of this patient (face-to-face and non-face-to-face) on the date of the encounter to include what is described in the assessment and plan.  Patient expressed understanding and was in agreement with this plan. He also understands that He can call clinic at any time with any questions, concerns, or complaints.   Cancer Staging Squamous cell lung cancer, left [facility]) Staging form: Lung, AJCC 8th Edition - Clinical stage from 05/16/2017: Stage IIIB (cT3, cN2, cM0) - Signed by Lloyd Huger, MD on 05/16/2017   Jacquelin Hawking, NP   04/09/2021 9:43 AM

## 2021-04-09 NOTE — Progress Notes (Signed)
Pt c/o right sided neck and shoulder pain. Pt has rx of steroids. Pt states he is afraid it will flare back up again.

## 2021-04-10 ENCOUNTER — Telehealth: Payer: Self-pay

## 2021-04-10 NOTE — Telephone Encounter (Signed)
Sent PA request on [email or web address] for Lidocaine 5% patches Key: [id number] PA case ID: [id number]

## 2021-04-24 ENCOUNTER — Other Ambulatory Visit: Payer: Self-pay

## 2021-04-24 MED ORDER — TRAMADOL HCL 50 MG PO TABS: 50.0000 mg | ORAL_TABLET | Freq: Four times a day (QID) | ORAL | 1 refills | Status: DC | PRN

## 2021-06-01 ENCOUNTER — Ambulatory Visit
Admission: RE | Admit: 2021-06-01 | Discharge: 2021-06-01 | Disposition: A | Discharge status: Home / Self Care | Payer: 59 | Source: Ambulatory Visit | Attending: Oncology | Admitting: Oncology

## 2021-06-01 ENCOUNTER — Other Ambulatory Visit: Payer: Self-pay

## 2021-06-01 DIAGNOSIS — C3492 Malignant neoplasm of unspecified part of left bronchus or lung: Secondary | ICD-10-CM | POA: Diagnosis not present

## 2021-06-01 LAB — POCT I-STAT CREATININE: Creatinine, Ser: 0.8 mg/dL (ref 0.61–1.24)

## 2021-06-01 MED ORDER — IOHEXOL 300 MG/ML  SOLN
75.0000 mL | Freq: Once | INTRAMUSCULAR | Status: AC | PRN
  Administered 2021-06-01: 75 mL via INTRAVENOUS

## 2021-06-05 ENCOUNTER — Other Ambulatory Visit: Payer: Self-pay

## 2021-06-05 ENCOUNTER — Inpatient Hospital Stay: Payer: 59 | Attending: Oncology | Admitting: Oncology

## 2021-06-05 ENCOUNTER — Encounter: Payer: Self-pay | Admitting: Oncology

## 2021-06-05 ENCOUNTER — Ambulatory Visit
Admission: RE | Admit: 2021-06-05 | Discharge: 2021-06-05 | Disposition: A | Discharge status: Home / Self Care | Payer: 59 | Source: Ambulatory Visit | Attending: Radiation Oncology | Admitting: Radiation Oncology

## 2021-06-05 VITALS — BP 149/89 | HR 102 | Temp 96.8°F | Ht 68.0 in | Wt 203.4 lb

## 2021-06-05 DIAGNOSIS — M79601 Pain in right arm: Secondary | ICD-10-CM | POA: Diagnosis not present

## 2021-06-05 DIAGNOSIS — Z7984 Long term (current) use of oral hypoglycemic drugs: Secondary | ICD-10-CM | POA: Diagnosis not present

## 2021-06-05 DIAGNOSIS — C3492 Malignant neoplasm of unspecified part of left bronchus or lung: Secondary | ICD-10-CM

## 2021-06-05 DIAGNOSIS — M25511 Pain in right shoulder: Secondary | ICD-10-CM | POA: Insufficient documentation

## 2021-06-05 DIAGNOSIS — C3482 Malignant neoplasm of overlapping sites of left bronchus and lung: Secondary | ICD-10-CM | POA: Diagnosis present

## 2021-06-05 DIAGNOSIS — Z923 Personal history of irradiation: Secondary | ICD-10-CM | POA: Insufficient documentation

## 2021-06-05 DIAGNOSIS — F17210 Nicotine dependence, cigarettes, uncomplicated: Secondary | ICD-10-CM | POA: Insufficient documentation

## 2021-06-05 DIAGNOSIS — R918 Other nonspecific abnormal finding of lung field: Secondary | ICD-10-CM | POA: Insufficient documentation

## 2021-06-05 DIAGNOSIS — Z85118 Personal history of other malignant neoplasm of bronchus and lung: Secondary | ICD-10-CM | POA: Insufficient documentation

## 2021-06-05 DIAGNOSIS — Z79899 Other long term (current) drug therapy: Secondary | ICD-10-CM | POA: Diagnosis not present

## 2021-06-05 MED ORDER — BACLOFEN 10 MG PO TABS: 10.0000 mg | ORAL_TABLET | Freq: Three times a day (TID) | ORAL | 0 refills | Status: DC

## 2021-06-05 NOTE — Progress Notes (Signed)
Radiation Oncology Follow up Note  Name: Gabriel Mendez   Date:   06/05/2021 MRN:  [id number] DOB: 24-Jul-1960    This 60 y.o. male presents to the clinic today for 23-month follow-up status post SBRT to his right lower lobe presumed stage I non-small cell lung cancer.  REFERRING PROVIDER: Lynnell Jude, MD  HPI: Patient is a 60 year old male now out 4 months having completed SBRT to his right lower lobe for presumed stage I non-small cell lung cancer.  Clinically he is doing well specifically denies cough hemoptysis or chest tightness is having some cervical pain although plain films of his cervical spine do show.  Multilevel degenerative disc disease moderate right-sided around neuroforaminal stenosis is noted at multiple levels secondary to spurring.  He also had a CT scan of the chest which I have reviewed showing changes consistent with radiation evolution of the right lower lobe.  There has been noted left upper lobe pulmonary nodules which have substantially increased in size indicative of progression of disease.  COMPLICATIONS OF TREATMENT: none  FOLLOW UP COMPLIANCE: keeps appointments   PHYSICAL EXAM:  There were no vitals taken for this visit. Well-developed well-nourished patient in NAD. HEENT reveals PERLA, EOMI, discs not visualized.  Oral cavity is clear. No oral mucosal lesions are identified. Neck is clear without evidence of cervical or supraclavicular adenopathy. Lungs are clear to A&P. Cardiac examination is essentially unremarkable with regular rate and rhythm without murmur rub or thrill. Abdomen is benign with no organomegaly or masses noted. Motor sensory and DTR levels are equal and symmetric in the upper and lower extremities. Cranial nerves II through XII are grossly intact. Proprioception is intact. No peripheral adenopathy or edema is identified. No motor or sensory levels are noted. Crude visual fields are within normal range.  RADIOLOGY RESULTS: CT scans of the chest  as well as cervical spine films reviewed  PLAN: At this time patient will be seeing medical oncology.  I would advocate for further systemic treatment either chemotherapy or combination with immunotherapy.  I would use his progressive left upper lobe nodules as a marker for treatment response.  Should those nodules continue to progress would offer some palliative radiation therapy to them.  I have asked to see the patient back in 3 to 4 months.  He continues close follow-up care with medical oncology.  I would like to take this opportunity to thank you for allowing me to participate in the care of your patient.[name], MD

## 2021-06-05 NOTE — Progress Notes (Signed)
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  INTERVAL HISTORY: Gabriel Mendez is a 60 year old male with past medical history significant for left lung cancer status post systemic treatment and XRT with recent recurrent disease treated with SBRT to right lower lobe.   He presents today for follow-up and to review most recent CT scan.  States he is doing fair.  Continues to have right arm pain.  Has chronic shortness of breath and cough.  Denies any recent fevers.  He has been taking ibuprofen 400 to 600 mg every 6-8 hours with some relief.  States it is tolerable.  REVIEW OF SYSTEMS:   Review of Systems  Constitutional:  Positive for malaise/fatigue. Negative for chills, fever and weight loss.  HENT:  Negative for congestion, ear pain and tinnitus.   Eyes: Negative.  Negative for blurred vision and double vision.  Respiratory: Negative.  Negative for cough, sputum production and shortness of breath.   Cardiovascular: Negative.  Negative for chest pain, palpitations and leg swelling.  Gastrointestinal: Negative.  Negative for abdominal pain, constipation, diarrhea, nausea and vomiting.  Genitourinary:  Negative for dysuria, frequency and urgency.  Musculoskeletal:  Positive for myalgias (Right arm). Negative for back pain and falls.  Skin: Negative.  Negative for rash.  Neurological: Negative.  Negative for weakness and headaches.  Endo/Heme/Allergies: Negative.  Does not bruise/bleed easily.  Psychiatric/Behavioral: Negative.  Negative for  depression. The patient is not nervous/anxious and does not have insomnia.    As per HPI. Otherwise, a complete review of systems is negative.  PAST MEDICAL HISTORY: Past Medical History:  Diagnosis Date   Asthma    COPD (chronic obstructive pulmonary disease) ([place])    Diabetes mellitus without complication ([place])    Hypertension    Lung cancer ([place]) 05/2017   Hx Chemo + rad tx's.   Pneumonia     PAST SURGICAL HISTORY: Past Surgical History:  Procedure Laterality Date   ENDOBRONCHIAL ULTRASOUND N/A 05/12/2017   Procedure: ENDOBRONCHIAL ULTRASOUND;  Surgeon: Laverle Hobby, MD;  Location: [facility] ORS;  Service: Pulmonary;  Laterality: N/A;   FRACTURE SURGERY Left    ANKLE X 2   TONSILLECTOMY      FAMILY HISTORY: Family History  Problem Relation Age of Onset   Stroke Mother    Diabetes Mother    Hypertension Mother    Diabetes Father    Hypertension Father    Diabetes Sister    Diabetes Brother    Heart attack Paternal Uncle    Stroke Maternal Grandmother     ADVANCED DIRECTIVES (Y/N):  N  HEALTH MAINTENANCE: Social History   Tobacco Use   Smoking status: Some Days    Packs/day: 0.50    Types: Cigarettes   Smokeless tobacco: Never  Vaping Use   Vaping Use: Never used  Substance Use Topics   Alcohol use: No   Drug use: No     Colonoscopy:  PAP:  Bone density:  Lipid panel:  Allergies  Allergen Reactions   Shrimp [Shellfish Allergy] Nausea And Vomiting    Current Outpatient Medications  Medication Sig Dispense Refill  albuterol (PROVENTIL) (2.5 MG/3ML) 0.083% nebulizer solution Take 2.5 mg by nebulization every 6 (six) hours as needed for wheezing or shortness of breath.     albuterol (VENTOLIN HFA) 108 (90 Base) MCG/ACT inhaler Inhale 2 puffs into the lungs every 6 (six) hours as needed for wheezing or shortness of breath. 1 Inhaler 0   ALPRAZolam (XANAX) 0.5 MG tablet Take 1 tablet (0.5 mg total) by mouth as needed for anxiety (Before radiation  treatments). (Patient not taking: No sig reported) 10 tablet 0   amLODipine (NORVASC) 5 MG tablet Take 5 mg by mouth daily.     ANORO ELLIPTA 62.5-25 MCG/INH AEPB INHALE 1 PUFF INTO THE LUNGS DAILY (Patient not taking: No sig reported) 60 each 2   atorvastatin (LIPITOR) 20 MG tablet Take 20 mg by mouth daily.     glipiZIDE (GLUCOTROL) 5 MG tablet Take 10 mg by mouth daily before breakfast.      ibuprofen (ADVIL,MOTRIN) 200 MG tablet Take 400-800 mg every 8 (eight) hours as needed by mouth (for pain.).     lansoprazole (PREVACID) 30 MG capsule Take 1 capsule (30 mg total) by mouth daily at 12 noon. 30 capsule 0   lidocaine (LIDODERM) 5 % Place 1 patch onto the skin daily. Remove & Discard patch within 12 hours or as directed by MD 30 patch 0   lisinopril-hydrochlorothiazide (ZESTORETIC) 20-25 MG tablet Take 1 tablet by mouth daily. 30 tablet 0   metFORMIN (GLUCOPHAGE) 1000 MG tablet Take 1 tablet (1,000 mg total) by mouth 2 (two) times daily. 60 tablet 0   omeprazole (PRILOSEC) 20 MG capsule TAKE 1 CAPSULE(20 MG) BY MOUTH DAILY (Patient not taking: No sig reported) 30 capsule 2   traMADol (ULTRAM) 50 MG tablet Take 1-2 tablets (50-100 mg total) by mouth every 6 (six) hours as needed. 60 tablet 1   TRELEGY ELLIPTA 200-62.5-25 MCG/INH AEPB Take 1 puff by mouth daily.     No current facility-administered medications for this visit.    OBJECTIVE: There were no vitals filed for this visit.    There is no height or weight on file to calculate BMI.    ECOG FS:0 - Asymptomatic  Physical Exam Constitutional:      Appearance: Normal appearance.  HENT:     Head: Normocephalic and atraumatic.  Eyes:     Pupils: Pupils are equal, round, and reactive to light.  Cardiovascular:     Rate and Rhythm: Normal rate and regular rhythm.     Heart sounds: Normal heart sounds. No murmur heard. Pulmonary:     Effort: Pulmonary effort is normal.     Breath sounds: Normal breath sounds. No wheezing.   Abdominal:     General: Bowel sounds are normal. There is no distension.     Palpations: Abdomen is soft.     Tenderness: There is no abdominal tenderness.  Musculoskeletal:     Right shoulder: Tenderness present. Decreased range of motion.     Cervical back: Normal range of motion.  Skin:    General: Skin is warm and dry.     Findings: No rash.  Neurological:     Mental Status: He is alert and oriented to person, place, and time.  Psychiatric:        Judgment: Judgment normal.     LAB RESULTS:  Lab Results  Component Value Date   NA 129 (L) 11/12/2019   K 3.8 11/12/2019   CL 89 (L) 11/12/2019   CO2 27 11/12/2019  GLUCOSE 339 (H) 11/12/2019   BUN 18 11/12/2019   CREATININE 0.80 06/01/2021   CALCIUM 8.9 11/12/2019   PROT 6.9 11/12/2019   ALBUMIN 3.9 11/12/2019   AST 17 11/12/2019   ALT 23 11/12/2019   ALKPHOS 96 11/12/2019   BILITOT 0.6 11/12/2019   GFRNONAA >60 11/12/2019   GFRAA >60 11/12/2019    Lab Results  Component Value Date   WBC 7.7 11/12/2019   NEUTROABS 6.0 11/12/2019   HGB 15.3 11/12/2019   HCT 44.1 11/12/2019   MCV 85.1 11/12/2019   PLT 239 11/12/2019     STUDIES: CT CHEST W CONTRAST  Result Date: 06/02/2021 CLINICAL DATA:  60 year old male with history of non-small cell lung cancer. Follow-up study. EXAM: CT CHEST WITH CONTRAST TECHNIQUE: Multidetector CT imaging of the chest was performed during intravenous contrast administration. CONTRAST:  26m OMNIPAQUE IOHEXOL 300 MG/ML  SOLN COMPARISON:  Chest CT 01/29/2021. FINDINGS: Cardiovascular: Heart size is normal. There is no significant pericardial fluid, thickening or pericardial calcification. There is aortic atherosclerosis, as well as atherosclerosis of the great vessels of the mediastinum and the coronary arteries, including calcified atherosclerotic plaque in the left main, left anterior descending, left circumflex and right coronary arteries. Thickening and calcification of the aortic valve.  Mediastinum/Nodes: No pathologically enlarged mediastinal or hilar lymph nodes. Esophagus is unremarkable in appearance. No axillary lymphadenopathy. Lungs/Pleura: Chronic area of postradiation mass-like fibrosis centered in the right upper lobe, similar to prior examinations. Previously noted macrolobulated right lower lobe mass has substantially decreased in size, currently a cavitary thick-walled nodule measuring 1.8 x 1.3 cm. However, the previously noted apical left upper lobe pulmonary nodules have substantially increased in size, currently measuring 2.6 x 1.9 cm (axial image 28 of series 3) as compared with 1.6 cm on the prior examination, and a thick-walled cavitary mass currently measuring 3.2 x 2.2 cm (axial image 33 of series 3) as compared with 2.3 cm on the prior examination. Additionally, there is increasing ground-glass attenuation which is somewhat nodular in appearance inferior to the left apical cavitary mass, currently measuring 1.6 x 1.1 cm (axial image 39 of series 3). No pleural effusions. Mild centrilobular and paraseptal emphysema. Upper Abdomen: Multiple adrenal nodules bilaterally, similar to prior examinations, measuring up to 2.1 x 2.1 cm on the left (axial image 168 of series 2). Aortic atherosclerosis. Musculoskeletal: There are no aggressive appearing lytic or blastic lesions noted in the visualized portions of the skeleton. IMPRESSION: 1. There has been some involution of the previously noted right lower lobe pulmonary nodule, however, the previously noted left upper lobe pulmonary nodules have both substantially increased in size, as above, and there is a new area of nodular ground-glass attenuation in the left upper lobe as well. Overall, findings are indicative of marked progression of disease in the left upper lobe. 2. Stable chronic postradiation mass-like fibrosis in the perihilar aspect of the right upper lobe. 3. Mild centrilobular and paraseptal emphysema. 4. Aortic  atherosclerosis, in addition to left main and 3 vessel coronary artery disease. Please note that although the presence of coronary artery calcium documents the presence of coronary artery disease, the severity of this disease and any potential stenosis cannot be assessed on this non-gated CT examination. Assessment for potential risk factor modification, dietary therapy or pharmacologic therapy may be warranted, if clinically indicated. 5. Hepatic steatosis. 6. Stable adrenal nodules bilaterally, previously characterized as adenomas. Aortic Atherosclerosis (ICD10-I70.0) and Emphysema (ICD10-J43.9). Electronically Signed   By: DMauri BrooklynD.  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  PLAN:    1.  1.  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  Imaging from 06/01/2021 showed improvement of right lower lobe pulmonary nodule however previously noted left upper lobe pulmonary nodules have both substantially increased in size and there is a new area of nodular groundglass attenuation in right upper lobe as well.  Overall findings are indicative of marked progression of disease in left upper lobe.  Dr. Grayland Ormond has mentioned in previous notes the possibility to reinitiate systemic chemotherapy if progression is noted.  We will have him follow-up with Dr. Grayland Ormond in approximately 2 weeks to discuss treatment plans given progression on CT scan.    2. Right shoulder and arm pain-unclear etiology.  Question disease progression.  Continue lidocaine patch and tramadol 50-100 mg every 6-8 hours as needed.  Could trial a muscle relaxer to see if that would be helpful given improvement on steroids.  3.   Cough-chronic.  Disposition: Patient has visit with Dr. Donella Stade this afternoon. Return to clinic for a virtual visit with Dr. Grayland Ormond in approximately 2 weeks to discuss plan moving forward which will likely include systemic chemotherapy.  I spent 25 minutes dedicated to the care of this patient (face-to-face and non-face-to-face) on the date of the encounter to include what is described in the assessment and plan.  Patient expressed understanding and was in agreement with this plan. He also understands that He can call clinic at any time with any questions, concerns, or complaints.    Cancer Staging  Squamous cell lung cancer, left [facility]) Staging form: Lung, AJCC 8th Edition - Clinical stage from 05/16/2017: Stage IIIB (cT3, cN2, cM0) - Signed by Lloyd Huger, MD on 05/16/2017   Jacquelin Hawking, NP   06/05/2021 8:30 AM

## 2021-06-14 NOTE — Progress Notes (Signed)
[place]  [phone] [phone]  ID: Gabriel Mendez OB: March 13, 1961  MR#: [id number]  AOZ#:[id number]  Patient Care Team: Lynnell Jude, MD as PCP - General (Family Medicine) Lloyd Huger, MD as Consulting Physician (Oncology) [name], MD as Referring Physician (Radiation Oncology) Telford Nab, RN as Registered Nurse  I connected with Gabriel Mendez on 06/19/21 at  3:30 PM EST by telephone visit and verified that I am speaking with the correct person using two identifiers.   I discussed the limitations, risks, security and privacy concerns of performing an evaluation and management service by telemedicine and the availability of in-person appointments. I also discussed with the patient that there may be a patient responsible charge related to this service. The patient expressed understanding and agreed to proceed.   Other persons participating in the visit and their role in the encounter: Patient, MD.  Patients location: Home. Providers location: Clinic.  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  INTERVAL HISTORY: Patient returns to clinic today for further evaluation and further discussion of his most recent CT scan results.  He currently feels well and is asymptomatic. He continues to be active and work full-time. He has no neurologic complaints. He has a good appetite and denies weight loss.  He denies any other pain.  He denies any chest pain, cough, hemoptysis, or shortness of breath.  He has no nausea, vomiting, constipation, or diarrhea.  He has no urinary complaints.  Patient offers no specific complaints today.  REVIEW OF SYSTEMS:   Review of Systems  Constitutional: Negative.  Negative for fever, malaise/fatigue and weight loss.  HENT: Negative.  Negative for congestion.   Respiratory: Negative.  Negative for cough, hemoptysis and shortness of  breath.   Cardiovascular: Negative.  Negative for chest pain and leg swelling.  Gastrointestinal: Negative.  Negative for abdominal pain and heartburn.  Genitourinary: Negative.  Negative for dysuria.  Musculoskeletal: Negative.  Negative for back pain and joint pain.  Skin: Negative.  Negative for rash.  Neurological: Negative.  Negative for dizziness, sensory change, focal weakness, weakness and headaches.  Psychiatric/Behavioral: Negative.  The patient is not nervous/anxious.    As per HPI. Otherwise, a complete review of systems is negative.  PAST MEDICAL HISTORY: Past Medical History:  Diagnosis Date   Asthma    COPD (chronic obstructive pulmonary disease) ([place])    Diabetes mellitus without complication ([place])    Hypertension    Lung cancer ([place]) 05/2017   Hx Chemo + rad tx's.   Pneumonia     PAST SURGICAL HISTORY: Past Surgical History:  Procedure Laterality Date   ENDOBRONCHIAL ULTRASOUND N/A 05/12/2017   Procedure: ENDOBRONCHIAL ULTRASOUND;  Surgeon: Laverle Hobby, MD;  Location: [facility] ORS;  Service: Pulmonary;  Laterality: N/A;   FRACTURE SURGERY Left    ANKLE X 2   TONSILLECTOMY      FAMILY HISTORY: Family History  Problem Relation Age of Onset   Stroke Mother    Diabetes Mother    Hypertension Mother    Diabetes Father    Hypertension Father    Diabetes Sister    Diabetes Brother    Heart attack Paternal Uncle    Stroke Maternal Grandmother     ADVANCED DIRECTIVES (Y/N):  N  HEALTH MAINTENANCE: Social History   Tobacco Use   Smoking status: Some Days    Packs/day: 0.50    Types: Cigarettes   Smokeless  tobacco: Never  Vaping Use   Vaping Use: Never used  Substance Use Topics   Alcohol use: No   Drug use: No     Colonoscopy:  PAP:  Bone density:  Lipid panel:  Allergies  Allergen Reactions   Shrimp [Shellfish Allergy] Nausea And Vomiting    Current Outpatient Medications  Medication Sig Dispense Refill   albuterol (PROVENTIL)  (2.5 MG/3ML) 0.083% nebulizer solution Take 2.5 mg by nebulization every 6 (six) hours as needed for wheezing or shortness of breath.     albuterol (VENTOLIN HFA) 108 (90 Base) MCG/ACT inhaler Inhale 2 puffs into the lungs every 6 (six) hours as needed for wheezing or shortness of breath. 1 Inhaler 0   amLODipine (NORVASC) 5 MG tablet Take 10 mg by mouth daily.     ANORO ELLIPTA 62.5-25 MCG/INH AEPB INHALE 1 PUFF INTO THE LUNGS DAILY 60 each 2   atorvastatin (LIPITOR) 20 MG tablet Take 20 mg by mouth daily.     baclofen (LIORESAL) 10 MG tablet Take 1 tablet (10 mg total) by mouth 3 (three) times daily. 30 each 0   glipiZIDE (GLUCOTROL) 5 MG tablet Take 10 mg by mouth daily before breakfast.      ibuprofen (ADVIL,MOTRIN) 200 MG tablet Take 400-800 mg every 8 (eight) hours as needed by mouth (for pain.).     lisinopril-hydrochlorothiazide (ZESTORETIC) 20-25 MG tablet Take 1 tablet by mouth daily. 30 tablet 0   metFORMIN (GLUCOPHAGE) 1000 MG tablet Take 1 tablet (1,000 mg total) by mouth 2 (two) times daily. 60 tablet 0   traMADol (ULTRAM) 50 MG tablet Take 1-2 tablets (50-100 mg total) by mouth every 6 (six) hours as needed. 60 tablet 1   TRELEGY ELLIPTA 200-62.5-25 MCG/INH AEPB Take 1 puff by mouth daily.     ALPRAZolam (XANAX) 0.5 MG tablet Take 1 tablet (0.5 mg total) by mouth as needed for anxiety (Before radiation treatments). (Patient not taking: Reported on 02/28/2021) 10 tablet 0   lidocaine (LIDODERM) 5 % Place 1 patch onto the skin daily. Remove & Discard patch within 12 hours or as directed by MD (Patient not taking: Reported on 06/05/2021) 30 patch 0   omeprazole (PRILOSEC) 20 MG capsule TAKE 1 CAPSULE(20 MG) BY MOUTH DAILY (Patient not taking: Reported on 02/28/2021) 30 capsule 2   No current facility-administered medications for this visit.    OBJECTIVE: There were no vitals filed for this visit.    There is no height or weight on file to calculate BMI.    ECOG FS:0 -  Asymptomatic  LAB RESULTS:  Lab Results  Component Value Date   NA 129 (L) 11/12/2019   K 3.8 11/12/2019   CL 89 (L) 11/12/2019   CO2 27 11/12/2019   GLUCOSE 339 (H) 11/12/2019   BUN 18 11/12/2019   CREATININE 0.80 06/01/2021   CALCIUM 8.9 11/12/2019   PROT 6.9 11/12/2019   ALBUMIN 3.9 11/12/2019   AST 17 11/12/2019   ALT 23 11/12/2019   ALKPHOS 96 11/12/2019   BILITOT 0.6 11/12/2019   GFRNONAA >60 11/12/2019   GFRAA >60 11/12/2019    Lab Results  Component Value Date   WBC 7.7 11/12/2019   NEUTROABS 6.0 11/12/2019   HGB 15.3 11/12/2019   HCT 44.1 11/12/2019   MCV 85.1 11/12/2019   PLT 239 11/12/2019     STUDIES: CT CHEST W CONTRAST  Result Date: 06/02/2021 CLINICAL DATA:  60 year old male with history of non-small cell lung cancer. Follow-up study. EXAM: CT  CHEST WITH CONTRAST TECHNIQUE: Multidetector CT imaging of the chest was performed during intravenous contrast administration. CONTRAST:  17m OMNIPAQUE IOHEXOL 300 MG/ML  SOLN COMPARISON:  Chest CT 01/29/2021. FINDINGS: Cardiovascular: Heart size is normal. There is no significant pericardial fluid, thickening or pericardial calcification. There is aortic atherosclerosis, as well as atherosclerosis of the great vessels of the mediastinum and the coronary arteries, including calcified atherosclerotic plaque in the left main, left anterior descending, left circumflex and right coronary arteries. Thickening and calcification of the aortic valve. Mediastinum/Nodes: No pathologically enlarged mediastinal or hilar lymph nodes. Esophagus is unremarkable in appearance. No axillary lymphadenopathy. Lungs/Pleura: Chronic area of postradiation mass-like fibrosis centered in the right upper lobe, similar to prior examinations. Previously noted macrolobulated right lower lobe mass has substantially decreased in size, currently a cavitary thick-walled nodule measuring 1.8 x 1.3 cm. However, the previously noted apical left upper lobe  pulmonary nodules have substantially increased in size, currently measuring 2.6 x 1.9 cm (axial image 28 of series 3) as compared with 1.6 cm on the prior examination, and a thick-walled cavitary mass currently measuring 3.2 x 2.2 cm (axial image 33 of series 3) as compared with 2.3 cm on the prior examination. Additionally, there is increasing ground-glass attenuation which is somewhat nodular in appearance inferior to the left apical cavitary mass, currently measuring 1.6 x 1.1 cm (axial image 39 of series 3). No pleural effusions. Mild centrilobular and paraseptal emphysema. Upper Abdomen: Multiple adrenal nodules bilaterally, similar to prior examinations, measuring up to 2.1 x 2.1 cm on the left (axial image 168 of series 2). Aortic atherosclerosis. Musculoskeletal: There are no aggressive appearing lytic or blastic lesions noted in the visualized portions of the skeleton. IMPRESSION: 1. There has been some involution of the previously noted right lower lobe pulmonary nodule, however, the previously noted left upper lobe pulmonary nodules have both substantially increased in size, as above, and there is a new area of nodular ground-glass attenuation in the left upper lobe as well. Overall, findings are indicative of marked progression of disease in the left upper lobe. 2. Stable chronic postradiation mass-like fibrosis in the perihilar aspect of the right upper lobe. 3. Mild centrilobular and paraseptal emphysema. 4. Aortic atherosclerosis, in addition to left main and 3 vessel coronary artery disease. Please note that although the presence of coronary artery calcium documents the presence of coronary artery disease, the severity of this disease and any potential stenosis cannot be assessed on this non-gated CT examination. Assessment for potential risk factor modification, dietary therapy or pharmacologic therapy may be warranted, if clinically indicated. 5. Hepatic steatosis. 6. Stable adrenal nodules  bilaterally, previously characterized as adenomas. Aortic Atherosclerosis (ICD10-I70.0) and Emphysema (ICD10-J43.9). Electronically Signed   By: DVinnie LangtonM.D.   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  PLAN:    1.  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  He subsequently completed 1 year of maintenance durvalumab on August 27, 2018. CT scan on September 08, 2019 revealed progressive lymphadenopathy in his right supraclavicular and right thoracic inlet.  PET scan completed on September 27, 2019 confirmed likely recurrence.  Biopsy on October 04, 2019 consistent with squamous cell carcinoma. He completed XRT only for his local recurrence on November 17, 2019.  Repeat CT scan on  August 28, 2020 reviewed independently with continued progression of 3 pulmonary nodules in his left and right upper lobe as well as right lower lobe.  PET scan results completed on September 18, 2020 revealed a right lower lobe nodule has a minimally elevated SUV of 2.9, the right upper lobe nodule has an SUV of 7.7 and additional 11 mm nodule in the left apex as an SUV of 5.3.  Patient has now completed XRT to these lesions.  Repeat CT scan on June 01, 2021 reviewed independently and report as above with now new progression of disease in his left lung.  We will get PET scan to further evaluate.  Patient will return to clinic 1 to 2 days after his PET scan to discuss the results and treatment planning.   2.  Hypertension: Chronic and unchanged.  Continue evaluation and treatment per primary care. 3.  Shortness of breath/cough: Patient does not complain of this today.  4.  Hyperglycemia: Continue evaluation and treatment per primary care. 5.  Back pain: Patient does not complain of this today.  I  provided 20 minutes of non face-to-face telephone visit time during this encounter which included chart review, counseling, and coordination of care as documented above.   Patient expressed understanding and was in agreement with this plan. He also understands that He can call clinic at any time with any questions, concerns, or complaints.    Cancer Staging  Squamous cell lung cancer, left [facility]) Staging form: Lung, AJCC 8th Edition - Clinical stage from 05/16/2017: Stage IIIB (cT3, cN2, cM0) - Signed by Lloyd Huger, MD on 05/16/2017   Lloyd Huger, MD   06/19/2021 8:03 PM

## 2021-06-19 ENCOUNTER — Inpatient Hospital Stay: Payer: 59 | Attending: Oncology | Admitting: Oncology

## 2021-06-19 DIAGNOSIS — Z923 Personal history of irradiation: Secondary | ICD-10-CM | POA: Insufficient documentation

## 2021-06-19 DIAGNOSIS — Z7984 Long term (current) use of oral hypoglycemic drugs: Secondary | ICD-10-CM | POA: Insufficient documentation

## 2021-06-19 DIAGNOSIS — F17210 Nicotine dependence, cigarettes, uncomplicated: Secondary | ICD-10-CM | POA: Insufficient documentation

## 2021-06-19 DIAGNOSIS — C7802 Secondary malignant neoplasm of left lung: Secondary | ICD-10-CM | POA: Insufficient documentation

## 2021-06-19 DIAGNOSIS — R059 Cough, unspecified: Secondary | ICD-10-CM | POA: Insufficient documentation

## 2021-06-19 DIAGNOSIS — C3492 Malignant neoplasm of unspecified part of left bronchus or lung: Secondary | ICD-10-CM

## 2021-06-19 DIAGNOSIS — I10 Essential (primary) hypertension: Secondary | ICD-10-CM | POA: Insufficient documentation

## 2021-06-19 DIAGNOSIS — E1165 Type 2 diabetes mellitus with hyperglycemia: Secondary | ICD-10-CM | POA: Insufficient documentation

## 2021-06-19 DIAGNOSIS — G8929 Other chronic pain: Secondary | ICD-10-CM | POA: Insufficient documentation

## 2021-06-19 DIAGNOSIS — J432 Centrilobular emphysema: Secondary | ICD-10-CM | POA: Insufficient documentation

## 2021-06-19 DIAGNOSIS — C3412 Malignant neoplasm of upper lobe, left bronchus or lung: Secondary | ICD-10-CM | POA: Insufficient documentation

## 2021-06-19 DIAGNOSIS — R350 Frequency of micturition: Secondary | ICD-10-CM | POA: Insufficient documentation

## 2021-06-19 DIAGNOSIS — Z79899 Other long term (current) drug therapy: Secondary | ICD-10-CM | POA: Insufficient documentation

## 2021-06-19 DIAGNOSIS — Z5111 Encounter for antineoplastic chemotherapy: Secondary | ICD-10-CM | POA: Insufficient documentation

## 2021-06-20 ENCOUNTER — Other Ambulatory Visit: Payer: Self-pay | Admitting: Emergency Medicine

## 2021-06-20 DIAGNOSIS — C3490 Malignant neoplasm of unspecified part of unspecified bronchus or lung: Secondary | ICD-10-CM

## 2021-06-20 DIAGNOSIS — C3492 Malignant neoplasm of unspecified part of left bronchus or lung: Secondary | ICD-10-CM

## 2021-06-20 NOTE — Addendum Note (Signed)
Addended by: Wilford Corner on: 06/20/2021 08:34 AM   Modules accepted: Orders

## 2021-07-04 ENCOUNTER — Ambulatory Visit
Admission: RE | Admit: 2021-07-04 | Discharge: 2021-07-04 | Disposition: A | Discharge status: Home / Self Care | Payer: 59 | Source: Ambulatory Visit | Attending: Oncology | Admitting: Oncology

## 2021-07-04 ENCOUNTER — Other Ambulatory Visit: Payer: Self-pay

## 2021-07-04 DIAGNOSIS — C3492 Malignant neoplasm of unspecified part of left bronchus or lung: Secondary | ICD-10-CM

## 2021-07-05 ENCOUNTER — Ambulatory Visit
Admission: RE | Admit: 2021-07-05 | Discharge: 2021-07-05 | Disposition: A | Discharge status: Home / Self Care | Payer: 59 | Source: Ambulatory Visit | Attending: Oncology | Admitting: Oncology

## 2021-07-05 DIAGNOSIS — J328 Other chronic sinusitis: Secondary | ICD-10-CM | POA: Diagnosis not present

## 2021-07-05 DIAGNOSIS — I2510 Atherosclerotic heart disease of native coronary artery without angina pectoris: Secondary | ICD-10-CM | POA: Insufficient documentation

## 2021-07-05 DIAGNOSIS — D3501 Benign neoplasm of right adrenal gland: Secondary | ICD-10-CM | POA: Insufficient documentation

## 2021-07-05 DIAGNOSIS — K449 Diaphragmatic hernia without obstruction or gangrene: Secondary | ICD-10-CM | POA: Diagnosis not present

## 2021-07-05 DIAGNOSIS — D3502 Benign neoplasm of left adrenal gland: Secondary | ICD-10-CM | POA: Diagnosis not present

## 2021-07-05 DIAGNOSIS — C3492 Malignant neoplasm of unspecified part of left bronchus or lung: Secondary | ICD-10-CM | POA: Insufficient documentation

## 2021-07-05 DIAGNOSIS — I700 Atherosclerosis of aorta: Secondary | ICD-10-CM | POA: Diagnosis not present

## 2021-07-05 DIAGNOSIS — J439 Emphysema, unspecified: Secondary | ICD-10-CM | POA: Diagnosis not present

## 2021-07-05 DIAGNOSIS — R918 Other nonspecific abnormal finding of lung field: Secondary | ICD-10-CM | POA: Insufficient documentation

## 2021-07-05 DIAGNOSIS — N281 Cyst of kidney, acquired: Secondary | ICD-10-CM | POA: Diagnosis not present

## 2021-07-05 DIAGNOSIS — N400 Enlarged prostate without lower urinary tract symptoms: Secondary | ICD-10-CM | POA: Diagnosis not present

## 2021-07-05 LAB — GLUCOSE, CAPILLARY: Glucose-Capillary: 183 mg/dL — ABNORMAL HIGH (ref 70–99)

## 2021-07-05 MED ORDER — FLUDEOXYGLUCOSE F - 18 (FDG) INJECTION
9.7000 | Freq: Once | INTRAVENOUS | Status: AC | PRN
Start: 2021-07-05 — End: 2021-07-05
  Administered 2021-07-05: 10.83 via INTRAVENOUS

## 2021-07-05 NOTE — Progress Notes (Signed)
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  INTERVAL HISTORY: Patient returns to clinic today for further evaluation, discussion of his PET scan results, and treatment planning.  He continues to have chronic right shoulder pain as well as chronic cough.  Despite this, he continues to be active and work full-time. He has no neurologic complaints. He has a good appetite and denies weight loss.  He denies any other pain.  He denies any chest pain, hemoptysis, or shortness of breath.  He has no nausea, vomiting, constipation, or diarrhea.  He has no urinary complaints.  Patient offers no further specific complaints today.  REVIEW OF SYSTEMS:   Review of Systems  Constitutional: Negative.  Negative for fever, malaise/fatigue and weight loss.  HENT: Negative.  Negative for congestion.   Respiratory:  Positive for cough. Negative for hemoptysis and shortness of breath.   Cardiovascular: Negative.  Negative for chest pain and leg swelling.  Gastrointestinal: Negative.  Negative for abdominal pain and heartburn.  Genitourinary: Negative.  Negative for dysuria.  Musculoskeletal: Negative.  Negative for back pain and joint pain.  Skin: Negative.  Negative for rash.  Neurological: Negative.  Negative for dizziness, sensory change, focal weakness, weakness and headaches.  Psychiatric/Behavioral: Negative.  The patient is not nervous/anxious.    As per HPI. Otherwise, a complete review of systems is negative.  PAST MEDICAL HISTORY: Past Medical History:   Diagnosis Date   Asthma    COPD (chronic obstructive pulmonary disease) ([place])    Diabetes mellitus without complication ([place])    Hypertension    Lung cancer ([place]) 05/2017   Hx Chemo + rad tx's.   Pneumonia     PAST SURGICAL HISTORY: Past Surgical History:  Procedure Laterality Date   ENDOBRONCHIAL ULTRASOUND N/A 05/12/2017   Procedure: ENDOBRONCHIAL ULTRASOUND;  Surgeon: Laverle Hobby, MD;  Location: [facility] ORS;  Service: Pulmonary;  Laterality: N/A;   FRACTURE SURGERY Left    ANKLE X 2   TONSILLECTOMY      FAMILY HISTORY: Family History  Problem Relation Age of Onset   Stroke Mother    Diabetes Mother    Hypertension Mother    Diabetes Father    Hypertension Father    Diabetes Sister    Diabetes Brother    Heart attack Paternal Uncle    Stroke Maternal Grandmother     ADVANCED DIRECTIVES (Y/N):  N  HEALTH MAINTENANCE: Social History   Tobacco Use   Smoking status: Some Days    Packs/day: 0.50    Types: Cigarettes   Smokeless tobacco: Never  Vaping Use   Vaping Use: Never used  Substance Use Topics   Alcohol use: No   Drug use: No     Colonoscopy:  PAP:  Bone density:  Lipid panel:  Allergies  Allergen Reactions   Shrimp [Shellfish Allergy] Nausea And Vomiting    Current Outpatient Medications  Medication Sig Dispense Refill   albuterol (PROVENTIL) (2.5 MG/3ML) 0.083% nebulizer solution Take 2.5 mg by nebulization every 6 (six) hours as needed for wheezing or shortness of breath.     albuterol (VENTOLIN HFA)  108 (90 Base) MCG/ACT inhaler Inhale 2 puffs into the lungs every 6 (six) hours as needed for wheezing or shortness of breath. 1 Inhaler 0   amLODipine (NORVASC) 5 MG tablet Take 10 mg by mouth daily.     ANORO ELLIPTA 62.5-25 MCG/INH AEPB INHALE 1 PUFF INTO THE LUNGS DAILY 60 each 2   atorvastatin (LIPITOR) 20 MG tablet Take 20 mg by mouth daily.     baclofen (LIORESAL) 10 MG tablet Take 1 tablet (10 mg total) by mouth 3 (three) times  daily. 30 each 0   glipiZIDE (GLUCOTROL) 5 MG tablet Take 10 mg by mouth daily before breakfast.      ibuprofen (ADVIL,MOTRIN) 200 MG tablet Take 400-800 mg every 8 (eight) hours as needed by mouth (for pain.).     lisinopril-hydrochlorothiazide (ZESTORETIC) 20-25 MG tablet Take 1 tablet by mouth daily. 30 tablet 0   metFORMIN (GLUCOPHAGE) 1000 MG tablet Take 1 tablet (1,000 mg total) by mouth 2 (two) times daily. 60 tablet 0   traMADol (ULTRAM) 50 MG tablet Take 1-2 tablets (50-100 mg total) by mouth every 6 (six) hours as needed. 60 tablet 1   TRELEGY ELLIPTA 200-62.5-25 MCG/INH AEPB Take 1 puff by mouth daily.     ALPRAZolam (XANAX) 0.5 MG tablet Take 1 tablet (0.5 mg total) by mouth as needed for anxiety (Before radiation treatments). (Patient not taking: Reported on 02/28/2021) 10 tablet 0   lidocaine (LIDODERM) 5 % Place 1 patch onto the skin daily. Remove & Discard patch within 12 hours or as directed by MD (Patient not taking: Reported on 06/05/2021) 30 patch 0   omeprazole (PRILOSEC) 20 MG capsule TAKE 1 CAPSULE(20 MG) BY MOUTH DAILY (Patient not taking: Reported on 02/28/2021) 30 capsule 2   No current facility-administered medications for this visit.    OBJECTIVE: Vitals:   07/10/21 0858  BP: (!) 147/85  Pulse: 98  Resp: 16  Temp: (!) 96.9 F (36.1 C)  SpO2: 99%      Body mass index is 28.72 kg/m.    ECOG FS:0 - Asymptomatic  General: Well-developed, well-nourished, no acute distress. Eyes: Pink conjunctiva, anicteric sclera. HEENT: Normocephalic, moist mucous membranes. Lungs: No audible wheezing or coughing. Heart: Regular rate and rhythm. Abdomen: Soft, nontender, no obvious distention. Musculoskeletal: No edema, cyanosis, or clubbing. Neuro: Alert, answering all questions appropriately. Cranial nerves grossly intact. Skin: No rashes or petechiae noted. Psych: Normal affect.  LAB RESULTS:  Lab Results  Component Value Date   NA 129 (L) 11/12/2019   K 3.8  11/12/2019   CL 89 (L) 11/12/2019   CO2 27 11/12/2019   GLUCOSE 339 (H) 11/12/2019   BUN 18 11/12/2019   CREATININE 0.80 06/01/2021   CALCIUM 8.9 11/12/2019   PROT 6.9 11/12/2019   ALBUMIN 3.9 11/12/2019   AST 17 11/12/2019   ALT 23 11/12/2019   ALKPHOS 96 11/12/2019   BILITOT 0.6 11/12/2019   GFRNONAA >60 11/12/2019   GFRAA >60 11/12/2019    Lab Results  Component Value Date   WBC 7.7 11/12/2019   NEUTROABS 6.0 11/12/2019   HGB 15.3 11/12/2019   HCT 44.1 11/12/2019   MCV 85.1 11/12/2019   PLT 239 11/12/2019     STUDIES: NM PET Image Restag (PS) Skull Base To Thigh  Addendum Date: 07/05/2021   ADDENDUM REPORT: 07/05/2021 17:13 ADDENDUM: 5. 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 Electronically Signed   By: Ilona Sorrel M.D.   On: 07/05/2021 17:13   Result Date: 07/05/2021 CLINICAL DATA:  Subsequent treatment strategy for right upper lobe squamous cell lung cancer with history of recurrence. EXAM: NUCLEAR MEDICINE PET SKULL BASE TO THIGH TECHNIQUE: 10.8 mCi F-18 FDG was injected intravenously. Full-ring PET imaging was performed from the skull base to thigh after the radiotracer. CT data was obtained and used for attenuation correction and anatomic localization. Fasting blood glucose: 183 mg/dl COMPARISON:  09/18/2020 PET-CT.  06/01/2021 chest CT. FINDINGS: Mediastinal blood pool activity: SUV max 1.9 Liver activity: SUV max NA NECK: No hypermetabolic lymph nodes in the neck. Hypermetabolic posterior left parotid 1.4 cm soft tissue nodule with max SUV 5.1 (series 3/image 36), previously 1.4 cm with max SUV 6.2, not substantially changed. Incidental CT findings: Mucoperiosteal thickening in the bilateral ethmoidal air cells. Tiny mucous retention cyst versus polyp in inferior right maxillary sinus. CHEST: Hypermetabolic  lobulated solid and partially cavitary 4.6 x 3.0 cm anterior apical left upper lobe lung mass with max SUV 8.9 (series 3/image 87), previously separate 1.8 cm and 1.1 cm pulmonary nodules with max SUV 7.7, substantially increased in size and mildly increased in metabolism since 09/18/2020 PET-CT. No additional hypermetabolic pulmonary nodules. Irregular thick walled cavitary 1.7 cm superior segment right lower lobe pulmonary nodule demonstrates no hypermetabolism with max SUV 1.1 (series 3/image 113), decreased from 1.9 cm with previous max SUV 2.9 on 09/18/2020 PET-CT. Stable sharply marginated upper right perihilar lung consolidation with associated mild bronchiectasis, architectural distortion and volume loss, and with low level non focal FDG uptake, compatible with radiation fibrosis. No enlarged or hypermetabolic axillary, mediastinal or hilar lymph nodes. Incidental CT findings: Mild centrilobular and paraseptal emphysema. Mild patchy tree-in-bud opacities in the anterior right lower lobe are new from prior PET-CT. Coronary atherosclerosis. Atherosclerotic nonaneurysmal thoracic aorta. ABDOMEN/PELVIS: No abnormal hypermetabolic activity within the liver, pancreas, adrenal glands, or spleen. No hypermetabolic lymph nodes in the abdomen or pelvis. Incidental CT findings: Small hiatal hernia. Stable 1.4 cm right adrenal nodule with density -6 HU and stable 1.9 cm left adrenal nodule with density -2 HU, without hypermetabolism, compatible with bilateral benign adenomas. Atherosclerotic nonaneurysmal abdominal aorta. Simple 3.1 cm lateral lower left renal cyst. Mildly enlarged prostate. SKELETON: No focal hypermetabolic activity to suggest skeletal metastasis. Incidental CT findings: none IMPRESSION: 1. Hypermetabolic lobulated solid and partially cavitary 4.6 cm apical left upper lobe lung mass, substantially increased in size and mildly increased in metabolism since 09/18/2020 PET-CT, compatible with conglomerate  pulmonary metastasis. 2. No additional sites of hypermetabolic metastatic disease. Small cavitary right lower lobe pulmonary nodule is mildly decreased in size and is no longer hypermetabolic compared to 37/16/9678 PET-CT. 3. Stable right upper perihilar radiation fibrosis. Mild right lower lobe bronchiolitis. 4. Chronic findings include: Aortic Atherosclerosis (ICD10-I70.0) and Emphysema (ICD10-J43.9). Chronic paranasal sinusitis. Coronary atherosclerosis. Stable bilateral adrenal adenomas. Mild prostatomegaly. Small hiatal hernia. Electronically Signed: By: Ilona Sorrel M.D. On: 07/05/2021 16:58    ONCOLOGY HISTORY: Patient initially underwent treatment with weekly carboplatinum and Taxol along with daily XRT.  He subsequently completed 1 year of maintenance durvalumab on August 27, 2018. CT scan on September 08, 2019 revealed progressive lymphadenopathy in his right supraclavicular and right thoracic inlet.  PET scan completed on September 27, 2019 confirmed likely recurrence.  Biopsy on October 04, 2019 consistent with squamous cell carcinoma. He completed XRT only for his local recurrence on November 17, 2019.  Repeat CT scan on August 28, 2020 with continued progression of 3 pulmonary nodules in his left and right upper lobe as well as right lower lobe.  PET scan results completed on September 18, 2020 revealed a right lower lobe nodule has a minimally elevated SUV of 2.9, the right upper lobe nodule has an SUV of 7.7 and additional 11 mm nodule in the left apex as an SUV of 5.3.  Patient has now completed XRT to these lesions.   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  PLAN:    1.  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  PET scan results from July 05, 2021 reviewed independently  and report as above with 4.6 cm left upper lobe mass substantially increased in size and hypermetabolism.  No additional sites of hypermetabolic disease are noted.  Given previous XRT to this area, patient has agreed to systemic chemotherapy using Taxotere and Cyramza every 3 weeks.  Hold Udenyca for cycle 1, but this may be necessary for subsequent cycles.  Repeat PET scan after 4 cycles.  Return to clinic in 1 week for further evaluation and consideration of cycle 1.     2.  Hypertension: Chronic and unchanged.  Continue evaluation and treatment per primary care. 3.  Cough: Chronic and unchanged.  Continue OTC medications as needed.   4.  Shoulder pain: Continue tramadol as needed.  Patient declined narcotics at this time. 5   Hyperglycemia: Continue evaluation and treatment per primary care.    Patient expressed understanding and was in agreement with this plan. He also understands that He can call clinic at any time with any questions, concerns, or complaints.    Cancer Staging  Squamous cell lung cancer, left [facility]) Staging form: Lung, AJCC 8th Edition - Clinical stage from 05/16/2017: Stage IIIB (cT3, cN2, cM0) - Signed by Lloyd Huger, MD on 05/16/2017   Lloyd Huger, MD   07/10/2021 10:00 AM

## 2021-07-10 ENCOUNTER — Encounter: Payer: Self-pay | Admitting: Oncology

## 2021-07-10 ENCOUNTER — Other Ambulatory Visit: Payer: Self-pay

## 2021-07-10 ENCOUNTER — Inpatient Hospital Stay (HOSPITAL_BASED_OUTPATIENT_CLINIC_OR_DEPARTMENT_OTHER): Payer: 59 | Admitting: Oncology

## 2021-07-10 VITALS — BP 147/85 | HR 98 | Temp 96.9°F | Resp 16 | Wt 188.9 lb

## 2021-07-10 DIAGNOSIS — J432 Centrilobular emphysema: Secondary | ICD-10-CM | POA: Diagnosis not present

## 2021-07-10 DIAGNOSIS — Z5111 Encounter for antineoplastic chemotherapy: Secondary | ICD-10-CM | POA: Diagnosis not present

## 2021-07-10 DIAGNOSIS — R059 Cough, unspecified: Secondary | ICD-10-CM | POA: Diagnosis not present

## 2021-07-10 DIAGNOSIS — E1165 Type 2 diabetes mellitus with hyperglycemia: Secondary | ICD-10-CM | POA: Diagnosis not present

## 2021-07-10 DIAGNOSIS — R350 Frequency of micturition: Secondary | ICD-10-CM | POA: Diagnosis not present

## 2021-07-10 DIAGNOSIS — C7802 Secondary malignant neoplasm of left lung: Secondary | ICD-10-CM | POA: Diagnosis not present

## 2021-07-10 DIAGNOSIS — Z923 Personal history of irradiation: Secondary | ICD-10-CM | POA: Diagnosis not present

## 2021-07-10 DIAGNOSIS — F17210 Nicotine dependence, cigarettes, uncomplicated: Secondary | ICD-10-CM | POA: Diagnosis not present

## 2021-07-10 DIAGNOSIS — C3412 Malignant neoplasm of upper lobe, left bronchus or lung: Secondary | ICD-10-CM | POA: Diagnosis present

## 2021-07-10 DIAGNOSIS — Z79899 Other long term (current) drug therapy: Secondary | ICD-10-CM | POA: Diagnosis not present

## 2021-07-10 DIAGNOSIS — C3492 Malignant neoplasm of unspecified part of left bronchus or lung: Secondary | ICD-10-CM

## 2021-07-10 DIAGNOSIS — G8929 Other chronic pain: Secondary | ICD-10-CM | POA: Diagnosis not present

## 2021-07-10 DIAGNOSIS — Z7984 Long term (current) use of oral hypoglycemic drugs: Secondary | ICD-10-CM | POA: Diagnosis not present

## 2021-07-10 DIAGNOSIS — I10 Essential (primary) hypertension: Secondary | ICD-10-CM | POA: Diagnosis not present

## 2021-07-10 MED ORDER — PROCHLORPERAZINE MALEATE 10 MG PO TABS: 10.0000 mg | ORAL_TABLET | Freq: Four times a day (QID) | ORAL | 2 refills | Status: DC | PRN

## 2021-07-10 MED ORDER — ONDANSETRON HCL 8 MG PO TABS: 8.0000 mg | ORAL_TABLET | Freq: Two times a day (BID) | ORAL | 2 refills | Status: DC | PRN

## 2021-07-10 NOTE — Progress Notes (Signed)
Pain c/o pain to right neck/arm/back x several months.

## 2021-07-10 NOTE — Progress Notes (Signed)
DISCONTINUE ON PATHWAY REGIMEN - Non-Small Cell Lung     Administer weekly:     Paclitaxel      Carboplatin   **Always confirm dose/schedule in your pharmacy ordering system**  REASON: Disease Progression PRIOR TREATMENT: [id number]: Carboplatin AUC=2 + Paclitaxel 45 mg/m2 Weekly During Radiation TREATMENT RESPONSE: Partial Response (PR)  START ON PATHWAY REGIMEN - Non-Small Cell Lung     A cycle is every 21 days:     Ramucirumab      Docetaxel   **Always confirm dose/schedule in your pharmacy ordering system**  Patient Characteristics: Stage IV Metastatic, Squamous, Molecular Analysis Completed, Alteration Present and Targeted Therapy Exhausted or EGFR Exon 20 Insertion or KRAS G12C or HER2 Present, and No Prior Chemo/Immunotherapy or No Alteration Present, PS = 0, 1, Second Line -  Chemotherapy/Immunotherapy, Prior PD-1/PD-L1 Inhibitor + Chemotherapy or No Prior PD-1/PD-L1 Inhibitor, and Not a Candidate for Immunotherapy Therapeutic Status: Stage IV Metastatic Histology: Squamous Cell Molecular Analysis Results: No Alteration Present ECOG Performance Status: 0 Chemotherapy/Immunotherapy Line of Therapy: Second Line Chemotherapy/Immunotherapy Immunotherapy Candidate Status: Not a Candidate for Immunotherapy Prior Immunotherapy Status: Prior PD-1/PD-L1 Inhibitor + Chemotherapy Intent of Therapy: Non-Curative / Palliative Intent, Discussed with Patient

## 2021-07-10 NOTE — Progress Notes (Signed)
Pharmacist Chemotherapy Monitoring - Initial Assessment    Anticipated start date: 07/17/21   The following has been reviewed per standard work regarding the patient's treatment regimen: The patient's diagnosis, treatment plan and drug doses, and organ/hematologic function Lab orders and baseline tests specific to treatment regimen  The treatment plan start date, drug sequencing, and pre-medications Prior authorization status  Patient's documented medication list, including drug-drug interaction screen and prescriptions for anti-emetics and supportive care specific to the treatment regimen The drug concentrations, fluid compatibility, administration routes, and timing of the medications to be used The patient's access for treatment and lifetime cumulative dose history, if applicable  The patient's medication allergies and previous infusion related reactions, if applicable   Changes made to treatment plan:  N/A  Follow up needed:  Pending authorization for treatment    Adelina Mings, [place], 07/10/2021  1:02 PM

## 2021-07-17 ENCOUNTER — Other Ambulatory Visit: Payer: Self-pay

## 2021-07-17 ENCOUNTER — Inpatient Hospital Stay (HOSPITAL_BASED_OUTPATIENT_CLINIC_OR_DEPARTMENT_OTHER): Payer: 59 | Admitting: Oncology

## 2021-07-17 ENCOUNTER — Inpatient Hospital Stay: Payer: 59

## 2021-07-17 VITALS — BP 160/92 | HR 106 | Temp 96.4°F | Resp 18 | Wt 189.2 lb

## 2021-07-17 VITALS — BP 129/71 | HR 102 | Temp 97.2°F

## 2021-07-17 DIAGNOSIS — C3492 Malignant neoplasm of unspecified part of left bronchus or lung: Secondary | ICD-10-CM | POA: Diagnosis not present

## 2021-07-17 DIAGNOSIS — C3412 Malignant neoplasm of upper lobe, left bronchus or lung: Secondary | ICD-10-CM | POA: Diagnosis not present

## 2021-07-17 LAB — URINALYSIS, DIPSTICK ONLY
Bilirubin Urine: NEGATIVE
Glucose, UA: 250 mg/dL — AB
Ketones, ur: NEGATIVE mg/dL
Nitrite: NEGATIVE
Protein, ur: 100 mg/dL — AB
Specific Gravity, Urine: 1.01 (ref 1.005–1.030)
pH: 6 (ref 5.0–8.0)

## 2021-07-17 LAB — COMPREHENSIVE METABOLIC PANEL
ALT: 24 U/L (ref 0–44)
AST: 22 U/L (ref 15–41)
Albumin: 4.3 g/dL (ref 3.5–5.0)
Alkaline Phosphatase: 116 U/L (ref 38–126)
Anion gap: 14 (ref 5–15)
BUN: 14 mg/dL (ref 8–23)
CO2: 29 mmol/L (ref 22–32)
Calcium: 9.8 mg/dL (ref 8.9–10.3)
Chloride: 85 mmol/L — ABNORMAL LOW (ref 98–111)
Creatinine, Ser: 0.83 mg/dL (ref 0.61–1.24)
GFR, Estimated: >60 mL/min (ref >60)
Glucose, Bld: 232 mg/dL — ABNORMAL HIGH (ref 70–99)
Potassium: 3.5 mmol/L (ref 3.5–5.1)
Sodium: 128 mmol/L — ABNORMAL LOW (ref 135–145)
Total Bilirubin: 0.2 mg/dL — ABNORMAL LOW (ref 0.3–1.2)
Total Protein: 7.8 g/dL (ref 6.5–8.1)

## 2021-07-17 LAB — CBC WITH DIFFERENTIAL/PLATELET
Abs Immature Granulocytes: 0.15 10*3/uL — ABNORMAL HIGH (ref 0.00–0.07)
Basophils Absolute: 0.2 10*3/uL — ABNORMAL HIGH (ref 0.0–0.1)
Basophils Relative: 2 %
Eosinophils Absolute: 0.4 10*3/uL (ref 0.0–0.5)
Eosinophils Relative: 4 %
HCT: 47.7 % (ref 39.0–52.0)
Hemoglobin: 16.4 g/dL (ref 13.0–17.0)
Immature Granulocytes: 1 %
Lymphocytes Relative: 8 %
Lymphs Abs: 0.8 10*3/uL (ref 0.7–4.0)
MCH: 30 pg (ref 26.0–34.0)
MCHC: 34.4 g/dL (ref 30.0–36.0)
MCV: 87.2 fL (ref 80.0–100.0)
Monocytes Absolute: 0.8 10*3/uL (ref 0.1–1.0)
Monocytes Relative: 7 %
Neutro Abs: 8.5 10*3/uL — ABNORMAL HIGH (ref 1.7–7.7)
Neutrophils Relative %: 78 %
Platelets: 334 10*3/uL (ref 150–400)
RBC: 5.47 MIL/uL (ref 4.22–5.81)
RDW: 12.2 % (ref 11.5–15.5)
WBC: 10.8 10*3/uL — ABNORMAL HIGH (ref 4.0–10.5)
nRBC: 0 % (ref 0.0–0.2)

## 2021-07-17 LAB — TSH: TSH: 3.54 u[IU]/mL (ref 0.350–4.500)

## 2021-07-17 MED ORDER — SODIUM CHLORIDE 0.9 % IV SOLN
10.0000 mg | Freq: Once | INTRAVENOUS | Status: AC
  Administered 2021-07-17: 10 mg via INTRAVENOUS
  Filled 2021-07-17: qty 10

## 2021-07-17 MED ORDER — SODIUM CHLORIDE 0.9 % IV SOLN
75.0000 mg/m2 | Freq: Once | INTRAVENOUS | Status: AC
  Administered 2021-07-17: 150 mg via INTRAVENOUS
  Filled 2021-07-17: qty 15

## 2021-07-17 MED ORDER — SODIUM CHLORIDE 0.9 % IV SOLN
Freq: Once | INTRAVENOUS | Status: DC | PRN
  Filled 2021-07-17: qty 250

## 2021-07-17 MED ORDER — FAMOTIDINE IN NACL 20-0.9 MG/50ML-% IV SOLN
20.0000 mg | Freq: Once | INTRAVENOUS | Status: AC | PRN
  Administered 2021-07-17: 20 mg via INTRAVENOUS

## 2021-07-17 MED ORDER — SODIUM CHLORIDE 0.9 % IV SOLN
Freq: Once | INTRAVENOUS | Status: AC
  Filled 2021-07-17: qty 250

## 2021-07-17 MED ORDER — METHYLPREDNISOLONE SODIUM SUCC 125 MG IJ SOLR
125.0000 mg | Freq: Once | INTRAMUSCULAR | Status: AC | PRN
  Administered 2021-07-17: 125 mg via INTRAVENOUS

## 2021-07-17 MED ORDER — SODIUM CHLORIDE 0.9 % IV SOLN
10.0000 mg/kg | Freq: Once | INTRAVENOUS | Status: AC
  Administered 2021-07-17: 900 mg via INTRAVENOUS
  Filled 2021-07-17: qty 40

## 2021-07-17 MED ORDER — DIPHENHYDRAMINE HCL 50 MG/ML IJ SOLN
25.0000 mg | Freq: Once | INTRAMUSCULAR | Status: AC
  Administered 2021-07-17: 25 mg via INTRAVENOUS
  Filled 2021-07-17: qty 1

## 2021-07-17 MED ORDER — ACETAMINOPHEN 325 MG PO TABS
650.0000 mg | ORAL_TABLET | Freq: Once | ORAL | Status: AC
  Administered 2021-07-17: 650 mg via ORAL
  Filled 2021-07-17: qty 2

## 2021-07-17 MED ORDER — DIPHENHYDRAMINE HCL 50 MG/ML IJ SOLN
25.0000 mg | Freq: Once | INTRAMUSCULAR | Status: AC | PRN
  Administered 2021-07-17: 25 mg via INTRAVENOUS

## 2021-07-17 NOTE — Patient Instructions (Signed)
[facility] [place]  Discharge Instructions: Thank you for choosing [facility] to provide your oncology and hematology care.  If you have a lab appointment with the [facility], please go directly to the [facility] and check in at the registration area.  Wear comfortable clothing and clothing appropriate for easy access to any Portacath or PICC line.   We strive to give you quality time with your provider. You may need to reschedule your appointment if you arrive late (15 or more minutes).  Arriving late affects you and other patients whose appointments are after yours.  Also, if you miss three or more appointments without notifying the office, you may be dismissed from the clinic at the providers discretion.      For prescription refill requests, have your pharmacy contact our office and allow 72 hours for refills to be completed.    Today you received the following chemotherapy and/or immunotherapy agents Taxotere and Cyramza      To help prevent nausea and vomiting after your treatment, we encourage you to take your nausea medication as directed.  BELOW ARE SYMPTOMS THAT SHOULD BE REPORTED IMMEDIATELY: *FEVER GREATER THAN 100.4 F (38 C) OR HIGHER *CHILLS OR SWEATING *NAUSEA AND VOMITING THAT IS NOT CONTROLLED WITH YOUR NAUSEA MEDICATION *UNUSUAL SHORTNESS OF BREATH *UNUSUAL BRUISING OR BLEEDING *URINARY PROBLEMS (pain or burning when urinating, or frequent urination) *BOWEL PROBLEMS (unusual diarrhea, constipation, pain near the anus) TENDERNESS IN MOUTH AND THROAT WITH OR WITHOUT PRESENCE OF ULCERS (sore throat, sores in mouth, or a toothache) UNUSUAL RASH, SWELLING OR PAIN  UNUSUAL VAGINAL DISCHARGE OR ITCHING   Items with * indicate a potential emergency and should be followed up as soon as possible or go to the Emergency Department if any problems should occur.  Please show the CHEMOTHERAPY ALERT CARD or IMMUNOTHERAPY ALERT CARD at  check-in to the Emergency Department and triage nurse.  Should you have questions after your visit or need to cancel or reschedule your appointment, please contact [facility] [place] [facility] [place]  [phone] and follow the prompts.  Office hours are 8:00 a.m. to 4:30 p.m. Monday - Friday. Please note that voicemails left after 4:00 p.m. may not be returned until the following business day.  We are closed weekends and major holidays. You have access to a nurse at all times for urgent questions. Please call the main number to the clinic [phone] and follow the prompts.  For any non-urgent questions, you may also contact your provider using MyChart. We now offer e-Visits for anyone 59 and older to request care online for non-urgent symptoms. For details visit [email or web address].   Also download the MyChart app! Go to the app store, search "MyChart", open the app, select [facility], and log in with your MyChart username and password.  Due to Covid, a mask is required upon entering the hospital/clinic. If you do not have a mask, one will be given to you upon arrival. For doctor visits, patients may have 1 support person aged 70 or older with them. For treatment visits, patients cannot have anyone with them due to current Covid guidelines and our immunocompromised population.     Docetaxel injection What is this medication? DOCETAXEL (doe se TAX el) is a chemotherapy drug. It targets fast dividing cells, like cancer cells, and causes these cells to die. This medicine is used to treat many types of cancers like breast cancer, certain stomach cancers, head and neck cancer, lung cancer,  and prostate cancer. This medicine may be used for other purposes; ask your health care provider or pharmacist if you have questions. COMMON BRAND NAME(S): Docefrez, Taxotere What should I tell my care team before I take this medication? They need to know if you have any of these  conditions: infection (especially a virus infection such as chickenpox, cold sores, or herpes) liver disease low blood counts, like low white cell, platelet, or red cell counts an unusual or allergic reaction to docetaxel, polysorbate 80, other chemotherapy agents, other medicines, foods, dyes, or preservatives pregnant or trying to get pregnant breast-feeding How should I use this medication? This drug is given as an infusion into a vein. It is administered in a hospital or clinic by a specially trained health care professional. Talk to your pediatrician regarding the use of this medicine in children. Special care may be needed. Overdosage: If you think you have taken too much of this medicine contact a poison control center or emergency room at once. NOTE: This medicine is only for you. Do not share this medicine with others. What if I miss a dose? It is important not to miss your dose. Call your doctor or health care professional if you are unable to keep an appointment. What may interact with this medication? Do not take this medicine with any of the following medications: live virus vaccines This medicine may also interact with the following medications: aprepitant certain antibiotics like erythromycin or clarithromycin certain antivirals for HIV or hepatitis certain medicines for fungal infections like fluconazole, itraconazole, ketoconazole, posaconazole, or voriconazole cimetidine ciprofloxacin conivaptan cyclosporine dronedarone fluvoxamine grapefruit juice imatinib verapamil This list may not describe all possible interactions. Give your health care provider a list of all the medicines, herbs, non-prescription drugs, or dietary supplements you use. Also tell them if you smoke, drink alcohol, or use illegal drugs. Some items may interact with your medicine. What should I watch for while using this medication? Your condition will be monitored carefully while you are receiving  this medicine. You will need important blood work done while you are taking this medicine. Call your doctor or health care professional for advice if you get a fever, chills or sore throat, or other symptoms of a cold or flu. Do not treat yourself. This drug decreases your body's ability to fight infections. Try to avoid being around people who are sick. Some products may contain alcohol. Ask your health care professional if this medicine contains alcohol. Be sure to tell all health care professionals you are taking this medicine. Certain medicines, like metronidazole and disulfiram, can cause an unpleasant reaction when taken with alcohol. The reaction includes flushing, headache, nausea, vomiting, sweating, and increased thirst. The reaction can last from 30 minutes to several hours. You may get drowsy or dizzy. Do not drive, use machinery, or do anything that needs mental alertness until you know how this medicine affects you. Do not stand or sit up quickly, especially if you are an older patient. This reduces the risk of dizzy or fainting spells. Alcohol may interfere with the effect of this medicine. Talk to your health care professional about your risk of cancer. You may be more at risk for certain types of cancer if you take this medicine. Do not become pregnant while taking this medicine or for 6 months after stopping it. Women should inform their doctor if they wish to become pregnant or think they might be pregnant. There is a potential for serious side effects to an unborn  child. Talk to your health care professional or pharmacist for more information. Do not breast-feed an infant while taking this medicine or for 1 week after stopping it. Males who get this medicine must use a condom during sex with females who can get pregnant. If you get a woman pregnant, the baby could have birth defects. The baby could die before they are born. You will need to continue wearing a condom for 3 months after  stopping the medicine. Tell your health care provider right away if your partner becomes pregnant while you are taking this medicine. This may interfere with the ability to father a child. You should talk to your doctor or health care professional if you are concerned about your fertility. What side effects may I notice from receiving this medication? Side effects that you should report to your doctor or health care professional as soon as possible: allergic reactions like skin rash, itching or hives, swelling of the face, lips, or tongue blurred vision breathing problems changes in vision low blood counts - This drug may decrease the number of white blood cells, red blood cells and platelets. You may be at increased risk for infections and bleeding. nausea and vomiting pain, redness or irritation at site where injected pain, tingling, numbness in the hands or feet redness, blistering, peeling, or loosening of the skin, including inside the mouth signs of decreased platelets or bleeding - bruising, pinpoint red spots on the skin, black, tarry stools, nosebleeds signs of decreased red blood cells - unusually weak or tired, fainting spells, lightheadedness signs of infection - fever or chills, cough, sore throat, pain or difficulty passing urine swelling of the ankle, feet, hands Side effects that usually do not require medical attention (report to your doctor or health care professional if they continue or are bothersome): constipation diarrhea fingernail or toenail changes hair loss loss of appetite mouth sores muscle pain This list may not describe all possible side effects. Call your doctor for medical advice about side effects. You may report side effects to FDA at 1-800-FDA-1088. Where should I keep my medication? This drug is given in a hospital or clinic and will not be stored at home. NOTE: This sheet is a summary. It may not cover all possible information. If you have questions  about this medicine, talk to your doctor, pharmacist, or health care provider.  2022 Elsevier/Gold Standard (2021-02-20 00:00:00)    Ramucirumab injection What is this medication? RAMUCIRUMAB (ra mue SIR ue mab) is a monoclonal antibody. It is used to treat stomach cancer, colorectal cancer, liver cancer, and lung cancer. This medicine may be used for other purposes; ask your health care provider or pharmacist if you have questions. COMMON BRAND NAME(S): Cyramza What should I tell my care team before I take this medication? They need to know if you have any of these conditions: bleeding disorders blood clots heart disease, including heart failure, heart attack, or chest pain (angina) high blood pressure infection (especially a virus infection such as chickenpox, cold sores, or herpes) protein in your urine recent or planning to have surgery stroke an unusual or allergic reaction to ramucirumab, other medicines, foods, dyes, or preservatives pregnant or trying to get pregnant breast-feeding How should I use this medication? This medicine is for infusion into a vein. It is given by a health care professional in a hospital or clinic setting. Talk to your pediatrician regarding the use of this medicine in children. Special care may be needed. Overdosage: If you think  you have taken too much of this medicine contact a poison control center or emergency room at once. NOTE: This medicine is only for you. Do not share this medicine with others. What if I miss a dose? It is important not to miss your dose. Call your doctor or health care professional if you are unable to keep an appointment. What may interact with this medication? Interactions have not been studied. This list may not describe all possible interactions. Give your health care provider a list of all the medicines, herbs, non-prescription drugs, or dietary supplements you use. Also tell them if you smoke, drink alcohol, or use  illegal drugs. Some items may interact with your medicine. What should I watch for while using this medication? Your condition will be monitored carefully while you are receiving this medicine. You will need to to check your blood pressure and have your blood and urine tested while you are taking this medicine. Your condition will be monitored carefully while you are receiving this medicine. This medicine may increase your risk to bruise or bleed. Call your doctor or health care professional if you notice any unusual bleeding. Before having surgery, talk to your health care provider to make sure it is ok. This drug can increase the risk of poor healing of your surgical site or wound. You will need to stop this drug for 28 days before surgery. After surgery, wait at least 2 weeks before restarting this drug. Make sure the surgical site or wound is healed enough before restarting this drug. Talk to your health care provider if questions. Do not become pregnant while taking this medicine or for 3 months after stopping it. Women should inform their doctor if they wish to become pregnant or think they might be pregnant. There is a potential for serious side effects to an unborn child. Talk to your health care professional or pharmacist for more information. Do not breast-feed an infant while taking this medicine or for 2 months after stopping it. This medicine may interfere with the ability to have a child. Talk with your doctor or health care professional if you are concerned about your fertility. What side effects may I notice from receiving this medication? Side effects that you should report to your doctor or health care professional as soon as possible: allergic reactions like skin rash, itching or hives, breathing problems, swelling of the face, lips, or tongue signs of infection - fever or chills, cough, sore throat chest pain or chest tightness confusion dizziness feeling faint or lightheaded,  falls severe abdominal pain severe nausea, vomiting signs and symptoms of bleeding such as bloody or black, tarry stools; red or dark-brown urine; spitting up blood or brown material that looks like coffee grounds; red spots on the skin; unusual bruising or bleeding from the eye, gums, or nose signs and symptoms of a blood clot such as breathing problems; changes in vision; chest pain; severe, sudden headache; pain, swelling, warmth in the leg; trouble speaking; sudden numbness or weakness of the face, arm or leg symptoms of a stroke: change in mental awareness, inability to talk or move one side of the body trouble walking, dizziness, loss of balance or coordination Side effects that usually do not require medical attention (report to your doctor or health care professional if they continue or are bothersome): cold, clammy skin constipation diarrhea headache nausea, vomiting stomach pain unusually slow heartbeat unusually weak or tired This list may not describe all possible side effects. Call your doctor for  medical advice about side effects. You may report side effects to FDA at 1-800-FDA-1088. Where should I keep my medication? This drug is given in a hospital or clinic and will not be stored at home. NOTE: This sheet is a summary. It may not cover all possible information. If you have questions about this medicine, talk to your doctor, pharmacist, or health care provider.  2022 Elsevier/Gold Standard (2021-02-20 00:00:00)

## 2021-07-17 NOTE — Progress Notes (Signed)
7425: No urine protein results at this time, B/P  160/92 and HR 106. Per Gust Rung RN per Dr. Grayland Ormond okay to proceed with scheduled Cyramza and Taxotere treatment.   1123: pt reports feeling cold, pt denies any chills, no distress noted and pt denies any other symptoms. Warm blanket provided and pt states that helped and denies feeling cold.   1128: Pt reports itching of bilateral arms and bilateral hands noted to be red, pt denies any further symptoms at this time. Cyramza stopped and NS started at gravity.Beckey Rutter NP notified. 1130: Beckey Rutter NP reports to chairside.  1135: Per Beckey Rutter NP monitor for 15 minutes.  1139: All symptoms resolved.  1203: Per Beckey Rutter NP restart Cyramza at half of the ordered rate (125cc/hr)   Per Dr. Grayland Ormond UA has been reviewed. Pt reports having to urinate more frequently but denies any other s/s or concerns. Per Grayland Ormond no new orders at this time.    1233: pt tolerated remainder of Cyramza without complications, pt and VS stable and no s/s of distress noted. Per Beckey Rutter NP okay to proceed with Taxotere at this time.   1412: Pt tolerated Taxotere well with no issues. Pt and VS stable and no s/s of distress noted, pt denies any concerns at this time. Per Beckey Rutter NP okay to discharge pt home. Pt educated when to call clinic and when to call 911/report to ER. Pt verbalizes understanding. Pt and VS stable at discharge.

## 2021-07-17 NOTE — Progress Notes (Signed)
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  INTERVAL HISTORY: Patient returns to clinic today for further evaluation and consideration of cycle 1 of Taxotere and Cyramza.  He has increased cough today as well as continued chronic right shoulder pain, but otherwise feels well.  He has no neurologic complaints. He has a good appetite and denies weight loss.  He denies any other pain.  He denies any chest pain, hemoptysis, or shortness of breath.  He has no nausea, vomiting, constipation, or diarrhea.  He has no urinary complaints.  Patient offers no further specific complaints today.  REVIEW OF SYSTEMS:   Review of Systems  Constitutional: Negative.  Negative for fever, malaise/fatigue and weight loss.  HENT: Negative.  Negative for congestion.   Respiratory:  Positive for cough. Negative for hemoptysis and shortness of breath.   Cardiovascular: Negative.  Negative for chest pain and leg swelling.  Gastrointestinal: Negative.  Negative for abdominal pain and heartburn.  Genitourinary: Negative.  Negative for dysuria.  Musculoskeletal:  Positive for joint pain. Negative for back pain.  Skin: Negative.  Negative for rash.  Neurological: Negative.  Negative for dizziness, sensory change, focal weakness, weakness and headaches.  Psychiatric/Behavioral: Negative.  The patient is not nervous/anxious.    As per HPI. Otherwise, a complete review of systems is negative.  PAST MEDICAL HISTORY: Past Medical History:  Diagnosis Date   Asthma    COPD  (chronic obstructive pulmonary disease) ([place])    Diabetes mellitus without complication ([place])    Hypertension    Lung cancer ([place]) 05/2017   Hx Chemo + rad tx's.   Pneumonia     PAST SURGICAL HISTORY: Past Surgical History:  Procedure Laterality Date   ENDOBRONCHIAL ULTRASOUND N/A 05/12/2017   Procedure: ENDOBRONCHIAL ULTRASOUND;  Surgeon: Laverle Hobby, MD;  Location: [facility] ORS;  Service: Pulmonary;  Laterality: N/A;   FRACTURE SURGERY Left    ANKLE X 2   TONSILLECTOMY      FAMILY HISTORY: Family History  Problem Relation Age of Onset   Stroke Mother    Diabetes Mother    Hypertension Mother    Diabetes Father    Hypertension Father    Diabetes Sister    Diabetes Brother    Heart attack Paternal Uncle    Stroke Maternal Grandmother     ADVANCED DIRECTIVES (Y/N):  N  HEALTH MAINTENANCE: Social History   Tobacco Use   Smoking status: Some Days    Packs/day: 0.50    Types: Cigarettes   Smokeless tobacco: Never  Vaping Use   Vaping Use: Never used  Substance Use Topics   Alcohol use: No   Drug use: No     Colonoscopy:  PAP:  Bone density:  Lipid panel:  Allergies  Allergen Reactions   Shrimp [Shellfish Allergy] Nausea And Vomiting    Current Outpatient Medications  Medication Sig Dispense Refill   albuterol (PROVENTIL) (2.5 MG/3ML) 0.083% nebulizer solution Take 2.5 mg by nebulization every 6 (six) hours as needed for wheezing or shortness of breath.     albuterol (VENTOLIN HFA) 108 (90 Base) MCG/ACT inhaler Inhale  2 puffs into the lungs every 6 (six) hours as needed for wheezing or shortness of breath. 1 Inhaler 0   amLODipine (NORVASC) 5 MG tablet Take 10 mg by mouth daily.     ANORO ELLIPTA 62.5-25 MCG/INH AEPB INHALE 1 PUFF INTO THE LUNGS DAILY 60 each 2   atorvastatin (LIPITOR) 20 MG tablet Take 20 mg by mouth daily.     baclofen (LIORESAL) 10 MG tablet Take 1 tablet (10 mg total) by mouth 3 (three) times daily. 30 each 0   glipiZIDE  (GLUCOTROL) 5 MG tablet Take 10 mg by mouth daily before breakfast.      ibuprofen (ADVIL,MOTRIN) 200 MG tablet Take 400-800 mg every 8 (eight) hours as needed by mouth (for pain.).     lisinopril-hydrochlorothiazide (ZESTORETIC) 20-25 MG tablet Take 1 tablet by mouth daily. 30 tablet 0   metFORMIN (GLUCOPHAGE) 1000 MG tablet Take 1 tablet (1,000 mg total) by mouth 2 (two) times daily. 60 tablet 0   ondansetron (ZOFRAN) 8 MG tablet Take 1 tablet (8 mg total) by mouth 2 (two) times daily as needed for refractory nausea / vomiting. 60 tablet 2   prochlorperazine (COMPAZINE) 10 MG tablet Take 1 tablet (10 mg total) by mouth every 6 (six) hours as needed (Nausea or vomiting). 60 tablet 2   traMADol (ULTRAM) 50 MG tablet Take 1-2 tablets (50-100 mg total) by mouth every 6 (six) hours as needed. 60 tablet 1   TRELEGY ELLIPTA 200-62.5-25 MCG/INH AEPB Take 1 puff by mouth daily.     ALPRAZolam (XANAX) 0.5 MG tablet Take 1 tablet (0.5 mg total) by mouth as needed for anxiety (Before radiation treatments). (Patient not taking: Reported on 02/28/2021) 10 tablet 0   lidocaine (LIDODERM) 5 % Place 1 patch onto the skin daily. Remove & Discard patch within 12 hours or as directed by MD (Patient not taking: Reported on 06/05/2021) 30 patch 0   omeprazole (PRILOSEC) 20 MG capsule TAKE 1 CAPSULE(20 MG) BY MOUTH DAILY (Patient not taking: Reported on 02/28/2021) 30 capsule 2   No current facility-administered medications for this visit.    OBJECTIVE: Vitals:   07/17/21 0836  BP: (!) 160/92  Pulse: (!) 106  Resp: 18  Temp: (!) 96.4 F (35.8 C)  SpO2: 98%      Body mass index is 28.77 kg/m.    ECOG FS:0 - Asymptomatic  General: Well-developed, well-nourished, no acute distress. Eyes: Pink conjunctiva, anicteric sclera. HEENT: Normocephalic, moist mucous membranes. Lungs: No audible wheezing. Heart: Regular rate and rhythm. Abdomen: Soft, nontender, no obvious distention. Musculoskeletal: No edema,  cyanosis, or clubbing. Neuro: Alert, answering all questions appropriately. Cranial nerves grossly intact. Skin: No rashes or petechiae noted. Psych: Normal affect.   LAB RESULTS:  Lab Results  Component Value Date   NA 128 (L) 07/17/2021   K 3.5 07/17/2021   CL 85 (L) 07/17/2021   CO2 29 07/17/2021   GLUCOSE 232 (H) 07/17/2021   BUN 14 07/17/2021   CREATININE 0.83 07/17/2021   CALCIUM 9.8 07/17/2021   PROT 7.8 07/17/2021   ALBUMIN 4.3 07/17/2021   AST 22 07/17/2021   ALT 24 07/17/2021   ALKPHOS 116 07/17/2021   BILITOT 0.2 (L) 07/17/2021   GFRNONAA >60 07/17/2021   GFRAA >60 11/12/2019    Lab Results  Component Value Date   WBC 10.8 (H) 07/17/2021   NEUTROABS 8.5 (H) 07/17/2021   HGB 16.4 07/17/2021   HCT 47.7 07/17/2021   MCV 87.2 07/17/2021   PLT  334 07/17/2021     STUDIES: NM PET Image Restag (PS) Skull Base To Thigh  Addendum Date: 07/05/2021   ADDENDUM REPORT: 07/05/2021 17:13 ADDENDUM: 5. 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 Electronically Signed   By: Ilona Sorrel M.D.   On: 07/05/2021 17:13   Result Date: 07/05/2021 CLINICAL DATA:  Subsequent treatment strategy for right upper lobe squamous cell lung cancer with history of recurrence. EXAM: NUCLEAR MEDICINE PET SKULL BASE TO THIGH TECHNIQUE: 10.8 mCi F-18 FDG was injected intravenously. Full-ring PET imaging was performed from the skull base to thigh after the radiotracer. CT data was obtained and used for attenuation correction and anatomic localization. Fasting blood glucose: 183 mg/dl COMPARISON:  09/18/2020 PET-CT.  06/01/2021 chest CT. FINDINGS: Mediastinal blood pool activity: SUV max 1.9 Liver activity: SUV max NA NECK: No hypermetabolic lymph nodes in the neck. Hypermetabolic posterior left parotid 1.4 cm soft tissue nodule with max SUV 5.1  (series 3/image 36), previously 1.4 cm with max SUV 6.2, not substantially changed. Incidental CT findings: Mucoperiosteal thickening in the bilateral ethmoidal air cells. Tiny mucous retention cyst versus polyp in inferior right maxillary sinus. CHEST: Hypermetabolic lobulated solid and partially cavitary 4.6 x 3.0 cm anterior apical left upper lobe lung mass with max SUV 8.9 (series 3/image 87), previously separate 1.8 cm and 1.1 cm pulmonary nodules with max SUV 7.7, substantially increased in size and mildly increased in metabolism since 09/18/2020 PET-CT. No additional hypermetabolic pulmonary nodules. Irregular thick walled cavitary 1.7 cm superior segment right lower lobe pulmonary nodule demonstrates no hypermetabolism with max SUV 1.1 (series 3/image 113), decreased from 1.9 cm with previous max SUV 2.9 on 09/18/2020 PET-CT. Stable sharply marginated upper right perihilar lung consolidation with associated mild bronchiectasis, architectural distortion and volume loss, and with low level non focal FDG uptake, compatible with radiation fibrosis. No enlarged or hypermetabolic axillary, mediastinal or hilar lymph nodes. Incidental CT findings: Mild centrilobular and paraseptal emphysema. Mild patchy tree-in-bud opacities in the anterior right lower lobe are new from prior PET-CT. Coronary atherosclerosis. Atherosclerotic nonaneurysmal thoracic aorta. ABDOMEN/PELVIS: No abnormal hypermetabolic activity within the liver, pancreas, adrenal glands, or spleen. No hypermetabolic lymph nodes in the abdomen or pelvis. Incidental CT findings: Small hiatal hernia. Stable 1.4 cm right adrenal nodule with density -6 HU and stable 1.9 cm left adrenal nodule with density -2 HU, without hypermetabolism, compatible with bilateral benign adenomas. Atherosclerotic nonaneurysmal abdominal aorta. Simple 3.1 cm lateral lower left renal cyst. Mildly enlarged prostate. SKELETON: No focal hypermetabolic activity to suggest skeletal  metastasis. Incidental CT findings: none IMPRESSION: 1. Hypermetabolic lobulated solid and partially cavitary 4.6 cm apical left upper lobe lung mass, substantially increased in size and mildly increased in metabolism since 09/18/2020 PET-CT, compatible with conglomerate pulmonary metastasis. 2. No additional sites of hypermetabolic metastatic disease. Small cavitary right lower lobe pulmonary nodule is mildly decreased in size and is no longer hypermetabolic compared to 79/07/4095 PET-CT. 3. Stable right upper perihilar radiation fibrosis. Mild right lower lobe bronchiolitis. 4. Chronic findings include: Aortic Atherosclerosis (ICD10-I70.0) and Emphysema (ICD10-J43.9). Chronic paranasal sinusitis. Coronary atherosclerosis. Stable bilateral adrenal adenomas. Mild prostatomegaly. Small hiatal hernia. Electronically Signed: By: Ilona Sorrel M.D. On: 07/05/2021 16:58    ONCOLOGY HISTORY: Patient initially underwent treatment with weekly carboplatinum and Taxol along with daily XRT.  He subsequently completed 1 year of maintenance durvalumab on  August 27, 2018. CT scan on September 08, 2019 revealed progressive lymphadenopathy in his right supraclavicular and right thoracic inlet.  PET scan completed on September 27, 2019 confirmed likely recurrence.  Biopsy on October 04, 2019 consistent with squamous cell carcinoma. He completed XRT only for his local recurrence on November 17, 2019.  Repeat CT scan on August 28, 2020 with continued progression of 3 pulmonary nodules in his left and right upper lobe as well as right lower lobe.  PET scan results completed on September 18, 2020 revealed a right lower lobe nodule has a minimally elevated SUV of 2.9, the right upper lobe nodule has an SUV of 7.7 and additional 11 mm nodule in the left apex as an SUV of 5.3.  Patient has now completed XRT to these lesions.   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  PLAN:    1.   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  PET scan results from July 05, 2021 reviewed independently and report as above with 4.6 cm left upper lobe mass substantially increased in size and hypermetabolism.  No additional sites of hypermetabolic disease are noted.  Given previous XRT to this area, patient has agreed to systemic chemotherapy using Taxotere and Cyramza every 3 weeks.  Hold Udenyca for cycle 1, but this may be necessary for subsequent cycles.  Repeat PET scan after 4 cycles.  Proceed with cycle 1 of treatment today.  Return to clinic in 1 week for laboratory work and further evaluation and then in 3 weeks for laboratory work, further evaluation, and consideration of cycle 2.       2.  Hypertension: Chronic and unchanged.  Continue evaluation and treatment per primary care. 3.  Cough: Chronic and unchanged.  Continue OTC medications as needed.  Patient has also been instructed to use his inhaler more frequently. 4.  Shoulder pain: Chronic and unchanged.  Continue tramadol as needed.  5   Hyperglycemia: Patient continues to have poor blood glucose control.  Continue evaluation and treatment per primary care.    Patient expressed understanding and was in agreement with this plan. He also understands that He can call clinic at any time with any questions, concerns, or complaints.    Cancer Staging  Squamous cell lung cancer, left [facility]) Staging form: Lung, AJCC 8th Edition - Clinical stage from 05/16/2017: Stage IIIB (cT3, cN2, cM0) - Signed by Lloyd Huger, MD on 05/16/2017   Lloyd Huger, MD   07/17/2021 9:07 AM

## 2021-07-18 LAB — T4: T4, Total: 10.4 ug/dL (ref 4.5–12.0)

## 2021-07-19 ENCOUNTER — Other Ambulatory Visit: Payer: Self-pay | Admitting: Oncology

## 2021-07-20 ENCOUNTER — Other Ambulatory Visit: Payer: Self-pay | Admitting: Emergency Medicine

## 2021-07-20 ENCOUNTER — Encounter: Payer: Self-pay | Admitting: Emergency Medicine

## 2021-07-20 ENCOUNTER — Telehealth: Payer: Self-pay | Admitting: *Deleted

## 2021-07-20 MED ORDER — HYDROCODONE-ACETAMINOPHEN 5-325 MG PO TABS: 1.0000 | ORAL_TABLET | Freq: Four times a day (QID) | ORAL | 0 refills | Status: DC | PRN

## 2021-07-20 NOTE — Telephone Encounter (Signed)
Spoke with patient, states pt taking 2 tramadol (100mg ) twice/day with no relief. Pt made aware that rx is for every 6 hours as needed. Reports trying biofreeze/excedrin/tylenol in addition to tramadol with no relief. Pt states "Dr Grayland Ormond said they pain may be from the tumor itself." Pain was bearable yesterday but had to miss work today d/t pain.

## 2021-07-20 NOTE — Telephone Encounter (Signed)
Patient called reporting that he had chemotherapy Tuesday and was fine until last evening after he returned to work Thursday, he developed pain in his upper back and neck area rated at 6-7/10 He took 2 Tramadol tabs that did not touch the pain he is having. He reports that he has had pain in this area before but not for a while. Please advise

## 2021-07-20 NOTE — Telephone Encounter (Signed)
Per Merrily Pew, NP, pt advised to make sure he is maximizing the tramadol 2 tabs every 6 hours, and to also take 500mg  of tylenol with 200mg  of ibuprofen in between the tramadol. Pt still requesting stronger medication. Per verbal direction, norco rx routed to Josh to be signed and sent to pt pharmacy

## 2021-07-23 NOTE — Progress Notes (Signed)
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  INTERVAL HISTORY: Patient returns to clinic today for further evaluation and to assess his toleration of cycle 1 of Taxotere and Cyramza.  He had significant increased weakness and fatigue and nausea with vomiting 2 to 3 days after treatment.  His nausea has resolved, but his fatigue persists.  He also has a significant increase in his right shoulder pain.  He has no neurologic complaints. He has a good appetite and denies weight loss.  He denies any other pain.  He denies any chest pain, hemoptysis, or shortness of breath.  He has no constipation or diarrhea.  He has no urinary complaints.  Patient offers no further specific complaints today.  REVIEW OF SYSTEMS:   Review of Systems  Constitutional:  Positive for malaise/fatigue. Negative for fever and weight loss.  HENT: Negative.  Negative for congestion.   Respiratory:  Positive for cough. Negative for hemoptysis and shortness of breath.   Cardiovascular: Negative.  Negative for chest pain and leg swelling.  Gastrointestinal:  Positive for heartburn, nausea and vomiting. Negative for abdominal pain.  Genitourinary: Negative.  Negative for dysuria.  Musculoskeletal:  Positive for joint pain. Negative for back pain.  Skin: Negative.  Negative for rash.  Neurological:  Positive for weakness. Negative for dizziness, sensory change, focal weakness and headaches.  Psychiatric/Behavioral: Negative.  The patient is not nervous/anxious.    As per HPI.  Otherwise, a complete review of systems is negative.  PAST MEDICAL HISTORY: Past Medical History:  Diagnosis Date   Asthma    COPD (chronic obstructive pulmonary disease) ([place])    Diabetes mellitus without complication ([place])    Hypertension    Lung cancer ([place]) 05/2017   Hx Chemo + rad tx's.   Pneumonia     PAST SURGICAL HISTORY: Past Surgical History:  Procedure Laterality Date   ENDOBRONCHIAL ULTRASOUND N/A 05/12/2017   Procedure: ENDOBRONCHIAL ULTRASOUND;  Surgeon: Laverle Hobby, MD;  Location: [facility] ORS;  Service: Pulmonary;  Laterality: N/A;   FRACTURE SURGERY Left    ANKLE X 2   TONSILLECTOMY      FAMILY HISTORY: Family History  Problem Relation Age of Onset   Stroke Mother    Diabetes Mother    Hypertension Mother    Diabetes Father    Hypertension Father    Diabetes Sister    Diabetes Brother    Heart attack Paternal Uncle    Stroke Maternal Grandmother     ADVANCED DIRECTIVES (Y/N):  N  HEALTH MAINTENANCE: Social History   Tobacco Use   Smoking status: Some Days    Packs/day: 0.50    Types: Cigarettes   Smokeless tobacco: Never  Vaping Use   Vaping Use: Never used  Substance Use Topics   Alcohol use: No   Drug use: No     Colonoscopy:  PAP:  Bone density:  Lipid panel:  Allergies  Allergen Reactions   Shrimp [Shellfish Allergy] Nausea And Vomiting    Current Outpatient Medications  Medication Sig Dispense Refill   albuterol (PROVENTIL) (2.5 MG/3ML) 0.083% nebulizer solution Take 2.5 mg  by nebulization every 6 (six) hours as needed for wheezing or shortness of breath.     albuterol (VENTOLIN HFA) 108 (90 Base) MCG/ACT inhaler Inhale 2 puffs into the lungs every 6 (six) hours as needed for wheezing or shortness of breath. 1 Inhaler 0   amLODipine (NORVASC) 5 MG tablet Take 10 mg by mouth daily.     ANORO ELLIPTA 62.5-25 MCG/INH AEPB INHALE 1 PUFF INTO THE LUNGS DAILY 60 each 2   atorvastatin (LIPITOR) 20 MG tablet Take 20 mg by  mouth daily.     baclofen (LIORESAL) 10 MG tablet TAKE 1 TABLET(10 MG) BY MOUTH THREE TIMES DAILY 90 tablet 1   fentaNYL (DURAGESIC) 25 MCG/HR Place 1 patch onto the skin every 3 (three) days. 10 patch 0   glipiZIDE (GLUCOTROL) 5 MG tablet Take 10 mg by mouth daily before breakfast.      ibuprofen (ADVIL,MOTRIN) 200 MG tablet Take 400-800 mg every 8 (eight) hours as needed by mouth (for pain.).     lisinopril-hydrochlorothiazide (ZESTORETIC) 20-25 MG tablet Take 1 tablet by mouth daily. 30 tablet 0   metFORMIN (GLUCOPHAGE) 1000 MG tablet Take 1 tablet (1,000 mg total) by mouth 2 (two) times daily. 60 tablet 0   ondansetron (ZOFRAN) 8 MG tablet Take 1 tablet (8 mg total) by mouth 2 (two) times daily as needed for refractory nausea / vomiting. 60 tablet 2   prochlorperazine (COMPAZINE) 10 MG tablet Take 1 tablet (10 mg total) by mouth every 6 (six) hours as needed (Nausea or vomiting). 60 tablet 2   TRELEGY ELLIPTA 200-62.5-25 MCG/INH AEPB Take 1 puff by mouth daily.     ALPRAZolam (XANAX) 0.5 MG tablet Take 1 tablet (0.5 mg total) by mouth as needed for anxiety (Before radiation treatments). (Patient not taking: Reported on 02/28/2021) 10 tablet 0   HYDROcodone-acetaminophen (NORCO) 5-325 MG tablet Take 2 tablets by mouth every 6 (six) hours as needed for moderate pain. 90 tablet 0   lidocaine (LIDODERM) 5 % Place 1 patch onto the skin daily. Remove & Discard patch within 12 hours or as directed by MD (Patient not taking: Reported on 06/05/2021) 30 patch 0   omeprazole (PRILOSEC) 20 MG capsule TAKE 1 CAPSULE(20 MG) BY MOUTH TWICE A DAY 60 capsule 2   No current facility-administered medications for this visit.    OBJECTIVE: Vitals:   07/24/21 0946  BP: 127/82  Pulse: 92  Temp: 98.2 F (36.8 C)      Body mass index is 28.86 kg/m.    ECOG FS:0 - Asymptomatic  General: Well-developed, well-nourished, no acute distress. Eyes: Pink conjunctiva, anicteric sclera. HEENT: Normocephalic, moist  mucous membranes. Lungs: No audible wheezing or coughing. Heart: Regular rate and rhythm. Abdomen: Soft, nontender, no obvious distention. Musculoskeletal: No edema, cyanosis, or clubbing. Neuro: Alert, answering all questions appropriately. Cranial nerves grossly intact. Skin: No rashes or petechiae noted. Psych: Normal affect.  LAB RESULTS:  Lab Results  Component Value Date   NA 122 (L) 07/24/2021   K 3.7 07/24/2021   CL 82 (L) 07/24/2021   CO2 30 07/24/2021   GLUCOSE 172 (H) 07/24/2021   BUN 16 07/24/2021   CREATININE 0.71 07/24/2021   CALCIUM 8.9 07/24/2021   PROT 6.7 07/24/2021   ALBUMIN 3.8 07/24/2021   AST 25 07/24/2021   ALT 33 07/24/2021   ALKPHOS 90 07/24/2021   BILITOT 0.5 07/24/2021   GFRNONAA >60 07/24/2021   GFRAA >60 11/12/2019    Lab Results  Component Value Date  WBC 4.2 07/24/2021   NEUTROABS 3.2 07/24/2021   HGB 15.5 07/24/2021   HCT 43.8 07/24/2021   MCV 84.2 07/24/2021   PLT 302 07/24/2021     STUDIES: NM PET Image Restag (PS) Skull Base To Thigh  Addendum Date: 07/05/2021   ADDENDUM REPORT: 07/05/2021 17:13 ADDENDUM: 5. 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 Electronically Signed   By: Ilona Sorrel M.D.   On: 07/05/2021 17:13   Result Date: 07/05/2021 CLINICAL DATA:  Subsequent treatment strategy for right upper lobe squamous cell lung cancer with history of recurrence. EXAM: NUCLEAR MEDICINE PET SKULL BASE TO THIGH TECHNIQUE: 10.8 mCi F-18 FDG was injected intravenously. Full-ring PET imaging was performed from the skull base to thigh after the radiotracer. CT data was obtained and used for attenuation correction and anatomic localization. Fasting blood glucose: 183 mg/dl COMPARISON:  09/18/2020 PET-CT.  06/01/2021 chest CT. FINDINGS: Mediastinal blood pool activity: SUV max 1.9 Liver  activity: SUV max NA NECK: No hypermetabolic lymph nodes in the neck. Hypermetabolic posterior left parotid 1.4 cm soft tissue nodule with max SUV 5.1 (series 3/image 36), previously 1.4 cm with max SUV 6.2, not substantially changed. Incidental CT findings: Mucoperiosteal thickening in the bilateral ethmoidal air cells. Tiny mucous retention cyst versus polyp in inferior right maxillary sinus. CHEST: Hypermetabolic lobulated solid and partially cavitary 4.6 x 3.0 cm anterior apical left upper lobe lung mass with max SUV 8.9 (series 3/image 87), previously separate 1.8 cm and 1.1 cm pulmonary nodules with max SUV 7.7, substantially increased in size and mildly increased in metabolism since 09/18/2020 PET-CT. No additional hypermetabolic pulmonary nodules. Irregular thick walled cavitary 1.7 cm superior segment right lower lobe pulmonary nodule demonstrates no hypermetabolism with max SUV 1.1 (series 3/image 113), decreased from 1.9 cm with previous max SUV 2.9 on 09/18/2020 PET-CT. Stable sharply marginated upper right perihilar lung consolidation with associated mild bronchiectasis, architectural distortion and volume loss, and with low level non focal FDG uptake, compatible with radiation fibrosis. No enlarged or hypermetabolic axillary, mediastinal or hilar lymph nodes. Incidental CT findings: Mild centrilobular and paraseptal emphysema. Mild patchy tree-in-bud opacities in the anterior right lower lobe are new from prior PET-CT. Coronary atherosclerosis. Atherosclerotic nonaneurysmal thoracic aorta. ABDOMEN/PELVIS: No abnormal hypermetabolic activity within the liver, pancreas, adrenal glands, or spleen. No hypermetabolic lymph nodes in the abdomen or pelvis. Incidental CT findings: Small hiatal hernia. Stable 1.4 cm right adrenal nodule with density -6 HU and stable 1.9 cm left adrenal nodule with density -2 HU, without hypermetabolism, compatible with bilateral benign adenomas. Atherosclerotic nonaneurysmal  abdominal aorta. Simple 3.1 cm lateral lower left renal cyst. Mildly enlarged prostate. SKELETON: No focal hypermetabolic activity to suggest skeletal metastasis. Incidental CT findings: none IMPRESSION: 1. Hypermetabolic lobulated solid and partially cavitary 4.6 cm apical left upper lobe lung mass, substantially increased in size and mildly increased in metabolism since 09/18/2020 PET-CT, compatible with conglomerate pulmonary metastasis. 2. No additional sites of hypermetabolic metastatic disease. Small cavitary right lower lobe pulmonary nodule is mildly decreased in size and is no longer hypermetabolic compared to 35/70/1779 PET-CT. 3. Stable right upper perihilar radiation fibrosis. Mild right lower lobe bronchiolitis. 4. Chronic findings include: Aortic Atherosclerosis (ICD10-I70.0) and Emphysema (ICD10-J43.9). Chronic paranasal sinusitis. Coronary atherosclerosis. Stable bilateral adrenal adenomas. Mild prostatomegaly. Small hiatal hernia. Electronically Signed: By: Ilona Sorrel M.D. On: 07/05/2021 16:58  ONCOLOGY HISTORY: Patient initially underwent treatment with weekly carboplatinum and Taxol along with daily XRT.  He subsequently completed 1 year of maintenance durvalumab on August 27, 2018. CT scan on September 08, 2019 revealed progressive lymphadenopathy in his right supraclavicular and right thoracic inlet.  PET scan completed on September 27, 2019 confirmed likely recurrence.  Biopsy on October 04, 2019 consistent with squamous cell carcinoma. He completed XRT only for his local recurrence on November 17, 2019.  Repeat CT scan on August 28, 2020 with continued progression of 3 pulmonary nodules in his left and right upper lobe as well as right lower lobe.  PET scan results completed on September 18, 2020 revealed a right lower lobe nodule has a minimally elevated SUV of 2.9, the right upper lobe nodule has an SUV of 7.7 and additional 11 mm nodule in the left apex as an SUV of 5.3.  Patient has now completed XRT to  these lesions.   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  PLAN:    1.  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  PET scan results from July 05, 2021 reviewed independently and report as above with 4.6 cm left upper lobe mass substantially increased in size and hypermetabolism.  No additional sites of hypermetabolic disease are noted.  Given previous XRT to this area, patient has agreed to systemic chemotherapy using Taxotere and Cyramza every 3 weeks.  Hold Udenyca for cycle 1, but this may be necessary for subsequent cycles.  Repeat PET scan after 4 cycles.  Patient had some difficulty with cycle 1 of treatment, but these symptoms are improving.  Continue current regimen as planned.  Return to clinic in 2 weeks for further evaluation and consideration of cycle 2.         2.  Hypertension: Chronic and unchanged.  Continue evaluation and treatment per primary care. 3.  Cough: Chronic and unchanged.  Continue OTC medications as needed.  Patient has also been instructed to use his inhaler more frequently. 4.  Shoulder pain: Significantly worse.  Patient was given a prescription for fentanyl 25 mcg patch as well as hydrocodone. 5.  Hyperglycemia: Patient continues to have poor blood glucose control.  Continue evaluation and treatment per primary care. 6.  Reflux: Patient was given a prescription for omeprazole today. 7.  Nausea and vomiting: Improved.  Patient has been instructed to take his antiemetics regularly after his next treatment.    Patient expressed understanding and was in agreement with this plan. He also understands that He can call clinic at any time with any questions, concerns, or complaints.    Cancer Staging  Squamous cell lung cancer, left  [facility]) Staging form: Lung, AJCC 8th Edition - Clinical stage from 05/16/2017: Stage IIIB (cT3, cN2, cM0) - Signed by Lloyd Huger, MD on 05/16/2017   Lloyd Huger, MD   07/24/2021 12:16 PM

## 2021-07-24 ENCOUNTER — Encounter: Payer: Self-pay | Admitting: *Deleted

## 2021-07-24 ENCOUNTER — Inpatient Hospital Stay: Payer: 59 | Attending: Oncology

## 2021-07-24 ENCOUNTER — Encounter: Payer: Self-pay | Admitting: Oncology

## 2021-07-24 ENCOUNTER — Inpatient Hospital Stay (HOSPITAL_BASED_OUTPATIENT_CLINIC_OR_DEPARTMENT_OTHER): Payer: 59 | Admitting: Oncology

## 2021-07-24 ENCOUNTER — Other Ambulatory Visit: Payer: Self-pay

## 2021-07-24 ENCOUNTER — Telehealth: Payer: Self-pay | Admitting: *Deleted

## 2021-07-24 VITALS — BP 127/82 | HR 92 | Temp 98.2°F | Wt 189.8 lb

## 2021-07-24 DIAGNOSIS — I10 Essential (primary) hypertension: Secondary | ICD-10-CM | POA: Diagnosis not present

## 2021-07-24 DIAGNOSIS — C3412 Malignant neoplasm of upper lobe, left bronchus or lung: Secondary | ICD-10-CM | POA: Diagnosis not present

## 2021-07-24 DIAGNOSIS — J449 Chronic obstructive pulmonary disease, unspecified: Secondary | ICD-10-CM | POA: Diagnosis not present

## 2021-07-24 DIAGNOSIS — R531 Weakness: Secondary | ICD-10-CM | POA: Insufficient documentation

## 2021-07-24 DIAGNOSIS — E1165 Type 2 diabetes mellitus with hyperglycemia: Secondary | ICD-10-CM | POA: Diagnosis not present

## 2021-07-24 DIAGNOSIS — M25519 Pain in unspecified shoulder: Secondary | ICD-10-CM | POA: Insufficient documentation

## 2021-07-24 DIAGNOSIS — K219 Gastro-esophageal reflux disease without esophagitis: Secondary | ICD-10-CM | POA: Insufficient documentation

## 2021-07-24 DIAGNOSIS — Z923 Personal history of irradiation: Secondary | ICD-10-CM | POA: Insufficient documentation

## 2021-07-24 DIAGNOSIS — R5383 Other fatigue: Secondary | ICD-10-CM | POA: Insufficient documentation

## 2021-07-24 DIAGNOSIS — M25511 Pain in right shoulder: Secondary | ICD-10-CM | POA: Diagnosis not present

## 2021-07-24 DIAGNOSIS — C3492 Malignant neoplasm of unspecified part of left bronchus or lung: Secondary | ICD-10-CM | POA: Diagnosis not present

## 2021-07-24 DIAGNOSIS — R059 Cough, unspecified: Secondary | ICD-10-CM | POA: Diagnosis not present

## 2021-07-24 DIAGNOSIS — F17210 Nicotine dependence, cigarettes, uncomplicated: Secondary | ICD-10-CM | POA: Diagnosis not present

## 2021-07-24 DIAGNOSIS — Z7984 Long term (current) use of oral hypoglycemic drugs: Secondary | ICD-10-CM | POA: Insufficient documentation

## 2021-07-24 DIAGNOSIS — Z5111 Encounter for antineoplastic chemotherapy: Secondary | ICD-10-CM | POA: Insufficient documentation

## 2021-07-24 DIAGNOSIS — E119 Type 2 diabetes mellitus without complications: Secondary | ICD-10-CM | POA: Diagnosis not present

## 2021-07-24 DIAGNOSIS — R112 Nausea with vomiting, unspecified: Secondary | ICD-10-CM | POA: Diagnosis not present

## 2021-07-24 DIAGNOSIS — Z79899 Other long term (current) drug therapy: Secondary | ICD-10-CM | POA: Diagnosis not present

## 2021-07-24 LAB — COMPREHENSIVE METABOLIC PANEL
ALT: 33 U/L (ref 0–44)
AST: 25 U/L (ref 15–41)
Albumin: 3.8 g/dL (ref 3.5–5.0)
Alkaline Phosphatase: 90 U/L (ref 38–126)
Anion gap: 10 (ref 5–15)
BUN: 16 mg/dL (ref 8–23)
CO2: 30 mmol/L (ref 22–32)
Calcium: 8.9 mg/dL (ref 8.9–10.3)
Chloride: 82 mmol/L — ABNORMAL LOW (ref 98–111)
Creatinine, Ser: 0.71 mg/dL (ref 0.61–1.24)
GFR, Estimated: >60 mL/min (ref >60)
Glucose, Bld: 172 mg/dL — ABNORMAL HIGH (ref 70–99)
Potassium: 3.7 mmol/L (ref 3.5–5.1)
Sodium: 122 mmol/L — ABNORMAL LOW (ref 135–145)
Total Bilirubin: 0.5 mg/dL (ref 0.3–1.2)
Total Protein: 6.7 g/dL (ref 6.5–8.1)

## 2021-07-24 LAB — CBC WITH DIFFERENTIAL/PLATELET
Abs Immature Granulocytes: 0.05 10*3/uL (ref 0.00–0.07)
Basophils Absolute: 0.1 10*3/uL (ref 0.0–0.1)
Basophils Relative: 3 %
Eosinophils Absolute: 0.1 10*3/uL (ref 0.0–0.5)
Eosinophils Relative: 2 %
HCT: 43.8 % (ref 39.0–52.0)
Hemoglobin: 15.5 g/dL (ref 13.0–17.0)
Immature Granulocytes: 1 %
Lymphocytes Relative: 12 %
Lymphs Abs: 0.5 10*3/uL — ABNORMAL LOW (ref 0.7–4.0)
MCH: 29.8 pg (ref 26.0–34.0)
MCHC: 35.4 g/dL (ref 30.0–36.0)
MCV: 84.2 fL (ref 80.0–100.0)
Monocytes Absolute: 0.3 10*3/uL (ref 0.1–1.0)
Monocytes Relative: 6 %
Neutro Abs: 3.2 10*3/uL (ref 1.7–7.7)
Neutrophils Relative %: 76 %
Platelets: 302 10*3/uL (ref 150–400)
RBC: 5.2 MIL/uL (ref 4.22–5.81)
RDW: 11.9 % (ref 11.5–15.5)
WBC: 4.2 10*3/uL (ref 4.0–10.5)
nRBC: 0 % (ref 0.0–0.2)

## 2021-07-24 LAB — TSH: TSH: 3.855 u[IU]/mL (ref 0.350–4.500)

## 2021-07-24 MED ORDER — FENTANYL 25 MCG/HR TD PT72
1.0000 | MEDICATED_PATCH | TRANSDERMAL | 0 refills | Status: DC
Start: 2021-07-24 — End: 2021-08-07

## 2021-07-24 MED ORDER — HYDROCODONE-ACETAMINOPHEN 5-325 MG PO TABS: 2.0000 | ORAL_TABLET | Freq: Four times a day (QID) | ORAL | 0 refills | Status: DC | PRN

## 2021-07-24 MED ORDER — OMEPRAZOLE 20 MG PO CPDR: DELAYED_RELEASE_CAPSULE | ORAL | 2 refills | Status: DC

## 2021-07-24 NOTE — Telephone Encounter (Signed)
Gabriel Mendez - Rx #: [id number]  PA submitted via covermymeds- pending insurance approval

## 2021-07-24 NOTE — Telephone Encounter (Signed)
Msg left for pharmacy that script was approved by insurance.

## 2021-07-24 NOTE — Telephone Encounter (Signed)
-----   Message from Secundino Ginger sent at 07/24/2021 12:52 PM EST ----- Regarding: [place] for Fentanyl patches sent to chart.

## 2021-07-24 NOTE — Telephone Encounter (Signed)
Gabriel Mendez (Gabriel Mendez) - [id number] fentaNYL 25MCG/HR 72 hr patches     Status: PA Response - Approved  Created: February 7th, 2023 [phone]  Sent: February 7th, 2023

## 2021-07-24 NOTE — Telephone Encounter (Signed)
° °  [employer] is reviewing your PA request. Typically an electronic response will be received within 24-72 hours. To check for an update later, open this request from your dashboard.

## 2021-07-25 LAB — T4: T4, Total: 10.8 ug/dL (ref 4.5–12.0)

## 2021-08-06 NOTE — Progress Notes (Signed)
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  INTERVAL HISTORY: Patient returns to clinic today for further evaluation and consideration of cycle 2 of Taxotere and Cyramza.  He feels significantly improved and is nearly back to his baseline.  His shoulder pain is well controlled with fentanyl patch.  He has no neurologic complaints. He has a good appetite and denies weight loss. He denies any chest pain, hemoptysis, or shortness of breath.  He continues to have chronic cough.  He denies any nausea, vomiting, constipation, or diarrhea.  He has no urinary complaints.  Patient offers no further specific complaints today.  REVIEW OF SYSTEMS:   Review of Systems  Constitutional: Negative.  Negative for fever, malaise/fatigue and weight loss.  HENT: Negative.  Negative for congestion.   Respiratory:  Positive for cough. Negative for hemoptysis and shortness of breath.   Cardiovascular: Negative.  Negative for chest pain and leg swelling.  Gastrointestinal: Negative.  Negative for abdominal pain, heartburn, nausea and vomiting.  Genitourinary: Negative.  Negative for dysuria.  Musculoskeletal: Negative.  Negative for back pain and joint pain.  Skin: Negative.  Negative for rash.  Neurological: Negative.  Negative for dizziness, sensory change, focal weakness, weakness and headaches.  Psychiatric/Behavioral: Negative.  The patient is not nervous/anxious.    As per HPI. Otherwise, a complete review of systems is negative.  PAST MEDICAL HISTORY: Past  Medical History:  Diagnosis Date   Asthma    Cancer associated pain    COPD (chronic obstructive pulmonary disease) ([place])    Diabetes mellitus without complication ([place])    Hypertension    Lung cancer ([place]) 05/2017   Hx Chemo + rad tx's.   Pneumonia     PAST SURGICAL HISTORY: Past Surgical History:  Procedure Laterality Date   ENDOBRONCHIAL ULTRASOUND N/A 05/12/2017   Procedure: ENDOBRONCHIAL ULTRASOUND;  Surgeon: Laverle Hobby, MD;  Location: [facility] ORS;  Service: Pulmonary;  Laterality: N/A;   FRACTURE SURGERY Left    ANKLE X 2   TONSILLECTOMY      FAMILY HISTORY: Family History  Problem Relation Age of Onset   Stroke Mother    Diabetes Mother    Hypertension Mother    Diabetes Father    Hypertension Father    Diabetes Sister    Diabetes Brother    Heart attack Paternal Uncle    Stroke Maternal Grandmother     ADVANCED DIRECTIVES (Y/N):  N  HEALTH MAINTENANCE: Social History   Tobacco Use   Smoking status: Some Days    Packs/day: 0.50    Types: Cigarettes   Smokeless tobacco: Never  Vaping Use   Vaping Use: Never used  Substance Use Topics   Alcohol use: No   Drug use: No     Colonoscopy:  PAP:  Bone density:  Lipid panel:  Allergies  Allergen Reactions   Shrimp [Shellfish Allergy] Nausea And Vomiting    Current Outpatient Medications  Medication Sig Dispense Refill   albuterol (PROVENTIL) (2.5 MG/3ML) 0.083% nebulizer solution Take 2.5 mg by nebulization every 6 (six) hours as needed for wheezing or shortness of breath.  albuterol (VENTOLIN HFA) 108 (90 Base) MCG/ACT inhaler Inhale 2 puffs into the lungs every 6 (six) hours as needed for wheezing or shortness of breath. 1 Inhaler 0   amLODipine (NORVASC) 5 MG tablet Take 10 mg by mouth daily.     ANORO ELLIPTA 62.5-25 MCG/INH AEPB INHALE 1 PUFF INTO THE LUNGS DAILY 60 each 2   atorvastatin (LIPITOR) 20 MG tablet Take 20 mg by mouth daily.     baclofen (LIORESAL) 10 MG tablet TAKE 1  TABLET(10 MG) BY MOUTH THREE TIMES DAILY 90 tablet 1   glipiZIDE (GLUCOTROL) 5 MG tablet Take 10 mg by mouth daily before breakfast.      ibuprofen (ADVIL,MOTRIN) 200 MG tablet Take 400-800 mg every 8 (eight) hours as needed by mouth (for pain.).     lisinopril-hydrochlorothiazide (ZESTORETIC) 20-25 MG tablet Take 1 tablet by mouth daily. 30 tablet 0   metFORMIN (GLUCOPHAGE) 1000 MG tablet Take 1 tablet (1,000 mg total) by mouth 2 (two) times daily. 60 tablet 0   omeprazole (PRILOSEC) 20 MG capsule TAKE 1 CAPSULE(20 MG) BY MOUTH TWICE A DAY 60 capsule 2   ondansetron (ZOFRAN) 8 MG tablet Take 1 tablet (8 mg total) by mouth 2 (two) times daily as needed for refractory nausea / vomiting. 60 tablet 2   prochlorperazine (COMPAZINE) 10 MG tablet Take 1 tablet (10 mg total) by mouth every 6 (six) hours as needed (Nausea or vomiting). 60 tablet 2   TRELEGY ELLIPTA 200-62.5-25 MCG/INH AEPB Take 1 puff by mouth daily.     ALPRAZolam (XANAX) 0.5 MG tablet Take 1 tablet (0.5 mg total) by mouth as needed for anxiety (Before radiation treatments). (Patient not taking: Reported on 02/28/2021) 10 tablet 0   fentaNYL (DURAGESIC) 25 MCG/HR Place 1 patch onto the skin every 3 (three) days. 10 patch 0   HYDROcodone-acetaminophen (NORCO) 5-325 MG tablet Take 2 tablets by mouth every 6 (six) hours as needed for moderate pain. 90 tablet 0   lidocaine (LIDODERM) 5 % Place 1 patch onto the skin daily. Remove & Discard patch within 12 hours or as directed by MD (Patient not taking: Reported on 06/05/2021) 30 patch 0   No current facility-administered medications for this visit.   Facility-Administered Medications Ordered in Other Visits  Medication Dose Route Frequency Provider Last Rate Last Admin   DOCEtaxel (TAXOTERE) 150 mg in sodium chloride 0.9 % 250 mL chemo infusion  75 mg/m2 (Treatment Plan Recorded) Intravenous Once Lloyd Huger, MD 265 mL/hr at 08/07/21 1215 150 mg at 08/07/21 1215     OBJECTIVE: Vitals:   08/07/21 0911  BP: (!) 146/87  Pulse: (!) 101  Temp: 98.6 F (37 C)      Body mass index is 29.41 kg/m.    ECOG FS:0 - Asymptomatic  General: Well-developed, well-nourished, no acute distress. Eyes: Pink conjunctiva, anicteric sclera. HEENT: Normocephalic, moist mucous membranes. Lungs: No audible wheezing or coughing. Heart: Regular rate and rhythm. Abdomen: Soft, nontender, no obvious distention. Musculoskeletal: No edema, cyanosis, or clubbing. Neuro: Alert, answering all questions appropriately. Cranial nerves grossly intact. Skin: No rashes or petechiae noted. Psych: Normal affect.  LAB RESULTS:  Lab Results  Component Value Date   NA 128 (L) 08/07/2021   K 3.8 08/07/2021   CL 89 (L) 08/07/2021   CO2 26 08/07/2021   GLUCOSE 151 (H) 08/07/2021   BUN 11 08/07/2021   CREATININE 0.68 08/07/2021   CALCIUM 9.4 08/07/2021   PROT 7.4 08/07/2021   ALBUMIN 4.1 08/07/2021  AST 30 08/07/2021   ALT 34 08/07/2021   ALKPHOS 162 (H) 08/07/2021   BILITOT 0.3 08/07/2021   GFRNONAA >60 08/07/2021   GFRAA >60 11/12/2019    Lab Results  Component Value Date   WBC 16.9 (H) 08/07/2021   NEUTROABS 14.0 (H) 08/07/2021   HGB 15.9 08/07/2021   HCT 46.3 08/07/2021   MCV 87.0 08/07/2021   PLT 350 08/07/2021     STUDIES: No results found.  ONCOLOGY HISTORY: Patient initially underwent treatment with weekly carboplatinum and Taxol along with daily XRT.  He subsequently completed 1 year of maintenance durvalumab on August 27, 2018. CT scan on September 08, 2019 revealed progressive lymphadenopathy in his right supraclavicular and right thoracic inlet.  PET scan completed on September 27, 2019 confirmed likely recurrence.  Biopsy on October 04, 2019 consistent with squamous cell carcinoma. He completed XRT only for his local recurrence on November 17, 2019.  Repeat CT scan on August 28, 2020 with continued progression of 3 pulmonary nodules in his left and right upper lobe as  well as right lower lobe.  PET scan results completed on September 18, 2020 revealed a right lower lobe nodule has a minimally elevated SUV of 2.9, the right upper lobe nodule has an SUV of 7.7 and additional 11 mm nodule in the left apex as an SUV of 5.3.  Patient has now completed XRT to these lesions.   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  PLAN:    1.  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  PET scan results from July 05, 2021 reviewed independently and report as above with 4.6 cm left upper lobe mass substantially increased in size and hypermetabolism.  No additional sites of hypermetabolic disease are noted.  Given previous XRT to this area, patient has agreed to systemic chemotherapy using Taxotere and Cyramza every 3 weeks.  Hold Udenyca for cycle 1, but this may be necessary for subsequent cycles. Proceed with cycle 2 of treatment today.  Return to clinic in 3 weeks for further evaluation and consideration of cycle 3.  Repeat PET scan after 4 cycles.   2.  Hypertension: Chronic and unchanged.  Continue evaluation and treatment per primary care. 3.  Cough: Chronic and unchanged.  Continue OTC medications as needed.  Patient has also been instructed to use his inhaler more frequently. 4.  Shoulder pain: Significantly improved with 25 mcg fentanyl patch.  Continue hydrocodone as needed.   5.  Hyperglycemia: Patient continues to have poor blood glucose control.  Continue evaluation and treatment per primary care. 6.  Reflux: Resolved.  Continue omeprazole as prescribed.   7.  Nausea and vomiting: Resolved.  Patient has been instructed to take his antiemetics regularly after each chemotherapy cycle.   Patient expressed understanding and was in agreement with  this plan. He also understands that He can call clinic at any time with any questions, concerns, or complaints.    Cancer Staging  Squamous cell lung cancer, left [facility]) Staging form: Lung, AJCC 8th Edition - Clinical stage from 05/16/2017: Stage IIIB (cT3, cN2, cM0) - Signed by Lloyd Huger, MD on 05/16/2017   Lloyd Huger, MD   08/07/2021 12:19 PM

## 2021-08-07 ENCOUNTER — Inpatient Hospital Stay: Payer: 59

## 2021-08-07 ENCOUNTER — Inpatient Hospital Stay (HOSPITAL_BASED_OUTPATIENT_CLINIC_OR_DEPARTMENT_OTHER): Payer: 59 | Admitting: Oncology

## 2021-08-07 ENCOUNTER — Other Ambulatory Visit: Payer: Self-pay

## 2021-08-07 ENCOUNTER — Encounter: Payer: Self-pay | Admitting: Oncology

## 2021-08-07 VITALS — BP 146/87 | HR 101 | Temp 98.6°F | Wt 193.4 lb

## 2021-08-07 VITALS — BP 145/84 | HR 101

## 2021-08-07 DIAGNOSIS — C3412 Malignant neoplasm of upper lobe, left bronchus or lung: Secondary | ICD-10-CM | POA: Diagnosis not present

## 2021-08-07 DIAGNOSIS — C3492 Malignant neoplasm of unspecified part of left bronchus or lung: Secondary | ICD-10-CM | POA: Diagnosis not present

## 2021-08-07 LAB — URINALYSIS, DIPSTICK ONLY
Bilirubin Urine: NEGATIVE
Glucose, UA: NEGATIVE mg/dL
Ketones, ur: NEGATIVE mg/dL
Nitrite: NEGATIVE
Protein, ur: 100 mg/dL — AB
Specific Gravity, Urine: 1.012 (ref 1.005–1.030)
pH: 6 (ref 5.0–8.0)

## 2021-08-07 LAB — CBC WITH DIFFERENTIAL/PLATELET
Abs Immature Granulocytes: 0.32 10*3/uL — ABNORMAL HIGH (ref 0.00–0.07)
Basophils Absolute: 0.2 10*3/uL — ABNORMAL HIGH (ref 0.0–0.1)
Basophils Relative: 1 %
Eosinophils Absolute: 0.1 10*3/uL (ref 0.0–0.5)
Eosinophils Relative: 1 %
HCT: 46.3 % (ref 39.0–52.0)
Hemoglobin: 15.9 g/dL (ref 13.0–17.0)
Immature Granulocytes: 2 %
Lymphocytes Relative: 6 %
Lymphs Abs: 1.1 10*3/uL (ref 0.7–4.0)
MCH: 29.9 pg (ref 26.0–34.0)
MCHC: 34.3 g/dL (ref 30.0–36.0)
MCV: 87 fL (ref 80.0–100.0)
Monocytes Absolute: 1.2 10*3/uL — ABNORMAL HIGH (ref 0.1–1.0)
Monocytes Relative: 7 %
Neutro Abs: 14 10*3/uL — ABNORMAL HIGH (ref 1.7–7.7)
Neutrophils Relative %: 83 %
Platelets: 350 10*3/uL (ref 150–400)
RBC: 5.32 MIL/uL (ref 4.22–5.81)
RDW: 13.1 % (ref 11.5–15.5)
WBC: 16.9 10*3/uL — ABNORMAL HIGH (ref 4.0–10.5)
nRBC: 0 % (ref 0.0–0.2)

## 2021-08-07 LAB — COMPREHENSIVE METABOLIC PANEL
ALT: 34 U/L (ref 0–44)
AST: 30 U/L (ref 15–41)
Albumin: 4.1 g/dL (ref 3.5–5.0)
Alkaline Phosphatase: 162 U/L — ABNORMAL HIGH (ref 38–126)
Anion gap: 13 (ref 5–15)
BUN: 11 mg/dL (ref 8–23)
CO2: 26 mmol/L (ref 22–32)
Calcium: 9.4 mg/dL (ref 8.9–10.3)
Chloride: 89 mmol/L — ABNORMAL LOW (ref 98–111)
Creatinine, Ser: 0.68 mg/dL (ref 0.61–1.24)
GFR, Estimated: >60 mL/min (ref >60)
Glucose, Bld: 151 mg/dL — ABNORMAL HIGH (ref 70–99)
Potassium: 3.8 mmol/L (ref 3.5–5.1)
Sodium: 128 mmol/L — ABNORMAL LOW (ref 135–145)
Total Bilirubin: 0.3 mg/dL (ref 0.3–1.2)
Total Protein: 7.4 g/dL (ref 6.5–8.1)

## 2021-08-07 LAB — TSH: TSH: 3.409 u[IU]/mL (ref 0.350–4.500)

## 2021-08-07 MED ORDER — SODIUM CHLORIDE 0.9 % IV SOLN
10.0000 mg | Freq: Once | INTRAVENOUS | Status: AC
  Administered 2021-08-07: 10 mg via INTRAVENOUS
  Filled 2021-08-07: qty 10

## 2021-08-07 MED ORDER — SODIUM CHLORIDE 0.9 % IV SOLN
10.0000 mg/kg | Freq: Once | INTRAVENOUS | Status: AC
  Administered 2021-08-07: 900 mg via INTRAVENOUS
  Filled 2021-08-07: qty 90

## 2021-08-07 MED ORDER — SODIUM CHLORIDE 0.9 % IV SOLN
75.0000 mg/m2 | Freq: Once | INTRAVENOUS | Status: AC
  Administered 2021-08-07: 150 mg via INTRAVENOUS
  Filled 2021-08-07: qty 15

## 2021-08-07 MED ORDER — ACETAMINOPHEN 325 MG PO TABS
650.0000 mg | ORAL_TABLET | Freq: Once | ORAL | Status: AC
  Administered 2021-08-07: 650 mg via ORAL
  Filled 2021-08-07: qty 2

## 2021-08-07 MED ORDER — SODIUM CHLORIDE 0.9 % IV SOLN
10.0000 mg/kg | Freq: Once | INTRAVENOUS | Status: DC
  Filled 2021-08-07: qty 90

## 2021-08-07 MED ORDER — FENTANYL 25 MCG/HR TD PT72: 1.0000 | MEDICATED_PATCH | TRANSDERMAL | 0 refills | Status: DC

## 2021-08-07 MED ORDER — DIPHENHYDRAMINE HCL 50 MG/ML IJ SOLN
50.0000 mg | Freq: Once | INTRAMUSCULAR | Status: AC
  Administered 2021-08-07: 50 mg via INTRAVENOUS
  Filled 2021-08-07: qty 1

## 2021-08-07 MED ORDER — HYDROCODONE-ACETAMINOPHEN 5-325 MG PO TABS: 2.0000 | ORAL_TABLET | Freq: Four times a day (QID) | ORAL | 0 refills | Status: DC | PRN

## 2021-08-07 MED ORDER — SODIUM CHLORIDE 0.9 % IV SOLN
Freq: Once | INTRAVENOUS | Status: AC
  Filled 2021-08-07: qty 250

## 2021-08-07 MED ORDER — FAMOTIDINE IN NACL 20-0.9 MG/50ML-% IV SOLN
20.0000 mg | Freq: Once | INTRAVENOUS | Status: AC
  Administered 2021-08-07: 20 mg via INTRAVENOUS
  Filled 2021-08-07: qty 50

## 2021-08-07 NOTE — Progress Notes (Signed)
Pt needs refill on Fentanyl patch and Hydrocodone. Pharmacy did not have enough patches his last refill.

## 2021-08-07 NOTE — Progress Notes (Signed)
MD. Has reviewed all vital signs and labs. Ok to proceed with treatment per Dr. Grayland Ormond.  Asked pharmacy to change Cyramza to infuse over 60 minutes instead of 30, because patient had a reaction to his last infusion (07/17/21) and medication had to be stopped and restarted at lower rate. Cyramza was changed to infuse over 60 minutes.  Cyramza was tolerated well over 60 minutes.

## 2021-08-07 NOTE — Patient Instructions (Signed)
[facility]  Discharge Instructions: ?Thank you for choosing [facility] to provide your oncology and hematology care.  ?If you have a lab appointment with the Cancer Center, please go directly to the Cancer Center and check in at the registration area. ? ?Wear comfortable clothing and clothing appropriate for easy access to any Portacath or PICC line.  ? ?We strive to give you quality time with your provider. You may need to reschedule your appointment if you arrive late (15 or more minutes).  Arriving late affects you and other patients whose appointments are after yours.  Also, if you miss three or more appointments without notifying the office, you may be dismissed from the clinic at the provider?s discretion.    ?  ?For prescription refill requests, have your pharmacy contact our office and allow 72 hours for refills to be completed.   ? ?  ?To help prevent nausea and vomiting after your treatment, we encourage you to take your nausea medication as directed. ? ?BELOW ARE SYMPTOMS THAT SHOULD BE REPORTED IMMEDIATELY: ?*FEVER GREATER THAN 100.4 F (38 ?C) OR HIGHER ?*CHILLS OR SWEATING ?*NAUSEA AND VOMITING THAT IS NOT CONTROLLED WITH YOUR NAUSEA MEDICATION ?*UNUSUAL SHORTNESS OF BREATH ?*UNUSUAL BRUISING OR BLEEDING ?*URINARY PROBLEMS (pain or burning when urinating, or frequent urination) ?*BOWEL PROBLEMS (unusual diarrhea, constipation, pain near the anus) ?TENDERNESS IN MOUTH AND THROAT WITH OR WITHOUT PRESENCE OF ULCERS (sore throat, sores in mouth, or a toothache) ?UNUSUAL RASH, SWELLING OR PAIN  ?UNUSUAL VAGINAL DISCHARGE OR ITCHING  ? ?Items with * indicate a potential emergency and should be followed up as soon as possible or go to the Emergency Department if any problems should occur. ? ?Please show the CHEMOTHERAPY ALERT CARD or IMMUNOTHERAPY ALERT CARD at check-in to the Emergency Department and triage nurse. ? ?Should you have questions after your visit  or need to cancel or reschedule your appointment, please contact [facility]  [phone] and follow the prompts.  Office hours are 8:00 a.m. to 4:30 p.m. Monday - Friday. Please note that voicemails left after 4:00 p.m. may not be returned until the following business day.  We are closed weekends and major holidays. You have access to a nurse at all times for urgent questions. Please call the main number to the clinic [phone] and follow the prompts. ? ?For any non-urgent questions, you may also contact your provider using MyChart. We now offer e-Visits for anyone 18 and older to request care online for non-urgent symptoms. For details visit [email or web address]. ?  ?Also download the MyChart app! Go to the app store, search "MyChart", open the app, select [facility], and log in with your MyChart username and password. ? ?Due to Covid, a mask is required upon entering the hospital/clinic. If you do not have a mask, one will be given to you upon arrival. For doctor visits, patients may have 1 support person aged 18 or older with them. For treatment visits, patients cannot have anyone with them due to current Covid guidelines and our immunocompromised population.  ?

## 2021-08-08 LAB — T4: T4, Total: 9.6 ug/dL (ref 4.5–12.0)

## 2021-08-24 NOTE — Progress Notes (Unsigned)
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  INTERVAL HISTORY: Patient returns to clinic today for further evaluation and consideration of cycle 2 of Taxotere and Cyramza.  He feels significantly improved and is nearly back to his baseline.  His shoulder pain is well controlled with fentanyl patch.  He has no neurologic complaints. He has a good appetite and denies weight loss. He denies any chest pain, hemoptysis, or shortness of breath.  He continues to have chronic cough.  He denies any nausea, vomiting, constipation, or diarrhea.  He has no urinary complaints.  Patient offers no further specific complaints today.  REVIEW OF SYSTEMS:   Review of Systems  Constitutional: Negative.  Negative for fever, malaise/fatigue and weight loss.  HENT: Negative.  Negative for congestion.   Respiratory:  Positive for cough. Negative for hemoptysis and shortness of breath.   Cardiovascular: Negative.  Negative for chest pain and leg swelling.  Gastrointestinal: Negative.  Negative for abdominal pain, heartburn, nausea and vomiting.  Genitourinary: Negative.  Negative for dysuria.  Musculoskeletal: Negative.  Negative for back pain and joint pain.  Skin: Negative.  Negative for rash.  Neurological: Negative.  Negative for dizziness, sensory change, focal weakness, weakness and headaches.  Psychiatric/Behavioral: Negative.  The patient is not nervous/anxious.    As per HPI. Otherwise, a complete review of systems is negative.  PAST MEDICAL HISTORY: Past  Medical History:  Diagnosis Date   Asthma    Cancer associated pain    COPD (chronic obstructive pulmonary disease) ([place])    Diabetes mellitus without complication ([place])    Hypertension    Lung cancer ([place]) 05/2017   Hx Chemo + rad tx's.   Pneumonia     PAST SURGICAL HISTORY: Past Surgical History:  Procedure Laterality Date   ENDOBRONCHIAL ULTRASOUND N/A 05/12/2017   Procedure: ENDOBRONCHIAL ULTRASOUND;  Surgeon: Laverle Hobby, MD;  Location: [facility] ORS;  Service: Pulmonary;  Laterality: N/A;   FRACTURE SURGERY Left    ANKLE X 2   TONSILLECTOMY      FAMILY HISTORY: Family History  Problem Relation Age of Onset   Stroke Mother    Diabetes Mother    Hypertension Mother    Diabetes Father    Hypertension Father    Diabetes Sister    Diabetes Brother    Heart attack Paternal Uncle    Stroke Maternal Grandmother     ADVANCED DIRECTIVES (Y/N):  N  HEALTH MAINTENANCE: Social History   Tobacco Use   Smoking status: Some Days    Packs/day: 0.50    Types: Cigarettes   Smokeless tobacco: Never  Vaping Use   Vaping Use: Never used  Substance Use Topics   Alcohol use: No   Drug use: No     Colonoscopy:  PAP:  Bone density:  Lipid panel:  Allergies  Allergen Reactions   Shrimp [Shellfish Allergy] Nausea And Vomiting    Current Outpatient Medications  Medication Sig Dispense Refill   albuterol (PROVENTIL) (2.5 MG/3ML) 0.083% nebulizer solution Take 2.5 mg by nebulization every 6 (six) hours as needed for wheezing or shortness of breath.  albuterol (VENTOLIN HFA) 108 (90 Base) MCG/ACT inhaler Inhale 2 puffs into the lungs every 6 (six) hours as needed for wheezing or shortness of breath. 1 Inhaler 0   ALPRAZolam (XANAX) 0.5 MG tablet Take 1 tablet (0.5 mg total) by mouth as needed for anxiety (Before radiation treatments). (Patient not taking: Reported on 02/28/2021) 10 tablet 0   amLODipine (NORVASC) 5 MG tablet Take 10 mg by mouth daily.     ANORO  ELLIPTA 62.5-25 MCG/INH AEPB INHALE 1 PUFF INTO THE LUNGS DAILY 60 each 2   atorvastatin (LIPITOR) 20 MG tablet Take 20 mg by mouth daily.     baclofen (LIORESAL) 10 MG tablet TAKE 1 TABLET(10 MG) BY MOUTH THREE TIMES DAILY 90 tablet 1   fentaNYL (DURAGESIC) 25 MCG/HR Place 1 patch onto the skin every 3 (three) days. 10 patch 0   glipiZIDE (GLUCOTROL) 5 MG tablet Take 10 mg by mouth daily before breakfast.      HYDROcodone-acetaminophen (NORCO) 5-325 MG tablet Take 2 tablets by mouth every 6 (six) hours as needed for moderate pain. 90 tablet 0   ibuprofen (ADVIL,MOTRIN) 200 MG tablet Take 400-800 mg every 8 (eight) hours as needed by mouth (for pain.).     lidocaine (LIDODERM) 5 % Place 1 patch onto the skin daily. Remove & Discard patch within 12 hours or as directed by MD (Patient not taking: Reported on 06/05/2021) 30 patch 0   lisinopril-hydrochlorothiazide (ZESTORETIC) 20-25 MG tablet Take 1 tablet by mouth daily. 30 tablet 0   metFORMIN (GLUCOPHAGE) 1000 MG tablet Take 1 tablet (1,000 mg total) by mouth 2 (two) times daily. 60 tablet 0   omeprazole (PRILOSEC) 20 MG capsule TAKE 1 CAPSULE(20 MG) BY MOUTH TWICE A DAY 60 capsule 2   ondansetron (ZOFRAN) 8 MG tablet Take 1 tablet (8 mg total) by mouth 2 (two) times daily as needed for refractory nausea / vomiting. 60 tablet 2   prochlorperazine (COMPAZINE) 10 MG tablet Take 1 tablet (10 mg total) by mouth every 6 (six) hours as needed (Nausea or vomiting). 60 tablet 2   TRELEGY ELLIPTA 200-62.5-25 MCG/INH AEPB Take 1 puff by mouth daily.     No current facility-administered medications for this visit.    OBJECTIVE: There were no vitals filed for this visit.     There is no height or weight on file to calculate BMI.    ECOG FS:0 - Asymptomatic  General: Well-developed, well-nourished, no acute distress. Eyes: Pink conjunctiva, anicteric sclera. HEENT: Normocephalic, moist mucous membranes. Lungs: No audible wheezing or coughing. Heart:  Regular rate and rhythm. Abdomen: Soft, nontender, no obvious distention. Musculoskeletal: No edema, cyanosis, or clubbing. Neuro: Alert, answering all questions appropriately. Cranial nerves grossly intact. Skin: No rashes or petechiae noted. Psych: Normal affect.  LAB RESULTS:  Lab Results  Component Value Date   NA 128 (L) 08/07/2021   K 3.8 08/07/2021   CL 89 (L) 08/07/2021   CO2 26 08/07/2021   GLUCOSE 151 (H) 08/07/2021   BUN 11 08/07/2021   CREATININE 0.68 08/07/2021   CALCIUM 9.4 08/07/2021   PROT 7.4 08/07/2021   ALBUMIN 4.1 08/07/2021   AST 30 08/07/2021   ALT 34 08/07/2021   ALKPHOS 162 (H) 08/07/2021   BILITOT 0.3 08/07/2021   GFRNONAA >60 08/07/2021   GFRAA >60 11/12/2019    Lab Results  Component Value Date   WBC 16.9 (H) 08/07/2021   NEUTROABS 14.0 (H) 08/07/2021   HGB 15.9 08/07/2021   HCT 46.3 08/07/2021  MCV 87.0 08/07/2021   PLT 350 08/07/2021     STUDIES: No results found.  ONCOLOGY HISTORY: Patient initially underwent treatment with weekly carboplatinum and Taxol along with daily XRT.  He subsequently completed 1 year of maintenance durvalumab on August 27, 2018. CT scan on September 08, 2019 revealed progressive lymphadenopathy in his right supraclavicular and right thoracic inlet.  PET scan completed on September 27, 2019 confirmed likely recurrence.  Biopsy on October 04, 2019 consistent with squamous cell carcinoma. He completed XRT only for his local recurrence on November 17, 2019.  Repeat CT scan on August 28, 2020 with continued progression of 3 pulmonary nodules in his left and right upper lobe as well as right lower lobe.  PET scan results completed on September 18, 2020 revealed a right lower lobe nodule has a minimally elevated SUV of 2.9, the right upper lobe nodule has an SUV of 7.7 and additional 11 mm nodule in the left apex as an SUV of 5.3.  Patient has now completed XRT to these lesions.   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  PLAN:    1.  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  PET scan results from July 05, 2021 reviewed independently and report as above with 4.6 cm left upper lobe mass substantially increased in size and hypermetabolism.  No additional sites of hypermetabolic disease are noted.  Given previous XRT to this area, patient has agreed to systemic chemotherapy using Taxotere and Cyramza every 3 weeks.  Hold Udenyca for cycle 1, but this may be necessary for subsequent cycles. Proceed with cycle 2 of treatment today.  Return to clinic in 3 weeks for further evaluation and consideration of cycle 3.  Repeat PET scan after 4 cycles.   2.  Hypertension: Chronic and unchanged.  Continue evaluation and treatment per primary care. 3.  Cough: Chronic and unchanged.  Continue OTC medications as needed.  Patient has also been instructed to use his inhaler more frequently. 4.  Shoulder pain: Significantly improved with 25 mcg fentanyl patch.  Continue hydrocodone as needed.   5.  Hyperglycemia: Patient continues to have poor blood glucose control.  Continue evaluation and treatment per primary care. 6.  Reflux: Resolved.  Continue omeprazole as prescribed.   7.  Nausea and vomiting: Resolved.  Patient has been instructed to take his antiemetics regularly after each chemotherapy cycle.   Patient expressed understanding and was in agreement with this plan. He also understands that He can call clinic at any time with any questions, concerns, or complaints.    Cancer Staging  Squamous cell lung cancer, left [facility]) Staging form: Lung, AJCC 8th Edition - Clinical stage from 05/16/2017: Stage IIIB (cT3, cN2, cM0) - Signed by Lloyd Huger, MD on 05/16/2017   Lloyd Huger, MD   08/24/2021 8:35  AM

## 2021-08-28 ENCOUNTER — Encounter: Payer: Self-pay | Admitting: Oncology

## 2021-08-28 ENCOUNTER — Inpatient Hospital Stay: Payer: 59 | Attending: Oncology

## 2021-08-28 ENCOUNTER — Other Ambulatory Visit: Payer: Self-pay

## 2021-08-28 ENCOUNTER — Inpatient Hospital Stay: Payer: 59

## 2021-08-28 ENCOUNTER — Inpatient Hospital Stay (HOSPITAL_BASED_OUTPATIENT_CLINIC_OR_DEPARTMENT_OTHER): Payer: 59 | Admitting: Oncology

## 2021-08-28 VITALS — BP 139/80 | HR 103 | Temp 98.7°F | Wt 189.9 lb

## 2021-08-28 VITALS — BP 137/83 | HR 92

## 2021-08-28 DIAGNOSIS — C3412 Malignant neoplasm of upper lobe, left bronchus or lung: Secondary | ICD-10-CM | POA: Insufficient documentation

## 2021-08-28 DIAGNOSIS — C3481 Malignant neoplasm of overlapping sites of right bronchus and lung: Secondary | ICD-10-CM | POA: Diagnosis not present

## 2021-08-28 DIAGNOSIS — M25519 Pain in unspecified shoulder: Secondary | ICD-10-CM | POA: Insufficient documentation

## 2021-08-28 DIAGNOSIS — F17210 Nicotine dependence, cigarettes, uncomplicated: Secondary | ICD-10-CM | POA: Diagnosis not present

## 2021-08-28 DIAGNOSIS — G629 Polyneuropathy, unspecified: Secondary | ICD-10-CM | POA: Insufficient documentation

## 2021-08-28 DIAGNOSIS — R739 Hyperglycemia, unspecified: Secondary | ICD-10-CM | POA: Diagnosis not present

## 2021-08-28 DIAGNOSIS — I10 Essential (primary) hypertension: Secondary | ICD-10-CM | POA: Insufficient documentation

## 2021-08-28 DIAGNOSIS — Z5111 Encounter for antineoplastic chemotherapy: Secondary | ICD-10-CM | POA: Diagnosis present

## 2021-08-28 DIAGNOSIS — C3492 Malignant neoplasm of unspecified part of left bronchus or lung: Secondary | ICD-10-CM

## 2021-08-28 DIAGNOSIS — R053 Chronic cough: Secondary | ICD-10-CM | POA: Insufficient documentation

## 2021-08-28 DIAGNOSIS — E871 Hypo-osmolality and hyponatremia: Secondary | ICD-10-CM | POA: Diagnosis not present

## 2021-08-28 LAB — COMPREHENSIVE METABOLIC PANEL
ALT: 15 U/L (ref 0–44)
AST: 20 U/L (ref 15–41)
Albumin: 3.8 g/dL (ref 3.5–5.0)
Alkaline Phosphatase: 104 U/L (ref 38–126)
Anion gap: 11 (ref 5–15)
BUN: 12 mg/dL (ref 8–23)
CO2: 29 mmol/L (ref 22–32)
Calcium: 9.4 mg/dL (ref 8.9–10.3)
Chloride: 90 mmol/L — ABNORMAL LOW (ref 98–111)
Creatinine, Ser: 0.73 mg/dL (ref 0.61–1.24)
GFR, Estimated: >60 mL/min (ref >60)
Glucose, Bld: 148 mg/dL — ABNORMAL HIGH (ref 70–99)
Potassium: 3.6 mmol/L (ref 3.5–5.1)
Sodium: 130 mmol/L — ABNORMAL LOW (ref 135–145)
Total Bilirubin: 0.6 mg/dL (ref 0.3–1.2)
Total Protein: 7.4 g/dL (ref 6.5–8.1)

## 2021-08-28 LAB — CBC WITH DIFFERENTIAL/PLATELET
Abs Immature Granulocytes: 0.26 10*3/uL — ABNORMAL HIGH (ref 0.00–0.07)
Basophils Absolute: 0.3 10*3/uL — ABNORMAL HIGH (ref 0.0–0.1)
Basophils Relative: 2 %
Eosinophils Absolute: 0.1 10*3/uL (ref 0.0–0.5)
Eosinophils Relative: 0 %
HCT: 47.7 % (ref 39.0–52.0)
Hemoglobin: 15.6 g/dL (ref 13.0–17.0)
Immature Granulocytes: 2 %
Lymphocytes Relative: 6 %
Lymphs Abs: 1 10*3/uL (ref 0.7–4.0)
MCH: 28.8 pg (ref 26.0–34.0)
MCHC: 32.7 g/dL (ref 30.0–36.0)
MCV: 88.2 fL (ref 80.0–100.0)
Monocytes Absolute: 1.2 10*3/uL — ABNORMAL HIGH (ref 0.1–1.0)
Monocytes Relative: 7 %
Neutro Abs: 13.7 10*3/uL — ABNORMAL HIGH (ref 1.7–7.7)
Neutrophils Relative %: 83 %
Platelets: 376 10*3/uL (ref 150–400)
RBC: 5.41 MIL/uL (ref 4.22–5.81)
RDW: 13.3 % (ref 11.5–15.5)
WBC: 16.5 10*3/uL — ABNORMAL HIGH (ref 4.0–10.5)
nRBC: 0 % (ref 0.0–0.2)

## 2021-08-28 LAB — URINALYSIS, DIPSTICK ONLY
Bilirubin Urine: NEGATIVE
Glucose, UA: NEGATIVE mg/dL
Ketones, ur: NEGATIVE mg/dL
Nitrite: NEGATIVE
Protein, ur: >300 mg/dL — AB
Specific Gravity, Urine: 1.01 (ref 1.005–1.030)
pH: 6.5 (ref 5.0–8.0)

## 2021-08-28 LAB — TSH: TSH: 5.989 u[IU]/mL — ABNORMAL HIGH (ref 0.350–4.500)

## 2021-08-28 MED ORDER — SODIUM CHLORIDE 0.9 % IV SOLN
67.0000 mg/m2 | Freq: Once | INTRAVENOUS | Status: AC
  Administered 2021-08-28: 140 mg via INTRAVENOUS
  Filled 2021-08-28: qty 14

## 2021-08-28 MED ORDER — FENTANYL 25 MCG/HR TD PT72: 1.0000 | MEDICATED_PATCH | TRANSDERMAL | 0 refills | Status: DC

## 2021-08-28 MED ORDER — FAMOTIDINE IN NACL 20-0.9 MG/50ML-% IV SOLN
20.0000 mg | Freq: Once | INTRAVENOUS | Status: AC
  Administered 2021-08-28: 20 mg via INTRAVENOUS
  Filled 2021-08-28: qty 50

## 2021-08-28 MED ORDER — SODIUM CHLORIDE 0.9 % IV SOLN
10.0000 mg | Freq: Once | INTRAVENOUS | Status: AC
  Administered 2021-08-28: 10 mg via INTRAVENOUS
  Filled 2021-08-28: qty 10

## 2021-08-28 MED ORDER — HEPARIN SOD (PORK) LOCK FLUSH 100 UNIT/ML IV SOLN
500.0000 [IU] | Freq: Once | INTRAVENOUS | Status: DC | PRN
  Filled 2021-08-28: qty 5

## 2021-08-28 MED ORDER — ACETAMINOPHEN 325 MG PO TABS
650.0000 mg | ORAL_TABLET | Freq: Once | ORAL | Status: AC
  Administered 2021-08-28: 650 mg via ORAL
  Filled 2021-08-28: qty 2

## 2021-08-28 MED ORDER — HYDROCODONE-ACETAMINOPHEN 5-325 MG PO TABS: 2.0000 | ORAL_TABLET | Freq: Four times a day (QID) | ORAL | 0 refills | Status: DC | PRN

## 2021-08-28 MED ORDER — SODIUM CHLORIDE 0.9 % IV SOLN
10.0000 mg/kg | Freq: Once | INTRAVENOUS | Status: AC
  Administered 2021-08-28: 900 mg via INTRAVENOUS
  Filled 2021-08-28: qty 50

## 2021-08-28 MED ORDER — SODIUM CHLORIDE 0.9 % IV SOLN
Freq: Once | INTRAVENOUS | Status: AC
  Filled 2021-08-28: qty 250

## 2021-08-28 MED ORDER — DIPHENHYDRAMINE HCL 50 MG/ML IJ SOLN
50.0000 mg | Freq: Once | INTRAMUSCULAR | Status: AC
  Administered 2021-08-28: 50 mg via INTRAVENOUS
  Filled 2021-08-28: qty 1

## 2021-08-28 NOTE — Patient Instructions (Addendum)
[facility] CANCER CTR AT [place]  Discharge Instructions: ?Thank you for choosing [place] to provide your oncology and hematology care.  ?If you have a lab appointment with the [facility], please go directly to the [facility] and check in at the registration area. ? ?Wear comfortable clothing and clothing appropriate for easy access to any Portacath or PICC line.  ? ?We strive to give you quality time with your provider. You may need to reschedule your appointment if you arrive late (15 or more minutes).  Arriving late affects you and other patients whose appointments are after yours.  Also, if you miss three or more appointments without notifying the office, you may be dismissed from the clinic at the provider?s discretion.    ?  ?For prescription refill requests, have your pharmacy contact our office and allow 72 hours for refills to be completed.   ? ?Today you received the following chemotherapy and/or immunotherapy agents:  Cyramza, Taxotere. ?  ?To help prevent nausea and vomiting after your treatment, we encourage you to take your nausea medication as directed. ? ?BELOW ARE SYMPTOMS THAT SHOULD BE REPORTED IMMEDIATELY: ?*FEVER GREATER THAN 100.4 F (38 ?C) OR HIGHER ?*CHILLS OR SWEATING ?*NAUSEA AND VOMITING THAT IS NOT CONTROLLED WITH YOUR NAUSEA MEDICATION ?*UNUSUAL SHORTNESS OF BREATH ?*UNUSUAL BRUISING OR BLEEDING ?*URINARY PROBLEMS (pain or burning when urinating, or frequent urination) ?*BOWEL PROBLEMS (unusual diarrhea, constipation, pain near the anus) ?TENDERNESS IN MOUTH AND THROAT WITH OR WITHOUT PRESENCE OF ULCERS (sore throat, sores in mouth, or a toothache) ?UNUSUAL RASH, SWELLING OR PAIN  ?UNUSUAL VAGINAL DISCHARGE OR ITCHING  ? ?Items with * indicate a potential emergency and should be followed up as soon as possible or go to the Emergency Department if any problems should occur. ? ?Please show the CHEMOTHERAPY ALERT CARD or IMMUNOTHERAPY ALERT CARD at  check-in to the Emergency Department and triage nurse. ? ?Should you have questions after your visit or need to cancel or reschedule your appointment, please contact [facility] CANCER [place] AT [place]  [phone] and follow the prompts.  Office hours are 8:00 a.m. to 4:30 p.m. Monday - Friday. Please note that voicemails left after 4:00 p.m. may not be returned until the following business day.  We are closed weekends and major holidays. You have access to a nurse at all times for urgent questions. Please call the main number to the clinic [phone] and follow the prompts. ? ?For any non-urgent questions, you may also contact your provider using MyChart. We now offer e-Visits for anyone 75 and older to request care online for non-urgent symptoms. For details visit [email or web address]. ?  ?Also download the MyChart app! Go to the app store, search "MyChart", open the app, select [place], and log in with your MyChart username and password. ? ?Due to Covid, a mask is required upon entering the hospital/clinic. If you do not have a mask, one will be given to you upon arrival. For doctor visits, patients may have 1 support person aged 36 or older with them. For treatment visits, patients cannot have anyone with them due to current Covid guidelines and our immunocompromised population.  ?

## 2021-08-28 NOTE — Progress Notes (Signed)
Refill on the Norco and he wants to know if he can up the dose. He thinks his body is getting use to the medication and its not working as good. ? ?Pt also needs a refill on the fentanyl patches. ? ?Pt has had 2 nose bleeds and the first one it took 20 mins to stop. ? ?Pt also is having pain in his tongue and extreme sensitivity. Recommended that pt try OTC Magic mouth wash and I would ask Dr. Oleta Mouse about Prescription mouth wash. ? ?Pt is also having extreme neuropathy at certain times. 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 The pain gets so bad at times that he is having to crawl on his hands and knees instead of walking on his feet.  ? ?Pt also asked about time off of work with the extreme fatigue that he is experiencing. He just does not know exactly what days that he needs off of work. I told pt the best option would be to apply for FMLA and bring [place] the paperwork and we would fill that out. I explained to pt that this might be his best option VS [place] not knowing exactly what days he would need off. ? ?

## 2021-08-28 NOTE — Progress Notes (Signed)
All labs reviewed by MD. Urine protein is >300. Ok to proceed with treatment today per Dr. Grayland Ormond. ? ?Cyramza run at full rate over 30 minutes today without incident. Patient tolerated well. ?

## 2021-08-29 LAB — T4: T4, Total: 9.6 ug/dL (ref 4.5–12.0)

## 2021-09-16 NOTE — Progress Notes (Signed)
?[place]  ?[phone] [phone] ? ?ID: Gabriel Mendez OB: 1960-07-19  MR#: [id number]  FBP#:[id number] ? ?Patient Care Team: ?Lynnell Jude, MD as PCP - General (Family Medicine) ?Lloyd Huger, MD as Consulting Physician (Oncology) ?[name], MD as Referring Physician (Radiation Oncology) ?Telford Nab, RN as Equities trader ? ? 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 ? ?INTERVAL HISTORY: Patient returns to clinic today for further evaluation and consideration of cycle 4 of Taxotere and Cyramza.  He continues to have right shoulder and neck pain as well as a peripheral neuropathy that is unchanged despite discontinuing fentanyl patch.  He otherwise feels well. He has no other neurologic complaints.  He has a good appetite and denies weight loss. He denies any chest pain, hemoptysis, or shortness of breath.  He continues to have chronic cough.  He denies any nausea, vomiting, constipation, or diarrhea.  He has no urinary complaints.  Patient offers no further specific complaints today. ? ?REVIEW OF SYSTEMS:   ?Review of Systems  ?Constitutional: Negative.  Negative for fever, malaise/fatigue and weight loss.  ?HENT: Negative.  Negative for congestion.   ?Respiratory: Negative.  Negative for cough, hemoptysis and shortness of breath.   ?Cardiovascular: Negative.  Negative for chest pain and leg swelling.  ?Gastrointestinal: Negative.  Negative for abdominal pain, heartburn, nausea and vomiting.  ?Genitourinary: Negative.  Negative for dysuria.  ?Musculoskeletal:  Positive for joint pain. Negative for back pain.  ?Skin: Negative.  Negative for rash.  ?Neurological:  Positive for sensory change. Negative for dizziness, focal weakness, weakness and headaches.  ?Psychiatric/Behavioral: Negative.  The patient is not nervous/anxious.   ? ?As per HPI. Otherwise, a complete review of systems  is negative. ? ?PAST MEDICAL HISTORY: ?Past Medical History:  ?Diagnosis Date  ? Asthma   ? Cancer associated pain   ? COPD (chronic obstructive pulmonary disease) ([place])   ? Diabetes mellitus without complication ([place])   ? Hypertension   ? Lung cancer ([place]) 05/2017  ? Hx Chemo + rad tx's.  ? Pneumonia   ? ? ?PAST SURGICAL HISTORY: ?Past Surgical History:  ?Procedure Laterality Date  ? ENDOBRONCHIAL ULTRASOUND N/A 05/12/2017  ? Procedure: ENDOBRONCHIAL ULTRASOUND;  Surgeon: Laverle Hobby, MD;  Location: [facility] ORS;  Service: Pulmonary;  Laterality: N/A;  ? FRACTURE SURGERY Left   ? ANKLE X 2  ? TONSILLECTOMY    ? ? ?FAMILY HISTORY: ?Family History  ?Problem Relation Age of Onset  ? Stroke Mother   ? Diabetes Mother   ? Hypertension Mother   ? Diabetes Father   ? Hypertension Father   ? Diabetes Sister   ? Diabetes Brother   ? Heart attack Paternal Uncle   ? Stroke Maternal Grandmother   ? ? ?ADVANCED DIRECTIVES (Y/N):  N ? ?HEALTH MAINTENANCE: ?Social History  ? ?Tobacco Use  ? Smoking status: Some Days  ?  Packs/day: 0.50  ?  Types: Cigarettes  ? Smokeless tobacco: Never  ?Vaping Use  ? Vaping Use: Never used  ?Substance Use Topics  ? Alcohol use: No  ? Drug use: No  ? ? ? Colonoscopy: ? PAP: ? Bone density: ? Lipid panel: ? ?Allergies  ?Allergen Reactions  ? Shrimp [Shellfish Allergy] Nausea And Vomiting  ? ? ?Current Outpatient Medications  ?Medication Sig Dispense Refill  ? albuterol (PROVENTIL) (2.5 MG/3ML) 0.083% nebulizer solution Take 2.5 mg by nebulization every 6 (six) hours as needed  for wheezing or shortness of breath.    ? albuterol (VENTOLIN HFA) 108 (90 Base) MCG/ACT inhaler Inhale 2 puffs into the lungs every 6 (six) hours as needed for wheezing or shortness of breath. 1 Inhaler 0  ? amLODipine (NORVASC) 5 MG tablet Take 10 mg by mouth daily.    ? atorvastatin (LIPITOR) 20 MG tablet Take 20 mg by mouth daily.    ? baclofen (LIORESAL) 10 MG tablet TAKE 1 TABLET(10 MG) BY MOUTH THREE TIMES DAILY  90 tablet 1  ? fentaNYL (DURAGESIC) 25 MCG/HR Place 1 patch onto the skin every 3 (three) days. 10 patch 0  ? glipiZIDE (GLUCOTROL) 5 MG tablet Take 10 mg by mouth daily before breakfast.     ? HYDROcodone-acetaminophen (NORCO) 5-325 MG tablet Take 2 tablets by mouth every 6 (six) hours as needed for moderate pain. 90 tablet 0  ? lisinopril-hydrochlorothiazide (ZESTORETIC) 20-25 MG tablet Take 1 tablet by mouth daily. 30 tablet 0  ? metFORMIN (GLUCOPHAGE) 1000 MG tablet Take 1 tablet (1,000 mg total) by mouth 2 (two) times daily. 60 tablet 0  ? omeprazole (PRILOSEC) 20 MG capsule TAKE 1 CAPSULE(20 MG) BY MOUTH TWICE A DAY 60 capsule 2  ? ondansetron (ZOFRAN) 8 MG tablet Take 1 tablet (8 mg total) by mouth 2 (two) times daily as needed for refractory nausea / vomiting. 60 tablet 2  ? prochlorperazine (COMPAZINE) 10 MG tablet Take 1 tablet (10 mg total) by mouth every 6 (six) hours as needed (Nausea or vomiting). 60 tablet 2  ? TRELEGY ELLIPTA 200-62.5-25 MCG/INH AEPB Take 1 puff by mouth daily.    ? ALPRAZolam (XANAX) 0.5 MG tablet Take 1 tablet (0.5 mg total) by mouth as needed for anxiety (Before radiation treatments). (Patient not taking: Reported on 02/28/2021) 10 tablet 0  ? ibuprofen (ADVIL,MOTRIN) 200 MG tablet Take 400-800 mg every 8 (eight) hours as needed by mouth (for pain.). (Patient not taking: Reported on 09/18/2021)    ? lidocaine (LIDODERM) 5 % Place 1 patch onto the skin daily. Remove & Discard patch within 12 hours or as directed by MD (Patient not taking: Reported on 06/05/2021) 30 patch 0  ? ?No current facility-administered medications for this visit.  ? ? ?OBJECTIVE: ?Vitals:  ? 09/18/21 0911  ?BP: (!) 156/78  ?Pulse: (!) 104  ?Resp: 16  ?Temp: (!) 97.5 ?F (36.4 ?C)  ?SpO2: 99%  ? ?   Body mass index is 29.04 kg/m?[place] Kitchen    ECOG FS:1 - Symptomatic but completely ambulatory ? ?General: Well-developed, well-nourished, no acute distress. ?Eyes: Pink conjunctiva, anicteric sclera. ?HEENT: Normocephalic,  moist mucous membranes. ?Lungs: No audible wheezing or coughing. ?Heart: Regular rate and rhythm. ?Abdomen: Soft, nontender, no obvious distention. ?Musculoskeletal: No edema, cyanosis, or clubbing. ?Neuro: Alert, answering all questions appropriately. Cranial nerves grossly intact. ?Skin: No rashes or petechiae noted. ?Psych: Normal affect. ? ?LAB RESULTS: ? ?Lab Results  ?Component Value Date  ? NA 132 (L) 09/18/2021  ? K 4.0 09/18/2021  ? CL 93 (L) 09/18/2021  ? CO2 28 09/18/2021  ? GLUCOSE 150 (H) 09/18/2021  ? BUN 12 09/18/2021  ? CREATININE 0.71 09/18/2021  ? CALCIUM 9.5 09/18/2021  ? PROT 7.3 09/18/2021  ? ALBUMIN 4.1 09/18/2021  ? AST 20 09/18/2021  ? ALT 16 09/18/2021  ? ALKPHOS 90 09/18/2021  ? BILITOT 0.6 09/18/2021  ? GFRNONAA >60 09/18/2021  ? GFRAA >60 11/12/2019  ? ? ?Lab Results  ?Component Value Date  ? WBC 16.4 (H) 09/18/2021  ?  NEUTROABS 13.5 (H) 09/18/2021  ? HGB 15.8 09/18/2021  ? HCT 47.7 09/18/2021  ? MCV 87.0 09/18/2021  ? PLT 381 09/18/2021  ? ? ? ?STUDIES: ?No results found. ? ?ONCOLOGY HISTORY: Patient initially underwent treatment with weekly carboplatinum and Taxol along with daily XRT.  He subsequently completed 1 year of maintenance durvalumab on August 27, 2018. CT scan on September 08, 2019 revealed progressive lymphadenopathy in his right supraclavicular and right thoracic inlet.  PET scan completed on September 27, 2019 confirmed likely recurrence.  Biopsy on October 04, 2019 consistent with squamous cell carcinoma. He completed XRT only for his local recurrence on November 17, 2019.  Repeat CT scan on August 28, 2020 with continued progression of 3 pulmonary nodules in his left and right upper lobe as well as right lower lobe.  PET scan results completed on September 18, 2020 revealed a right lower lobe nodule has a minimally elevated SUV of 2.9, the right upper lobe nodule has an SUV of 7.7 and additional 11 mm nodule in the left apex as an SUV of 5.3.  Patient has now completed XRT to these lesions.   ? 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 ? ?PLAN:   ? ?1.  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

## 2021-09-18 ENCOUNTER — Other Ambulatory Visit: Payer: Self-pay | Admitting: Emergency Medicine

## 2021-09-18 ENCOUNTER — Inpatient Hospital Stay (HOSPITAL_BASED_OUTPATIENT_CLINIC_OR_DEPARTMENT_OTHER): Payer: 59 | Admitting: Oncology

## 2021-09-18 ENCOUNTER — Encounter: Payer: Self-pay | Admitting: Oncology

## 2021-09-18 ENCOUNTER — Ambulatory Visit: Payer: 59

## 2021-09-18 ENCOUNTER — Inpatient Hospital Stay: Payer: 59

## 2021-09-18 ENCOUNTER — Inpatient Hospital Stay: Payer: 59 | Attending: Oncology

## 2021-09-18 ENCOUNTER — Other Ambulatory Visit: Payer: Self-pay

## 2021-09-18 VITALS — BP 156/78 | HR 104 | Temp 97.5°F | Resp 16 | Ht 68.0 in | Wt 191.0 lb

## 2021-09-18 VITALS — HR 98

## 2021-09-18 DIAGNOSIS — C3492 Malignant neoplasm of unspecified part of left bronchus or lung: Secondary | ICD-10-CM

## 2021-09-18 DIAGNOSIS — D72829 Elevated white blood cell count, unspecified: Secondary | ICD-10-CM | POA: Insufficient documentation

## 2021-09-18 DIAGNOSIS — G893 Neoplasm related pain (acute) (chronic): Secondary | ICD-10-CM | POA: Insufficient documentation

## 2021-09-18 DIAGNOSIS — J449 Chronic obstructive pulmonary disease, unspecified: Secondary | ICD-10-CM | POA: Diagnosis not present

## 2021-09-18 DIAGNOSIS — Z7984 Long term (current) use of oral hypoglycemic drugs: Secondary | ICD-10-CM | POA: Insufficient documentation

## 2021-09-18 DIAGNOSIS — E1165 Type 2 diabetes mellitus with hyperglycemia: Secondary | ICD-10-CM | POA: Insufficient documentation

## 2021-09-18 DIAGNOSIS — I10 Essential (primary) hypertension: Secondary | ICD-10-CM | POA: Diagnosis not present

## 2021-09-18 DIAGNOSIS — E871 Hypo-osmolality and hyponatremia: Secondary | ICD-10-CM | POA: Insufficient documentation

## 2021-09-18 DIAGNOSIS — N400 Enlarged prostate without lower urinary tract symptoms: Secondary | ICD-10-CM | POA: Diagnosis not present

## 2021-09-18 DIAGNOSIS — I700 Atherosclerosis of aorta: Secondary | ICD-10-CM | POA: Insufficient documentation

## 2021-09-18 DIAGNOSIS — K5900 Constipation, unspecified: Secondary | ICD-10-CM | POA: Diagnosis not present

## 2021-09-18 DIAGNOSIS — Z9221 Personal history of antineoplastic chemotherapy: Secondary | ICD-10-CM | POA: Diagnosis not present

## 2021-09-18 DIAGNOSIS — Z79899 Other long term (current) drug therapy: Secondary | ICD-10-CM | POA: Diagnosis not present

## 2021-09-18 DIAGNOSIS — R21 Rash and other nonspecific skin eruption: Secondary | ICD-10-CM | POA: Insufficient documentation

## 2021-09-18 DIAGNOSIS — N200 Calculus of kidney: Secondary | ICD-10-CM | POA: Insufficient documentation

## 2021-09-18 DIAGNOSIS — Z5111 Encounter for antineoplastic chemotherapy: Secondary | ICD-10-CM | POA: Diagnosis present

## 2021-09-18 DIAGNOSIS — F17210 Nicotine dependence, cigarettes, uncomplicated: Secondary | ICD-10-CM | POA: Diagnosis not present

## 2021-09-18 DIAGNOSIS — Z923 Personal history of irradiation: Secondary | ICD-10-CM | POA: Insufficient documentation

## 2021-09-18 DIAGNOSIS — E1142 Type 2 diabetes mellitus with diabetic polyneuropathy: Secondary | ICD-10-CM | POA: Diagnosis not present

## 2021-09-18 LAB — CBC WITH DIFFERENTIAL/PLATELET
Abs Immature Granulocytes: 0.22 10*3/uL — ABNORMAL HIGH (ref 0.00–0.07)
Basophils Absolute: 0.3 10*3/uL — ABNORMAL HIGH (ref 0.0–0.1)
Basophils Relative: 2 %
Eosinophils Absolute: 0.1 10*3/uL (ref 0.0–0.5)
Eosinophils Relative: 0 %
HCT: 47.7 % (ref 39.0–52.0)
Hemoglobin: 15.8 g/dL (ref 13.0–17.0)
Immature Granulocytes: 1 %
Lymphocytes Relative: 8 %
Lymphs Abs: 1.3 10*3/uL (ref 0.7–4.0)
MCH: 28.8 pg (ref 26.0–34.0)
MCHC: 33.1 g/dL (ref 30.0–36.0)
MCV: 87 fL (ref 80.0–100.0)
Monocytes Absolute: 1.1 10*3/uL — ABNORMAL HIGH (ref 0.1–1.0)
Monocytes Relative: 7 %
Neutro Abs: 13.5 10*3/uL — ABNORMAL HIGH (ref 1.7–7.7)
Neutrophils Relative %: 82 %
Platelets: 381 10*3/uL (ref 150–400)
RBC: 5.48 MIL/uL (ref 4.22–5.81)
RDW: 13.8 % (ref 11.5–15.5)
WBC: 16.4 10*3/uL — ABNORMAL HIGH (ref 4.0–10.5)
nRBC: 0 % (ref 0.0–0.2)

## 2021-09-18 LAB — URINALYSIS, DIPSTICK ONLY
Bilirubin Urine: NEGATIVE
Glucose, UA: NEGATIVE mg/dL
Ketones, ur: NEGATIVE mg/dL
Nitrite: POSITIVE — AB
Protein, ur: 100 mg/dL — AB
Specific Gravity, Urine: 1.008 (ref 1.005–1.030)
pH: 6 (ref 5.0–8.0)

## 2021-09-18 LAB — COMPREHENSIVE METABOLIC PANEL
ALT: 16 U/L (ref 0–44)
AST: 20 U/L (ref 15–41)
Albumin: 4.1 g/dL (ref 3.5–5.0)
Alkaline Phosphatase: 90 U/L (ref 38–126)
Anion gap: 11 (ref 5–15)
BUN: 12 mg/dL (ref 8–23)
CO2: 28 mmol/L (ref 22–32)
Calcium: 9.5 mg/dL (ref 8.9–10.3)
Chloride: 93 mmol/L — ABNORMAL LOW (ref 98–111)
Creatinine, Ser: 0.71 mg/dL (ref 0.61–1.24)
GFR, Estimated: >60 mL/min (ref >60)
Glucose, Bld: 150 mg/dL — ABNORMAL HIGH (ref 70–99)
Potassium: 4 mmol/L (ref 3.5–5.1)
Sodium: 132 mmol/L — ABNORMAL LOW (ref 135–145)
Total Bilirubin: 0.6 mg/dL (ref 0.3–1.2)
Total Protein: 7.3 g/dL (ref 6.5–8.1)

## 2021-09-18 LAB — TSH: TSH: 5.348 u[IU]/mL — ABNORMAL HIGH (ref 0.350–4.500)

## 2021-09-18 MED ORDER — SODIUM CHLORIDE 0.9 % IV SOLN
67.0000 mg/m2 | Freq: Once | INTRAVENOUS | Status: AC
  Administered 2021-09-18: 140 mg via INTRAVENOUS
  Filled 2021-09-18: qty 14

## 2021-09-18 MED ORDER — SODIUM CHLORIDE 0.9 % IV SOLN
10.0000 mg | Freq: Once | INTRAVENOUS | Status: AC
  Administered 2021-09-18: 10 mg via INTRAVENOUS
  Filled 2021-09-18: qty 10

## 2021-09-18 MED ORDER — HYDROCODONE-ACETAMINOPHEN 5-325 MG PO TABS: 2.0000 | ORAL_TABLET | Freq: Four times a day (QID) | ORAL | 0 refills | Status: DC | PRN

## 2021-09-18 MED ORDER — SODIUM CHLORIDE 0.9 % IV SOLN
Freq: Once | INTRAVENOUS | Status: AC
  Filled 2021-09-18: qty 250

## 2021-09-18 MED ORDER — SODIUM CHLORIDE 0.9 % IV SOLN
10.0000 mg/kg | Freq: Once | INTRAVENOUS | Status: AC
  Administered 2021-09-18: 900 mg via INTRAVENOUS
  Filled 2021-09-18: qty 50

## 2021-09-18 MED ORDER — DIPHENHYDRAMINE HCL 50 MG/ML IJ SOLN
50.0000 mg | Freq: Once | INTRAMUSCULAR | Status: AC
  Administered 2021-09-18: 50 mg via INTRAVENOUS
  Filled 2021-09-18: qty 1

## 2021-09-18 MED ORDER — FAMOTIDINE IN NACL 20-0.9 MG/50ML-% IV SOLN
20.0000 mg | Freq: Once | INTRAVENOUS | Status: AC
  Administered 2021-09-18: 20 mg via INTRAVENOUS
  Filled 2021-09-18: qty 50

## 2021-09-18 MED ORDER — ACETAMINOPHEN 325 MG PO TABS
650.0000 mg | ORAL_TABLET | Freq: Once | ORAL | Status: AC
  Administered 2021-09-18: 650 mg via ORAL
  Filled 2021-09-18: qty 2

## 2021-09-18 NOTE — Progress Notes (Signed)
1020: Pt unable to give urine sample at this time. Last urine protein on 08/28/21 was greater than 300. Per Dr. Grayland Ormond okay to proceed with Cyramza as scheduled, pt to give urine sample prior to discharge.  ? ?

## 2021-09-18 NOTE — Patient Instructions (Signed)
[facility] CANCER CTR AT [place]  Discharge Instructions: ?Thank you for choosing [place] to provide your oncology and hematology care.  ?If you have a lab appointment with the [facility], please go directly to the [facility] and check in at the registration area. ? ?Wear comfortable clothing and clothing appropriate for easy access to any Portacath or PICC line.  ? ?We strive to give you quality time with your provider. You may need to reschedule your appointment if you arrive late (15 or more minutes).  Arriving late affects you and other patients whose appointments are after yours.  Also, if you miss three or more appointments without notifying the office, you may be dismissed from the clinic at the provider?s discretion.    ?  ?For prescription refill requests, have your pharmacy contact our office and allow 72 hours for refills to be completed.   ? ?Today you received the following chemotherapy and/or immunotherapy agents Cyramza and Taxotere    ?  ?To help prevent nausea and vomiting after your treatment, we encourage you to take your nausea medication as directed. ? ?BELOW ARE SYMPTOMS THAT SHOULD BE REPORTED IMMEDIATELY: ?*FEVER GREATER THAN 100.4 F (38 ?C) OR HIGHER ?*CHILLS OR SWEATING ?*NAUSEA AND VOMITING THAT IS NOT CONTROLLED WITH YOUR NAUSEA MEDICATION ?*UNUSUAL SHORTNESS OF BREATH ?*UNUSUAL BRUISING OR BLEEDING ?*URINARY PROBLEMS (pain or burning when urinating, or frequent urination) ?*BOWEL PROBLEMS (unusual diarrhea, constipation, pain near the anus) ?TENDERNESS IN MOUTH AND THROAT WITH OR WITHOUT PRESENCE OF ULCERS (sore throat, sores in mouth, or a toothache) ?UNUSUAL RASH, SWELLING OR PAIN  ?UNUSUAL VAGINAL DISCHARGE OR ITCHING  ? ?Items with * indicate a potential emergency and should be followed up as soon as possible or go to the Emergency Department if any problems should occur. ? ?Please show the CHEMOTHERAPY ALERT CARD or IMMUNOTHERAPY ALERT CARD at  check-in to the Emergency Department and triage nurse. ? ?Should you have questions after your visit or need to cancel or reschedule your appointment, please contact [facility] CANCER [place] AT [place]  [phone] and follow the prompts.  Office hours are 8:00 a.m. to 4:30 p.m. Monday - Friday. Please note that voicemails left after 4:00 p.m. may not be returned until the following business day.  We are closed weekends and major holidays. You have access to a nurse at all times for urgent questions. Please call the main number to the clinic [phone] and follow the prompts. ? ?For any non-urgent questions, you may also contact your provider using MyChart. We now offer e-Visits for anyone 59 and older to request care online for non-urgent symptoms. For details visit [email or web address]. ?  ?Also download the MyChart app! Go to the app store, search "MyChart", open the app, select [place], and log in with your MyChart username and password. ? ?Due to Covid, a mask is required upon entering the hospital/clinic. If you do not have a mask, one will be given to you upon arrival. For doctor visits, patients may have 1 support person aged 64 or older with them. For treatment visits, patients cannot have anyone with them due to current Covid guidelines and our immunocompromised population.  ?

## 2021-09-18 NOTE — Progress Notes (Signed)
Per MD ok to treat with Uprotein 300. ?

## 2021-09-19 LAB — T4: T4, Total: 9.8 ug/dL (ref 4.5–12.0)

## 2021-10-03 ENCOUNTER — Ambulatory Visit: Admission: RE | Admit: 2021-10-03 | Payer: 59 | Source: Ambulatory Visit | Admitting: Radiation Oncology

## 2021-10-05 NOTE — Progress Notes (Signed)
?[facility]  ?[phone]) [phone] [phone] ? ?ID: [name] OB: Mar 28, 1961  MR#: [id number]  OIB#:[id number] ? ?Patient Care Team: ?Lynnell Jude, MD as PCP - General (Family Medicine) ?Lloyd Huger, MD as Consulting Physician (Oncology) ?[name], MD as Referring Physician (Radiation Oncology) ?Telford Nab, RN as Equities trader ? ? 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 ? ?INTERVAL HISTORY: Patient returns to clinic today for further evaluation, discussion of his PET scan results, and consideration of cycle 5 of Taxotere and Cyramza.  He continues to have right shoulder and neck pain as well as a peripheral neuropathy that is unchanged despite discontinuing fentanyl patch.  He feels more tired today, but equates this to the wedding he attended this weekend.  He also complains of occasional constipation.  He has a rash that is pruritic on his bilateral forearms.  He has no other neurologic complaints.  He has a good appetite and denies weight loss. He denies any chest pain, hemoptysis, or shortness of breath.  He continues to have chronic cough.  He denies any nausea, vomiting, constipation, or diarrhea.  He has no urinary complaints.  Patient offers no further specific complaints today. ? ?REVIEW OF SYSTEMS:   ?Review of Systems  ?Constitutional:  Positive for malaise/fatigue. Negative for fever and weight loss.  ?HENT: Negative.  Negative for congestion.   ?Respiratory: Negative.  Negative for cough, hemoptysis and shortness of breath.   ?Cardiovascular: Negative.  Negative for chest pain and leg swelling.  ?Gastrointestinal:  Positive for constipation. Negative for abdominal pain, heartburn, nausea and vomiting.  ?Genitourinary: Negative.  Negative for dysuria.  ?Musculoskeletal:  Positive for joint pain. Negative for back pain.  ?Skin:  Positive for itching and rash.   ?Neurological:  Positive for sensory change. Negative for dizziness, focal weakness, weakness and headaches.  ?Psychiatric/Behavioral: Negative.  The patient is not nervous/anxious.   ? ?As per HPI. Otherwise, a complete review of systems is negative. ? ?PAST MEDICAL HISTORY: ?Past Medical History:  ?Diagnosis Date  ? Asthma   ? Cancer associated pain   ? COPD (chronic obstructive pulmonary disease) ([place])   ? Diabetes mellitus without complication ([place])   ? Hypertension   ? Lung cancer ([place]) 05/2017  ? Hx Chemo + rad tx's.  ? Pneumonia   ? ? ?PAST SURGICAL HISTORY: ?Past Surgical History:  ?Procedure Laterality Date  ? ENDOBRONCHIAL ULTRASOUND N/A 05/12/2017  ? Procedure: ENDOBRONCHIAL ULTRASOUND;  Surgeon: Laverle Hobby, MD;  Location: [facility] ORS;  Service: Pulmonary;  Laterality: N/A;  ? FRACTURE SURGERY Left   ? ANKLE X 2  ? TONSILLECTOMY    ? ? ?FAMILY HISTORY: ?Family History  ?Problem Relation Age of Onset  ? Stroke Mother   ? Diabetes Mother   ? Hypertension Mother   ? Diabetes Father   ? Hypertension Father   ? Diabetes Sister   ? Diabetes Brother   ? Heart attack Paternal Uncle   ? Stroke Maternal Grandmother   ? ? ?ADVANCED DIRECTIVES (Y/N):  N ? ?HEALTH MAINTENANCE: ?Social History  ? ?Tobacco Use  ? Smoking status: Some Days  ?  Packs/day: 0.50  ?  Types: Cigarettes  ? Smokeless tobacco: Never  ?Vaping Use  ? Vaping Use: Never used  ?Substance Use Topics  ? Alcohol use: No  ? Drug use: No  ? ? ? Colonoscopy: ? PAP: ? Bone density: ? Lipid panel: ? ?Allergies  ?  Allergen Reactions  ? Shrimp [Shellfish Allergy] Nausea And Vomiting  ? ? ?Current Outpatient Medications  ?Medication Sig Dispense Refill  ? albuterol (PROVENTIL) (2.5 MG/3ML) 0.083% nebulizer solution Take 2.5 mg by nebulization every 6 (six) hours as needed for wheezing or shortness of breath.    ? albuterol (VENTOLIN HFA) 108 (90 Base) MCG/ACT inhaler Inhale 2 puffs into the lungs every 6 (six) hours as needed for wheezing or shortness  of breath. 1 Inhaler 0  ? amLODipine (NORVASC) 5 MG tablet Take 10 mg by mouth daily.    ? atorvastatin (LIPITOR) 20 MG tablet Take 20 mg by mouth daily.    ? baclofen (LIORESAL) 10 MG tablet TAKE 1 TABLET(10 MG) BY MOUTH THREE TIMES DAILY 90 tablet 1  ? fentaNYL (DURAGESIC) 25 MCG/HR Place 1 patch onto the skin every 3 (three) days. 10 patch 0  ? glipiZIDE (GLUCOTROL) 5 MG tablet Take 10 mg by mouth daily before breakfast.     ? HYDROcodone-acetaminophen (NORCO) 5-325 MG tablet Take 2 tablets by mouth every 6 (six) hours as needed for moderate pain. 90 tablet 0  ? lisinopril-hydrochlorothiazide (ZESTORETIC) 20-25 MG tablet Take 1 tablet by mouth daily. 30 tablet 0  ? metFORMIN (GLUCOPHAGE) 1000 MG tablet Take 1 tablet (1,000 mg total) by mouth 2 (two) times daily. 60 tablet 0  ? omeprazole (PRILOSEC) 20 MG capsule TAKE 1 CAPSULE(20 MG) BY MOUTH TWICE A DAY 60 capsule 2  ? ondansetron (ZOFRAN) 8 MG tablet Take 1 tablet (8 mg total) by mouth 2 (two) times daily as needed for refractory nausea / vomiting. 60 tablet 2  ? prochlorperazine (COMPAZINE) 10 MG tablet Take 1 tablet (10 mg total) by mouth every 6 (six) hours as needed (Nausea or vomiting). 60 tablet 2  ? TRELEGY ELLIPTA 200-62.5-25 MCG/INH AEPB Take 1 puff by mouth daily.    ? ALPRAZolam (XANAX) 0.5 MG tablet Take 1 tablet (0.5 mg total) by mouth as needed for anxiety (Before radiation treatments). (Patient not taking: Reported on 02/28/2021) 10 tablet 0  ? ibuprofen (ADVIL,MOTRIN) 200 MG tablet Take 400-800 mg every 8 (eight) hours as needed by mouth (for pain.). (Patient not taking: Reported on 09/18/2021)    ? lidocaine (LIDODERM) 5 % Place 1 patch onto the skin daily. Remove & Discard patch within 12 hours or as directed by MD (Patient not taking: Reported on 06/05/2021) 30 patch 0  ? ?No current facility-administered medications for this visit.  ? ?Facility-Administered Medications Ordered in Other Visits  ?Medication Dose Route Frequency Provider Last Rate  Last Admin  ? DOCEtaxel (TAXOTERE) 140 mg in sodium chloride 0.9 % 250 mL chemo infusion  67 mg/m2 (Treatment Plan Recorded) Intravenous Once Lloyd Huger, MD      ? ramucirumab [facility]) 900 mg in sodium chloride 0.9 % 160 mL chemo infusion  10 mg/kg (Treatment Plan Recorded) Intravenous Once Lloyd Huger, MD      ? ? ?OBJECTIVE: ?Vitals:  ? 10/09/21 0906  ?BP: (!) 150/90  ?Pulse: (!) 105  ?Resp: 20  ?Temp: (!) 97.5 ?F (36.4 ?C)  ?SpO2: 99%  ? ?   Body mass index is 29.04 kg/m?Marland Kitchen    ECOG FS:1 - Symptomatic but completely ambulatory ? ?General: Well-developed, well-nourished, no acute distress. ?Eyes: Pink conjunctiva, anicteric sclera. ?HEENT: Normocephalic, moist mucous membranes. ?Lungs: No audible wheezing or coughing. ?Heart: Regular rate and rhythm. ?Abdomen: Soft, nontender, no obvious distention. ?Musculoskeletal: No edema, cyanosis, or clubbing. ?Neuro: Alert, answering all questions appropriately. Cranial  nerves grossly intact. ?Skin: Erythema noted on bilateral forearms. ?Psych: Normal affect. ? ?LAB RESULTS: ? ?Lab Results  ?Component Value Date  ? NA 129 (L) 10/09/2021  ? K 4.0 10/09/2021  ? CL 90 (L) 10/09/2021  ? CO2 27 10/09/2021  ? GLUCOSE 177 (H) 10/09/2021  ? BUN 12 10/09/2021  ? CREATININE 0.71 10/09/2021  ? CALCIUM 9.4 10/09/2021  ? PROT 7.1 10/09/2021  ? ALBUMIN 3.9 10/09/2021  ? AST 25 10/09/2021  ? ALT 16 10/09/2021  ? ALKPHOS 84 10/09/2021  ? BILITOT 0.4 10/09/2021  ? GFRNONAA >60 10/09/2021  ? GFRAA >60 11/12/2019  ? ? ?Lab Results  ?Component Value Date  ? WBC 14.0 (H) 10/09/2021  ? NEUTROABS 12.0 (H) 10/09/2021  ? HGB 15.0 10/09/2021  ? HCT 44.6 10/09/2021  ? MCV 86.9 10/09/2021  ? PLT 336 10/09/2021  ? ? ? ?STUDIES: ?NM PET Image Restag (PS) Skull Base To Thigh ? ?Result Date: 10/08/2021 ?CLINICAL DATA:  Subsequent treatment strategy for squamous cell lung cancer. EXAM: NUCLEAR MEDICINE PET SKULL BASE TO THIGH TECHNIQUE: 9.3 mCi F-18 FDG was injected intravenously. Full-ring  PET imaging was performed from the skull base to thigh after the radiotracer. CT data was obtained and used for attenuation correction and anatomic localization. Fasting blood glucose: 144 mg/dl COMPARISON:  01/19

## 2021-10-08 ENCOUNTER — Encounter (HOSPITAL_COMMUNITY)
Admission: RE | Admit: 2021-10-08 | Discharge: 2021-10-08 | Disposition: A | Discharge status: Home / Self Care | Payer: 59 | Source: Ambulatory Visit | Attending: Oncology | Admitting: Oncology

## 2021-10-08 DIAGNOSIS — C3492 Malignant neoplasm of unspecified part of left bronchus or lung: Secondary | ICD-10-CM | POA: Diagnosis not present

## 2021-10-08 LAB — GLUCOSE, CAPILLARY: Glucose-Capillary: 144 mg/dL — ABNORMAL HIGH (ref 70–99)

## 2021-10-08 MED ORDER — FLUDEOXYGLUCOSE F - 18 (FDG) INJECTION
9.2900 | Freq: Once | INTRAVENOUS | Status: AC
  Administered 2021-10-08: 9.29 via INTRAVENOUS

## 2021-10-09 ENCOUNTER — Other Ambulatory Visit: Payer: Self-pay | Admitting: Emergency Medicine

## 2021-10-09 ENCOUNTER — Inpatient Hospital Stay (HOSPITAL_BASED_OUTPATIENT_CLINIC_OR_DEPARTMENT_OTHER): Payer: 59 | Admitting: Oncology

## 2021-10-09 ENCOUNTER — Ambulatory Visit
Admission: RE | Admit: 2021-10-09 | Discharge: 2021-10-09 | Disposition: A | Discharge status: Home / Self Care | Payer: 59 | Source: Ambulatory Visit | Attending: Radiation Oncology | Admitting: Radiation Oncology

## 2021-10-09 ENCOUNTER — Inpatient Hospital Stay: Payer: 59

## 2021-10-09 VITALS — BP 154/94 | HR 78

## 2021-10-09 VITALS — BP 150/90 | HR 105 | Temp 97.5°F | Resp 20 | Ht 68.0 in | Wt 191.0 lb

## 2021-10-09 DIAGNOSIS — Z5111 Encounter for antineoplastic chemotherapy: Secondary | ICD-10-CM | POA: Diagnosis not present

## 2021-10-09 DIAGNOSIS — C3492 Malignant neoplasm of unspecified part of left bronchus or lung: Secondary | ICD-10-CM

## 2021-10-09 DIAGNOSIS — C3412 Malignant neoplasm of upper lobe, left bronchus or lung: Secondary | ICD-10-CM

## 2021-10-09 LAB — CBC WITH DIFFERENTIAL/PLATELET
Abs Immature Granulocytes: 0.11 10*3/uL — ABNORMAL HIGH (ref 0.00–0.07)
Basophils Absolute: 0.2 10*3/uL — ABNORMAL HIGH (ref 0.0–0.1)
Basophils Relative: 1 %
Eosinophils Absolute: 0 10*3/uL (ref 0.0–0.5)
Eosinophils Relative: 0 %
HCT: 44.6 % (ref 39.0–52.0)
Hemoglobin: 15 g/dL (ref 13.0–17.0)
Immature Granulocytes: 1 %
Lymphocytes Relative: 6 %
Lymphs Abs: 0.8 10*3/uL (ref 0.7–4.0)
MCH: 29.2 pg (ref 26.0–34.0)
MCHC: 33.6 g/dL (ref 30.0–36.0)
MCV: 86.9 fL (ref 80.0–100.0)
Monocytes Absolute: 0.8 10*3/uL (ref 0.1–1.0)
Monocytes Relative: 6 %
Neutro Abs: 12 10*3/uL — ABNORMAL HIGH (ref 1.7–7.7)
Neutrophils Relative %: 86 %
Platelets: 336 10*3/uL (ref 150–400)
RBC: 5.13 MIL/uL (ref 4.22–5.81)
RDW: 14.2 % (ref 11.5–15.5)
WBC: 14 10*3/uL — ABNORMAL HIGH (ref 4.0–10.5)
nRBC: 0 % (ref 0.0–0.2)

## 2021-10-09 LAB — URINALYSIS, DIPSTICK ONLY
Bilirubin Urine: NEGATIVE
Glucose, UA: NEGATIVE mg/dL
Hgb urine dipstick: NEGATIVE
Ketones, ur: NEGATIVE mg/dL
Nitrite: NEGATIVE
Protein, ur: 100 mg/dL — AB
Specific Gravity, Urine: 1.015 (ref 1.005–1.030)
pH: 7 (ref 5.0–8.0)

## 2021-10-09 LAB — COMPREHENSIVE METABOLIC PANEL
ALT: 16 U/L (ref 0–44)
AST: 25 U/L (ref 15–41)
Albumin: 3.9 g/dL (ref 3.5–5.0)
Alkaline Phosphatase: 84 U/L (ref 38–126)
Anion gap: 12 (ref 5–15)
BUN: 12 mg/dL (ref 8–23)
CO2: 27 mmol/L (ref 22–32)
Calcium: 9.4 mg/dL (ref 8.9–10.3)
Chloride: 90 mmol/L — ABNORMAL LOW (ref 98–111)
Creatinine, Ser: 0.71 mg/dL (ref 0.61–1.24)
GFR, Estimated: >60 mL/min (ref >60)
Glucose, Bld: 177 mg/dL — ABNORMAL HIGH (ref 70–99)
Potassium: 4 mmol/L (ref 3.5–5.1)
Sodium: 129 mmol/L — ABNORMAL LOW (ref 135–145)
Total Bilirubin: 0.4 mg/dL (ref 0.3–1.2)
Total Protein: 7.1 g/dL (ref 6.5–8.1)

## 2021-10-09 LAB — TSH: TSH: 5.821 u[IU]/mL — ABNORMAL HIGH (ref 0.350–4.500)

## 2021-10-09 MED ORDER — DIPHENHYDRAMINE HCL 50 MG/ML IJ SOLN
50.0000 mg | Freq: Once | INTRAMUSCULAR | Status: AC
  Administered 2021-10-09: 50 mg via INTRAVENOUS
  Filled 2021-10-09: qty 1

## 2021-10-09 MED ORDER — FAMOTIDINE IN NACL 20-0.9 MG/50ML-% IV SOLN
20.0000 mg | Freq: Once | INTRAVENOUS | Status: AC
  Administered 2021-10-09: 20 mg via INTRAVENOUS
  Filled 2021-10-09: qty 50

## 2021-10-09 MED ORDER — SODIUM CHLORIDE 0.9 % IV SOLN
10.0000 mg/kg | Freq: Once | INTRAVENOUS | Status: AC
  Administered 2021-10-09: 900 mg via INTRAVENOUS
  Filled 2021-10-09: qty 50

## 2021-10-09 MED ORDER — ACETAMINOPHEN 325 MG PO TABS
650.0000 mg | ORAL_TABLET | Freq: Once | ORAL | Status: AC
  Administered 2021-10-09: 650 mg via ORAL
  Filled 2021-10-09: qty 2

## 2021-10-09 MED ORDER — SODIUM CHLORIDE 0.9 % IV SOLN
10.0000 mg | Freq: Once | INTRAVENOUS | Status: AC
  Administered 2021-10-09: 10 mg via INTRAVENOUS
  Filled 2021-10-09: qty 10

## 2021-10-09 MED ORDER — SODIUM CHLORIDE 0.9 % IV SOLN
Freq: Once | INTRAVENOUS | Status: AC
  Filled 2021-10-09: qty 250

## 2021-10-09 MED ORDER — SODIUM CHLORIDE 0.9 % IV SOLN
67.0000 mg/m2 | Freq: Once | INTRAVENOUS | Status: AC
  Administered 2021-10-09: 140 mg via INTRAVENOUS
  Filled 2021-10-09: qty 14

## 2021-10-09 MED ORDER — HYDROCODONE-ACETAMINOPHEN 5-325 MG PO TABS: 2.0000 | ORAL_TABLET | Freq: Four times a day (QID) | ORAL | 0 refills | Status: DC | PRN

## 2021-10-09 NOTE — Progress Notes (Signed)
Patient tolerated DOCEtaxel /ramucirumab infusion well via 24 g angio R FA, no questions/concerns voiced. Patient stable at discharge. AVS given. Headed to radiation appointment  ?

## 2021-10-09 NOTE — Progress Notes (Signed)
Pt has urinary issues, unable to empty bladder completely only to go back to bathroom a few minutes after. Pt c/o increased shortness of breath with exertion and with lying flat. . Pt also c/o itchy rash to b/l posterior forearms. Pt reports constipation and wants to know how much miralax he needs to take. ?

## 2021-10-09 NOTE — Patient Instructions (Signed)
[facility] CANCER CTR AT [place]  Discharge Instructions: ?Thank you for choosing [facility] to provide your oncology and hematology care.  ?If you have a lab appointment with the [facility], please go directly to the [facility] and check in at the registration area. ? ?Wear comfortable clothing and clothing appropriate for easy access to any Portacath or PICC line.  ? ?We strive to give you quality time with your provider. You may need to reschedule your appointment if you arrive late (15 or more minutes).  Arriving late affects you and other patients whose appointments are after yours.  Also, if you miss three or more appointments without notifying the office, you may be dismissed from the clinic at the provider?s discretion.    ?  ?For prescription refill requests, have your pharmacy contact our office and allow 72 hours for refills to be completed.   ? ?Today you received the following chemotherapy and/or immunotherapy agents: DOCETAXEL/ Ramucirumab  ?  ?To help prevent nausea and vomiting after your treatment, we encourage you to take your nausea medication as directed. ? ?BELOW ARE SYMPTOMS THAT SHOULD BE REPORTED IMMEDIATELY: ?*FEVER GREATER THAN 100.4 F (38 ?C) OR HIGHER ?*CHILLS OR SWEATING ?*NAUSEA AND VOMITING THAT IS NOT CONTROLLED WITH YOUR NAUSEA MEDICATION ?*UNUSUAL SHORTNESS OF BREATH ?*UNUSUAL BRUISING OR BLEEDING ?*URINARY PROBLEMS (pain or burning when urinating, or frequent urination) ?*BOWEL PROBLEMS (unusual diarrhea, constipation, pain near the anus) ?TENDERNESS IN MOUTH AND THROAT WITH OR WITHOUT PRESENCE OF ULCERS (sore throat, sores in mouth, or a toothache) ?UNUSUAL RASH, SWELLING OR PAIN  ?UNUSUAL VAGINAL DISCHARGE OR ITCHING  ? ?Items with * indicate a potential emergency and should be followed up as soon as possible or go to the Emergency Department if any problems should occur. ? ?Please show the CHEMOTHERAPY ALERT CARD or IMMUNOTHERAPY ALERT CARD at  check-in to the Emergency Department and triage nurse. ? ?Should you have questions after your visit or need to cancel or reschedule your appointment, please contact [facility] CANCER [place] AT [place]  [phone] and follow the prompts.  Office hours are 8:00 a.m. to 4:30 p.m. Monday - Friday. Please note that voicemails left after 4:00 p.m. may not be returned until the following business day.  We are closed weekends and major holidays. You have access to a nurse at all times for urgent questions. Please call the main number to the clinic [phone] and follow the prompts. ? ?For any non-urgent questions, you may also contact your provider using MyChart. We now offer e-Visits for anyone 63 and older to request care online for non-urgent symptoms. For details visit [email or web address]. ?  ?Also download the MyChart app! Go to the app store, search "MyChart", open the app, select [facility], and log in with your MyChart username and password. ? ?Due to Covid, a mask is required upon entering the hospital/clinic. If you do not have a mask, one will be given to you upon arrival. For doctor visits, patients may have 1 support person aged 24 or older with them. For treatment visits, patients cannot have anyone with them due to current Covid guidelines and our immunocompromised population.  ?

## 2021-10-09 NOTE — Progress Notes (Signed)
Radiation Oncology ?Follow up Note ? ?Name: Gabriel Mendez   ?Date:   10/09/2021 ?MRN:  [id number] ?DOB: 1961-05-24  ? ? ?This 61 y.o. male presents to the clinic today for 35-month follow-up status post SBRT to his right lung and patient with known stage IV squamous cell carcinoma of the lung.  . ? ?REFERRING PROVIDER: Lynnell Jude, MD ? ?HPI: Patient is a 61 year old male admitted for multiple treatments for squamous cell carcinoma of the lung.  Originally treated for stage.  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  He is also received treatment to his right supra Tressa Busman as well as SBRT to his right lung.  Clinically he is currently doing well.  We have been following a lesion in his left upper lobe cavitary with peripheral enhancing PET positive lesion which has increased in size and hypermetabolic activity since his last PET CT scan.  He is currently onTaxotere and Cyramza under medical oncology's direction which she is tolerating well.  He specifically has cough hemoptysis or chest tightness.  Patient on recent PET scan has no other evidence of disease except progressive left upper lobe cavitary lesion ? ?COMPLICATIONS OF TREATMENT: none ? ?FOLLOW UP COMPLIANCE: keeps appointments  ? ?PHYSICAL EXAM:  ?There were no vitals taken for this visit. ?Well-developed well-nourished patient in NAD. HEENT reveals PERLA, EOMI, discs not visualized.  Oral cavity is clear. No oral mucosal lesions are identified. Neck is clear without evidence of cervical or supraclavicular adenopathy. Lungs are clear to A&P. Cardiac examination is essentially unremarkable with regular rate and rhythm without murmur rub or thrill. Abdomen is benign with no organomegaly or masses noted. Motor sensory and DTR levels are equal and symmetric in the upper and lower extremities. Cranial nerves II through XII are grossly intact. Proprioception is intact. No peripheral adenopathy or edema is identified. No motor or sensory levels are noted. Crude  visual fields are within normal range. ? ?RADIOLOGY RESULTS: Serial PET CT scans reviewed compatible with above-stated findings ? ?PLAN: At this time I like to go ahead with SBRT to the left upper lobe lesion since this is the only area of hypermetabolic activity in this patient.  It is progressing and getting somewhat more hypermetabolic.  We will plan on delivering 60 Gray in 5 fractions.  Risks and benefits of treatment including extremely low side effect profile were discussed with the patient.  I personally set up and ordered CT simulation for next week.  Patient Comprehends my treatment plan well. ? ?I would like to take this opportunity to thank you for allowing me to participate in the care of your patient.. ?  ? [name], MD ? ?

## 2021-10-10 LAB — T4: T4, Total: 9.9 ug/dL (ref 4.5–12.0)

## 2021-10-12 ENCOUNTER — Inpatient Hospital Stay: Payer: 59 | Admitting: Internal Medicine

## 2021-10-18 ENCOUNTER — Ambulatory Visit
Admission: RE | Admit: 2021-10-18 | Discharge: 2021-10-18 | Disposition: A | Discharge status: Home / Self Care | Payer: 59 | Source: Ambulatory Visit | Attending: Radiation Oncology | Admitting: Radiation Oncology

## 2021-10-18 DIAGNOSIS — I10 Essential (primary) hypertension: Secondary | ICD-10-CM | POA: Diagnosis not present

## 2021-10-18 DIAGNOSIS — E871 Hypo-osmolality and hyponatremia: Secondary | ICD-10-CM | POA: Diagnosis not present

## 2021-10-18 DIAGNOSIS — R21 Rash and other nonspecific skin eruption: Secondary | ICD-10-CM | POA: Diagnosis not present

## 2021-10-18 DIAGNOSIS — E1142 Type 2 diabetes mellitus with diabetic polyneuropathy: Secondary | ICD-10-CM | POA: Diagnosis not present

## 2021-10-18 DIAGNOSIS — F17210 Nicotine dependence, cigarettes, uncomplicated: Secondary | ICD-10-CM | POA: Diagnosis not present

## 2021-10-18 DIAGNOSIS — N400 Enlarged prostate without lower urinary tract symptoms: Secondary | ICD-10-CM | POA: Diagnosis not present

## 2021-10-18 DIAGNOSIS — Z79899 Other long term (current) drug therapy: Secondary | ICD-10-CM | POA: Diagnosis not present

## 2021-10-18 DIAGNOSIS — Z510 Encounter for antineoplastic radiation therapy: Secondary | ICD-10-CM | POA: Insufficient documentation

## 2021-10-18 DIAGNOSIS — C3412 Malignant neoplasm of upper lobe, left bronchus or lung: Secondary | ICD-10-CM | POA: Insufficient documentation

## 2021-10-18 DIAGNOSIS — E1165 Type 2 diabetes mellitus with hyperglycemia: Secondary | ICD-10-CM | POA: Diagnosis not present

## 2021-10-18 DIAGNOSIS — K5900 Constipation, unspecified: Secondary | ICD-10-CM | POA: Diagnosis not present

## 2021-10-23 DIAGNOSIS — Z510 Encounter for antineoplastic radiation therapy: Secondary | ICD-10-CM | POA: Diagnosis not present

## 2021-10-27 ENCOUNTER — Other Ambulatory Visit: Payer: Self-pay | Admitting: Oncology

## 2021-10-28 NOTE — Progress Notes (Signed)
?[facility]  ?Telephone:([phone]) [phone] [phone] ? ?ID: Gabriel Mendez OB: 31-May-1961  MR#: [id number]  LTR#:[id number] ? ?Patient Care Team: ?Lynnell Jude, MD as PCP - General (Family Medicine) ?Lloyd Huger, MD as Consulting Physician (Oncology) ?[name], MD as Referring Physician (Radiation Oncology) ?Telford Nab, RN as Equities trader ? ? 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 ? ?INTERVAL HISTORY: Patient returns to clinic today for further evaluation and consideration of cycle 6 of Taxotere and Cyramza.  He has increased weakness and fatigue, particularly after his treatments. He continues to have right shoulder and neck pain as well as a peripheral neuropathy that is unchanged despite discontinuing fentanyl patch.  He has no neurologic complaints.  He has a good appetite and denies weight loss. He denies any chest pain, hemoptysis, or shortness of breath.  He continues to have chronic cough.  He denies any nausea, vomiting, constipation, or diarrhea.  He has no urinary complaints.  Patient offers no further specific complaints today. ? ?REVIEW OF SYSTEMS:   ?Review of Systems  ?Constitutional:  Positive for malaise/fatigue. Negative for fever and weight loss.  ?HENT: Negative.  Negative for congestion.   ?Respiratory: Negative.  Negative for cough, hemoptysis and shortness of breath.   ?Cardiovascular: Negative.  Negative for chest pain and leg swelling.  ?Gastrointestinal:  Positive for constipation. Negative for abdominal pain, heartburn, nausea and vomiting.  ?Genitourinary: Negative.  Negative for dysuria.  ?Musculoskeletal:  Positive for joint pain. Negative for back pain.  ?Skin: Negative.  Negative for itching and rash.  ?Neurological:  Positive for sensory change. Negative for dizziness, focal weakness, weakness and headaches.  ?Psychiatric/Behavioral: Negative.  The patient is  not nervous/anxious.   ? ?As per HPI. Otherwise, a complete review of systems is negative. ? ?PAST MEDICAL HISTORY: ?Past Medical History:  ?Diagnosis Date  ? Asthma   ? Cancer associated pain   ? COPD (chronic obstructive pulmonary disease) ([place])   ? Diabetes mellitus without complication ([place])   ? Hypertension   ? Lung cancer ([place]) 05/2017  ? Hx Chemo + rad tx's.  ? Pneumonia   ? ? ?PAST SURGICAL HISTORY: ?Past Surgical History:  ?Procedure Laterality Date  ? ENDOBRONCHIAL ULTRASOUND N/A 05/12/2017  ? Procedure: ENDOBRONCHIAL ULTRASOUND;  Surgeon: Laverle Hobby, MD;  Location: [facility] ORS;  Service: Pulmonary;  Laterality: N/A;  ? FRACTURE SURGERY Left   ? ANKLE X 2  ? TONSILLECTOMY    ? ? ?FAMILY HISTORY: ?Family History  ?Problem Relation Age of Onset  ? Stroke Mother   ? Diabetes Mother   ? Hypertension Mother   ? Diabetes Father   ? Hypertension Father   ? Diabetes Sister   ? Diabetes Brother   ? Heart attack Paternal Uncle   ? Stroke Maternal Grandmother   ? ? ?ADVANCED DIRECTIVES (Y/N):  N ? ?HEALTH MAINTENANCE: ?Social History  ? ?Tobacco Use  ? Smoking status: Some Days  ?  Packs/day: 0.50  ?  Types: Cigarettes  ? Smokeless tobacco: Never  ?Vaping Use  ? Vaping Use: Never used  ?Substance Use Topics  ? Alcohol use: No  ? Drug use: No  ? ? ? Colonoscopy: ? PAP: ? Bone density: ? Lipid panel: ? ?Allergies  ?Allergen Reactions  ? Shrimp [Shellfish Allergy] Nausea And Vomiting  ? ? ?Current Outpatient Medications  ?Medication Sig Dispense Refill  ? albuterol (PROVENTIL) (2.5 MG/3ML) 0.083% nebulizer solution Take  2.5 mg by nebulization every 6 (six) hours as needed for wheezing or shortness of breath.    ? albuterol (VENTOLIN HFA) 108 (90 Base) MCG/ACT inhaler Inhale 2 puffs into the lungs every 6 (six) hours as needed for wheezing or shortness of breath. 1 Inhaler 0  ? amLODipine (NORVASC) 5 MG tablet Take 10 mg by mouth daily.    ? atorvastatin (LIPITOR) 20 MG tablet Take 20 mg by mouth daily.    ?  baclofen (LIORESAL) 10 MG tablet TAKE 1 TABLET(10 MG) BY MOUTH THREE TIMES DAILY 90 tablet 1  ? glipiZIDE (GLUCOTROL) 5 MG tablet Take 10 mg by mouth daily before breakfast.     ? ibuprofen (ADVIL,MOTRIN) 200 MG tablet Take 400-800 mg by mouth every 8 (eight) hours as needed (for pain.).    ? lisinopril-hydrochlorothiazide (ZESTORETIC) 20-25 MG tablet Take 1 tablet by mouth daily. 30 tablet 0  ? metFORMIN (GLUCOPHAGE) 1000 MG tablet Take 1 tablet (1,000 mg total) by mouth 2 (two) times daily. 60 tablet 0  ? omeprazole (PRILOSEC) 20 MG capsule TAKE 1 CAPSULE(20 MG) BY MOUTH TWICE DAILY 60 capsule 2  ? ondansetron (ZOFRAN) 8 MG tablet Take 1 tablet (8 mg total) by mouth 2 (two) times daily as needed for refractory nausea / vomiting. 60 tablet 2  ? prochlorperazine (COMPAZINE) 10 MG tablet Take 1 tablet (10 mg total) by mouth every 6 (six) hours as needed (Nausea or vomiting). 60 tablet 2  ? TRELEGY ELLIPTA 200-62.5-25 MCG/INH AEPB Take 1 puff by mouth daily.    ? ALPRAZolam (XANAX) 0.5 MG tablet Take 1 tablet (0.5 mg total) by mouth as needed for anxiety (Before radiation treatments). (Patient not taking: Reported on 02/28/2021) 10 tablet 0  ? fentaNYL (DURAGESIC) 25 MCG/HR Place 1 patch onto the skin every 3 (three) days. 10 patch 0  ? HYDROcodone-acetaminophen (NORCO) 5-325 MG tablet Take 2 tablets by mouth every 4 (four) hours as needed for moderate pain. 90 tablet 0  ? ?No current facility-administered medications for this visit.  ? ? ?OBJECTIVE: ?Vitals:  ? 10/30/21 0917  ?BP: (!) 183/95  ?Pulse: 97  ?Resp: 18  ?Temp: (!) 97 ?F (36.1 ?C)  ?SpO2: 99%  ? ?   Body mass index is 28.92 kg/m?Marland Kitchen    ECOG FS:1 - Symptomatic but completely ambulatory ? ?General: Well-developed, well-nourished, no acute distress. ?Eyes: Pink conjunctiva, anicteric sclera. ?HEENT: Normocephalic, moist mucous membranes. ?Lungs: No audible wheezing or coughing. ?Heart: Regular rate and rhythm. ?Abdomen: Soft, nontender, no obvious  distention. ?Musculoskeletal: No edema, cyanosis, or clubbing. ?Neuro: Alert, answering all questions appropriately. Cranial nerves grossly intact. ?Skin: No rashes or petechiae noted. ?Psych: Normal affect. ? ?LAB RESULTS: ? ?Lab Results  ?Component Value Date  ? NA 131 (L) 10/30/2021  ? K 3.6 10/30/2021  ? CL 91 (L) 10/30/2021  ? CO2 28 10/30/2021  ? GLUCOSE 143 (H) 10/30/2021  ? BUN 9 10/30/2021  ? CREATININE 0.51 (L) 10/30/2021  ? CALCIUM 9.3 10/30/2021  ? PROT 7.0 10/30/2021  ? ALBUMIN 3.9 10/30/2021  ? AST 18 10/30/2021  ? ALT 13 10/30/2021  ? ALKPHOS 87 10/30/2021  ? BILITOT 0.6 10/30/2021  ? GFRNONAA >60 10/30/2021  ? GFRAA >60 11/12/2019  ? ? ?Lab Results  ?Component Value Date  ? WBC 12.0 (H) 10/30/2021  ? NEUTROABS 9.8 (H) 10/30/2021  ? HGB 15.1 10/30/2021  ? HCT 45.3 10/30/2021  ? MCV 87.1 10/30/2021  ? PLT 330 10/30/2021  ? ? ? ?STUDIES: ?NM  PET Image Restag (PS) Skull Base To Thigh ? ?Result Date: 10/08/2021 ?CLINICAL DATA:  Subsequent treatment strategy for squamous cell lung cancer. EXAM: NUCLEAR MEDICINE PET SKULL BASE TO THIGH TECHNIQUE: 9.3 mCi F-18 FDG was injected intravenously. Full-ring PET imaging was performed from the skull base to thigh after the radiotracer. CT data was obtained and used for attenuation correction and anatomic localization. Fasting blood glucose: 144 mg/dl COMPARISON:  07/05/2021, 05/27/2017 and CT chest 06/01/2021. FINDINGS: Mediastinal blood pool activity: SUV max 2.2 Liver activity: SUV max NA NECK: Left parotid nodule measures 1.4 cm with an SUV max, 10.2, compared to 5.1 previously. CT size and appearance are unchanged from 05/27/2017. No hypermetabolic lymph nodes. Incidental CT findings: None. CHEST: Thick-walled cavitary apical left upper lobe mass measures 3.1 x 3.7 cm, similar minimally decreased in size, previously 2.5 x 4.4 cm. However, SUV max has increased, 11.2, compared 8.9. No hypermetabolic mediastinal, hilar or axillary lymph nodes. No new hypermetabolic  pulmonary nodules. Incidental CT findings: Atherosclerotic calcification of the aorta, aortic valve and coronary arteries. Heart size normal. No pericardial effusion. Thin walled cyst in the right lower lobe (7/30), wit

## 2021-10-29 ENCOUNTER — Encounter: Payer: Self-pay | Admitting: Oncology

## 2021-10-29 ENCOUNTER — Ambulatory Visit: Payer: 59

## 2021-10-29 MED FILL — Dexamethasone Sodium Phosphate Inj 100 MG/10ML: INTRAMUSCULAR | Qty: 1 | Status: AC

## 2021-10-30 ENCOUNTER — Inpatient Hospital Stay: Payer: 59

## 2021-10-30 ENCOUNTER — Inpatient Hospital Stay: Payer: 59 | Attending: Oncology

## 2021-10-30 ENCOUNTER — Other Ambulatory Visit: Payer: Self-pay | Admitting: *Deleted

## 2021-10-30 ENCOUNTER — Inpatient Hospital Stay (HOSPITAL_BASED_OUTPATIENT_CLINIC_OR_DEPARTMENT_OTHER): Payer: 59 | Admitting: Oncology

## 2021-10-30 VITALS — BP 183/95 | HR 97 | Temp 97.0°F | Resp 18 | Wt 190.2 lb

## 2021-10-30 DIAGNOSIS — C3412 Malignant neoplasm of upper lobe, left bronchus or lung: Secondary | ICD-10-CM | POA: Insufficient documentation

## 2021-10-30 DIAGNOSIS — R21 Rash and other nonspecific skin eruption: Secondary | ICD-10-CM | POA: Insufficient documentation

## 2021-10-30 DIAGNOSIS — C3492 Malignant neoplasm of unspecified part of left bronchus or lung: Secondary | ICD-10-CM

## 2021-10-30 DIAGNOSIS — E1142 Type 2 diabetes mellitus with diabetic polyneuropathy: Secondary | ICD-10-CM | POA: Insufficient documentation

## 2021-10-30 DIAGNOSIS — E1165 Type 2 diabetes mellitus with hyperglycemia: Secondary | ICD-10-CM | POA: Insufficient documentation

## 2021-10-30 DIAGNOSIS — Z510 Encounter for antineoplastic radiation therapy: Secondary | ICD-10-CM | POA: Insufficient documentation

## 2021-10-30 DIAGNOSIS — E871 Hypo-osmolality and hyponatremia: Secondary | ICD-10-CM | POA: Insufficient documentation

## 2021-10-30 DIAGNOSIS — F17210 Nicotine dependence, cigarettes, uncomplicated: Secondary | ICD-10-CM | POA: Insufficient documentation

## 2021-10-30 DIAGNOSIS — N400 Enlarged prostate without lower urinary tract symptoms: Secondary | ICD-10-CM | POA: Insufficient documentation

## 2021-10-30 DIAGNOSIS — I10 Essential (primary) hypertension: Secondary | ICD-10-CM | POA: Insufficient documentation

## 2021-10-30 DIAGNOSIS — Z79899 Other long term (current) drug therapy: Secondary | ICD-10-CM | POA: Insufficient documentation

## 2021-10-30 DIAGNOSIS — J069 Acute upper respiratory infection, unspecified: Secondary | ICD-10-CM | POA: Insufficient documentation

## 2021-10-30 DIAGNOSIS — K5900 Constipation, unspecified: Secondary | ICD-10-CM | POA: Insufficient documentation

## 2021-10-30 DIAGNOSIS — Z923 Personal history of irradiation: Secondary | ICD-10-CM | POA: Insufficient documentation

## 2021-10-30 LAB — URINALYSIS, DIPSTICK ONLY
Bilirubin Urine: NEGATIVE
Glucose, UA: NEGATIVE mg/dL
Hgb urine dipstick: NEGATIVE
Ketones, ur: NEGATIVE mg/dL
Nitrite: NEGATIVE
Protein, ur: 100 mg/dL — AB
Specific Gravity, Urine: 1.01 (ref 1.005–1.030)
pH: 7 (ref 5.0–8.0)

## 2021-10-30 LAB — CBC WITH DIFFERENTIAL/PLATELET
Abs Immature Granulocytes: 0.14 10*3/uL — ABNORMAL HIGH (ref 0.00–0.07)
Basophils Absolute: 0.2 10*3/uL — ABNORMAL HIGH (ref 0.0–0.1)
Basophils Relative: 2 %
Eosinophils Absolute: 0 10*3/uL (ref 0.0–0.5)
Eosinophils Relative: 0 %
HCT: 45.3 % (ref 39.0–52.0)
Hemoglobin: 15.1 g/dL (ref 13.0–17.0)
Immature Granulocytes: 1 %
Lymphocytes Relative: 8 %
Lymphs Abs: 0.9 10*3/uL (ref 0.7–4.0)
MCH: 29 pg (ref 26.0–34.0)
MCHC: 33.3 g/dL (ref 30.0–36.0)
MCV: 87.1 fL (ref 80.0–100.0)
Monocytes Absolute: 0.8 10*3/uL (ref 0.1–1.0)
Monocytes Relative: 7 %
Neutro Abs: 9.8 10*3/uL — ABNORMAL HIGH (ref 1.7–7.7)
Neutrophils Relative %: 82 %
Platelets: 330 10*3/uL (ref 150–400)
RBC: 5.2 MIL/uL (ref 4.22–5.81)
RDW: 14.6 % (ref 11.5–15.5)
WBC: 12 10*3/uL — ABNORMAL HIGH (ref 4.0–10.5)
nRBC: 0 % (ref 0.0–0.2)

## 2021-10-30 LAB — COMPREHENSIVE METABOLIC PANEL
ALT: 13 U/L (ref 0–44)
AST: 18 U/L (ref 15–41)
Albumin: 3.9 g/dL (ref 3.5–5.0)
Alkaline Phosphatase: 87 U/L (ref 38–126)
Anion gap: 12 (ref 5–15)
BUN: 9 mg/dL (ref 8–23)
CO2: 28 mmol/L (ref 22–32)
Calcium: 9.3 mg/dL (ref 8.9–10.3)
Chloride: 91 mmol/L — ABNORMAL LOW (ref 98–111)
Creatinine, Ser: 0.51 mg/dL — ABNORMAL LOW (ref 0.61–1.24)
GFR, Estimated: >60 mL/min (ref >60)
Glucose, Bld: 143 mg/dL — ABNORMAL HIGH (ref 70–99)
Potassium: 3.6 mmol/L (ref 3.5–5.1)
Sodium: 131 mmol/L — ABNORMAL LOW (ref 135–145)
Total Bilirubin: 0.6 mg/dL (ref 0.3–1.2)
Total Protein: 7 g/dL (ref 6.5–8.1)

## 2021-10-30 LAB — TSH: TSH: 7.605 u[IU]/mL — ABNORMAL HIGH (ref 0.350–4.500)

## 2021-10-30 MED ORDER — FENTANYL 25 MCG/HR TD PT72: 1.0000 | MEDICATED_PATCH | TRANSDERMAL | 0 refills | Status: DC

## 2021-10-30 MED ORDER — HYDROCODONE-ACETAMINOPHEN 5-325 MG PO TABS: 2.0000 | ORAL_TABLET | ORAL | 0 refills | Status: DC | PRN

## 2021-10-31 ENCOUNTER — Other Ambulatory Visit: Payer: Self-pay

## 2021-10-31 ENCOUNTER — Ambulatory Visit
Admission: RE | Admit: 2021-10-31 | Discharge: 2021-10-31 | Disposition: A | Discharge status: Home / Self Care | Payer: 59 | Source: Ambulatory Visit | Attending: Radiation Oncology | Admitting: Radiation Oncology

## 2021-10-31 DIAGNOSIS — Z510 Encounter for antineoplastic radiation therapy: Secondary | ICD-10-CM | POA: Diagnosis not present

## 2021-10-31 LAB — T4: T4, Total: 10 ug/dL (ref 4.5–12.0)

## 2021-10-31 LAB — RAD ONC ARIA SESSION SUMMARY
Course Elapsed Days: 0
Plan Fractions Treated to Date: 1
Plan Prescribed Dose Per Fraction: 12 Gy
Plan Total Fractions Prescribed: 5
Plan Total Prescribed Dose: 60 Gy
Reference Point Dosage Given to Date: 12 Gy
Reference Point Session Dosage Given: 12 Gy
Session Number: 1

## 2021-11-02 ENCOUNTER — Ambulatory Visit
Admission: RE | Admit: 2021-11-02 | Discharge: 2021-11-02 | Disposition: A | Discharge status: Home / Self Care | Payer: 59 | Source: Ambulatory Visit | Attending: Radiation Oncology | Admitting: Radiation Oncology

## 2021-11-02 ENCOUNTER — Other Ambulatory Visit: Payer: Self-pay

## 2021-11-02 DIAGNOSIS — Z510 Encounter for antineoplastic radiation therapy: Secondary | ICD-10-CM | POA: Diagnosis not present

## 2021-11-02 LAB — RAD ONC ARIA SESSION SUMMARY
Course Elapsed Days: 2
Plan Fractions Treated to Date: 2
Plan Prescribed Dose Per Fraction: 12 Gy
Plan Total Fractions Prescribed: 5
Plan Total Prescribed Dose: 60 Gy
Reference Point Dosage Given to Date: 24 Gy
Reference Point Session Dosage Given: 12 Gy
Session Number: 2

## 2021-11-05 ENCOUNTER — Ambulatory Visit
Admission: RE | Admit: 2021-11-05 | Discharge: 2021-11-05 | Disposition: A | Discharge status: Home / Self Care | Payer: 59 | Source: Ambulatory Visit | Attending: Radiation Oncology | Admitting: Radiation Oncology

## 2021-11-05 ENCOUNTER — Other Ambulatory Visit: Payer: Self-pay

## 2021-11-05 DIAGNOSIS — Z510 Encounter for antineoplastic radiation therapy: Secondary | ICD-10-CM | POA: Diagnosis not present

## 2021-11-05 DIAGNOSIS — C3492 Malignant neoplasm of unspecified part of left bronchus or lung: Secondary | ICD-10-CM

## 2021-11-05 LAB — RAD ONC ARIA SESSION SUMMARY
Course Elapsed Days: 5
Plan Fractions Treated to Date: 3
Plan Prescribed Dose Per Fraction: 12 Gy
Plan Total Fractions Prescribed: 5
Plan Total Prescribed Dose: 60 Gy
Reference Point Dosage Given to Date: 36 Gy
Reference Point Session Dosage Given: 12 Gy
Session Number: 3

## 2021-11-07 ENCOUNTER — Inpatient Hospital Stay (HOSPITAL_BASED_OUTPATIENT_CLINIC_OR_DEPARTMENT_OTHER): Payer: 59 | Admitting: Hospice and Palliative Medicine

## 2021-11-07 ENCOUNTER — Other Ambulatory Visit: Payer: Self-pay

## 2021-11-07 ENCOUNTER — Ambulatory Visit
Admission: RE | Admit: 2021-11-07 | Discharge: 2021-11-07 | Disposition: A | Discharge status: Home / Self Care | Payer: 59 | Source: Ambulatory Visit | Attending: Radiation Oncology | Admitting: Radiation Oncology

## 2021-11-07 ENCOUNTER — Inpatient Hospital Stay: Payer: 59

## 2021-11-07 VITALS — BP 162/97 | HR 101 | Temp 98.4°F | Resp 16

## 2021-11-07 DIAGNOSIS — C3492 Malignant neoplasm of unspecified part of left bronchus or lung: Secondary | ICD-10-CM

## 2021-11-07 DIAGNOSIS — J069 Acute upper respiratory infection, unspecified: Secondary | ICD-10-CM | POA: Diagnosis not present

## 2021-11-07 DIAGNOSIS — Z510 Encounter for antineoplastic radiation therapy: Secondary | ICD-10-CM | POA: Diagnosis not present

## 2021-11-07 LAB — CBC WITH DIFFERENTIAL/PLATELET
Abs Immature Granulocytes: 0.08 10*3/uL — ABNORMAL HIGH (ref 0.00–0.07)
Basophils Absolute: 0.1 10*3/uL (ref 0.0–0.1)
Basophils Relative: 1 %
Eosinophils Absolute: 0.2 10*3/uL (ref 0.0–0.5)
Eosinophils Relative: 1 %
HCT: 44.8 % (ref 39.0–52.0)
Hemoglobin: 15.2 g/dL (ref 13.0–17.0)
Immature Granulocytes: 1 %
Lymphocytes Relative: 6 %
Lymphs Abs: 0.7 10*3/uL (ref 0.7–4.0)
MCH: 29.7 pg (ref 26.0–34.0)
MCHC: 33.9 g/dL (ref 30.0–36.0)
MCV: 87.5 fL (ref 80.0–100.0)
Monocytes Absolute: 0.8 10*3/uL (ref 0.1–1.0)
Monocytes Relative: 7 %
Neutro Abs: 9.4 10*3/uL — ABNORMAL HIGH (ref 1.7–7.7)
Neutrophils Relative %: 84 %
Platelets: 240 10*3/uL (ref 150–400)
RBC: 5.12 MIL/uL (ref 4.22–5.81)
RDW: 14.8 % (ref 11.5–15.5)
WBC: 11.2 10*3/uL — ABNORMAL HIGH (ref 4.0–10.5)
nRBC: 0 % (ref 0.0–0.2)

## 2021-11-07 LAB — COMPREHENSIVE METABOLIC PANEL
ALT: 13 U/L (ref 0–44)
AST: 14 U/L — ABNORMAL LOW (ref 15–41)
Albumin: 3.7 g/dL (ref 3.5–5.0)
Alkaline Phosphatase: 81 U/L (ref 38–126)
Anion gap: 8 (ref 5–15)
BUN: 11 mg/dL (ref 8–23)
CO2: 31 mmol/L (ref 22–32)
Calcium: 9.2 mg/dL (ref 8.9–10.3)
Chloride: 90 mmol/L — ABNORMAL LOW (ref 98–111)
Creatinine, Ser: 0.71 mg/dL (ref 0.61–1.24)
GFR, Estimated: >60 mL/min (ref >60)
Glucose, Bld: 166 mg/dL — ABNORMAL HIGH (ref 70–99)
Potassium: 4.1 mmol/L (ref 3.5–5.1)
Sodium: 129 mmol/L — ABNORMAL LOW (ref 135–145)
Total Bilirubin: 0.7 mg/dL (ref 0.3–1.2)
Total Protein: 6.7 g/dL (ref 6.5–8.1)

## 2021-11-07 LAB — RAD ONC ARIA SESSION SUMMARY
Course Elapsed Days: 7
Plan Fractions Treated to Date: 4
Plan Prescribed Dose Per Fraction: 12 Gy
Plan Total Fractions Prescribed: 5
Plan Total Prescribed Dose: 60 Gy
Reference Point Dosage Given to Date: 48 Gy
Reference Point Session Dosage Given: 12 Gy
Session Number: 4

## 2021-11-07 MED ORDER — AMOXICILLIN-POT CLAVULANATE 875-125 MG PO TABS: 1.0000 | ORAL_TABLET | Freq: Two times a day (BID) | ORAL | 0 refills | Status: DC

## 2021-11-07 NOTE — Progress Notes (Signed)
Pt comes in to [facility] this morning with concerns of nausea, SOB with excessive phlegm production, and high blood pressure. He is taking his otc antiemetics prn. Denies fever. Has had a poor appetite, but endorses good fluid intake. Rates pain 6/10 to right neck/shoulder area.

## 2021-11-07 NOTE — Progress Notes (Signed)
Symptom Management [place] at [facility] Telephone:([phone] Fax:([phone]) [phone]  Patient Care Team: Lynnell Jude, MD as PCP - General (Family Medicine) Lloyd Huger, MD as Consulting Physician (Oncology) [name], MD as Referring Physician (Radiation Oncology) Telford Nab, RN as Registered Nurse   Name of the patient: Gabriel Mendez  [id number]  08-Mar-1961   Date of visit: 11/07/21  Reason for Consult: Gabriel Mendez is a 61 y.o. 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  PET scan on September 27, 2019 confirmed recurrence.  Patient completed XRT in June 2021 for local recurrence and then CT scan in March 2022 revealed progression.  He is now on treatment with concurrent chemoradiation.   Patient presents to [facility] today with complaint of 3 to 4 days of sinus congestion, postnasal drip, productive cough, shortness of breath.  He denies fever or chills.  He does feel like he has nighttime wheezing at times but this is resolved with use of his albuterol inhaler.    He says he feels rundown after chemotherapy and is considering not pursuing further treatment.  Denies any neurologic complaints. Denies recent fevers or illnesses. Denies any easy bleeding or bruising. Reports good appetite and denies weight loss. Denies chest pain. Denies any nausea, vomiting, constipation, or diarrhea. Denies urinary complaints. Patient offers no further specific complaints today.  PAST MEDICAL HISTORY: Past Medical History:  Diagnosis Date   Asthma    Cancer associated pain    COPD (chronic obstructive pulmonary disease) ([place])    Diabetes mellitus without complication ([place])    Hypertension    Lung cancer ([place]) 05/2017   Hx Chemo + rad tx's.   Pneumonia     PAST SURGICAL HISTORY:  Past  Surgical History:  Procedure Laterality Date   ENDOBRONCHIAL ULTRASOUND N/A 05/12/2017   Procedure: ENDOBRONCHIAL ULTRASOUND;  Surgeon: Laverle Hobby, MD;  Location: [facility] ORS;  Service: Pulmonary;  Laterality: N/A;   FRACTURE SURGERY Left    ANKLE X 2   TONSILLECTOMY      HEMATOLOGY/ONCOLOGY HISTORY:  Oncology History  Squamous cell lung cancer, left ([place])  05/16/2017 Initial Diagnosis   Squamous cell lung cancer, left ([place])    09/11/2017 - 08/27/2018 Chemotherapy   Patient is on Treatment Plan : LUNG DURVALUMAB H85I      07/17/2021 -  Chemotherapy   Patient is on Treatment Plan : LUNG Docetaxel + Ramucirumab q21d         ALLERGIES:  is allergic to shrimp [shellfish allergy].  MEDICATIONS:  Current Outpatient Medications  Medication Sig Dispense Refill   albuterol (PROVENTIL) (2.5 MG/3ML) 0.083% nebulizer solution Take 2.5 mg by nebulization every 6 (six) hours as needed for wheezing or shortness of breath.     albuterol (VENTOLIN HFA) 108 (90 Base) MCG/ACT inhaler Inhale 2 puffs into the lungs every 6 (six) hours as needed for wheezing or shortness of breath. 1 Inhaler 0   ALPRAZolam (XANAX) 0.5 MG tablet Take 1 tablet (0.5 mg total) by mouth as needed for anxiety (Before radiation treatments). (Patient not taking: Reported on 02/28/2021) 10 tablet 0   amLODipine (NORVASC) 5 MG tablet Take 10 mg by mouth daily.     atorvastatin (LIPITOR) 20 MG tablet Take 20 mg by mouth daily.     baclofen (LIORESAL) 10 MG tablet TAKE 1 TABLET(10  MG) BY MOUTH THREE TIMES DAILY 90 tablet 1   fentaNYL (DURAGESIC) 25 MCG/HR Place 1 patch onto the skin every 3 (three) days. 10 patch 0   glipiZIDE (GLUCOTROL) 5 MG tablet Take 10 mg by mouth daily before breakfast.      HYDROcodone-acetaminophen (NORCO) 5-325 MG tablet Take 2 tablets by mouth every 4 (four) hours as needed for moderate pain. 90 tablet 0   ibuprofen (ADVIL,MOTRIN) 200 MG tablet Take 400-800 mg by mouth every 8 (eight) hours as  needed (for pain.).     lisinopril-hydrochlorothiazide (ZESTORETIC) 20-25 MG tablet Take 1 tablet by mouth daily. 30 tablet 0   metFORMIN (GLUCOPHAGE) 1000 MG tablet Take 1 tablet (1,000 mg total) by mouth 2 (two) times daily. 60 tablet 0   omeprazole (PRILOSEC) 20 MG capsule TAKE 1 CAPSULE(20 MG) BY MOUTH TWICE DAILY 60 capsule 2   ondansetron (ZOFRAN) 8 MG tablet Take 1 tablet (8 mg total) by mouth 2 (two) times daily as needed for refractory nausea / vomiting. 60 tablet 2   prochlorperazine (COMPAZINE) 10 MG tablet Take 1 tablet (10 mg total) by mouth every 6 (six) hours as needed (Nausea or vomiting). 60 tablet 2   TRELEGY ELLIPTA 200-62.5-25 MCG/INH AEPB Take 1 puff by mouth daily.     No current facility-administered medications for this visit.    VITAL SIGNS: BP (!) 162/97   Pulse (!) 101   Temp 98.4 F (36.9 C) (Tympanic)   Resp 16   SpO2 100%  There were no vitals filed for this visit.  Estimated body mass index is 28.92 kg/m as calculated from the following:   Height as of 10/09/21: 5\' 8"  (1.727 m).   Weight as of 10/30/21: 190 lb 3.2 oz (86.3 kg).  LABS: CBC:    Component Value Date/Time   WBC 11.2 (H) 11/07/2021 0818   HGB 15.2 11/07/2021 0818   HCT 44.8 11/07/2021 0818   PLT 240 11/07/2021 0818   MCV 87.5 11/07/2021 0818   NEUTROABS 9.4 (H) 11/07/2021 0818   LYMPHSABS 0.7 11/07/2021 0818   MONOABS 0.8 11/07/2021 0818   EOSABS 0.2 11/07/2021 0818   BASOSABS 0.1 11/07/2021 0818   Comprehensive Metabolic Panel:    Component Value Date/Time   NA 129 (L) 11/07/2021 0818   K 4.1 11/07/2021 0818   CL 90 (L) 11/07/2021 0818   CO2 31 11/07/2021 0818   BUN 11 11/07/2021 0818   CREATININE 0.71 11/07/2021 0818   GLUCOSE 166 (H) 11/07/2021 0818   CALCIUM 9.2 11/07/2021 0818   AST 14 (L) 11/07/2021 0818   ALT 13 11/07/2021 0818   ALKPHOS 81 11/07/2021 0818   BILITOT 0.7 11/07/2021 0818   PROT 6.7 11/07/2021 0818   ALBUMIN 3.7 11/07/2021 0818    RADIOGRAPHIC  STUDIES: No results found.  PERFORMANCE STATUS (ECOG) : 1 - Symptomatic but completely ambulatory  Review of Systems Unless otherwise noted, a complete review of systems is negative.  Physical Exam General: NAD Cardiovascular: regular rate and rhythm Pulmonary: clear anterior/posterior fields Abdomen: soft, nontender, + bowel sounds GU: no suprapubic tenderness Extremities: no edema, no joint deformities Skin: no rashes Neurological: Weakness but otherwise nonfocal  Assessment and Plan- Patient is a 61 y.o. 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  PET scan on September 27, 2019 confirmed recurrence.  Patient completed XRT in  June 2021 for local recurrence and then CT scan in March 2022 revealed progression.  He is now on treatment with concurrent chemoradiation.    URI -we will start empiric antibiotics given high risk from lung cancer and cancer treatments.  Start Augmentin twice daily x10 days.  Recommended continued use of his inhaler as needed.  Recommend starting Mucinex.  Discussed symptomatic measures and ER precautions.  I also recommended patient obtain COVID PCR testing and patient was provided with information regarding testing facilities.  Lung cancer -patient states that he is considering not pursuing further chemotherapy.  I encouraged him to keep follow-up appointment with Dr. Grayland Ormond on 6/6 to further discuss goals.  Case and plan discussed with Dr. Grayland Ormond   Patient expressed understanding and was in agreement with this plan. He also understands that He can call clinic at any time with any questions, concerns, or complaints.   Thank you for allowing me to participate in the care of this very pleasant patient.   Time Total: 15 minutes  Visit consisted of counseling and education dealing with the  complex and emotionally intense issues of symptom management in the setting of serious illness.Greater than 50%  of this time was spent counseling and coordinating care related to the above assessment and plan.  Signed by: Altha Harm, PhD, NP-C

## 2021-11-13 ENCOUNTER — Other Ambulatory Visit: Payer: Self-pay

## 2021-11-13 ENCOUNTER — Ambulatory Visit
Admission: RE | Admit: 2021-11-13 | Discharge: 2021-11-13 | Disposition: A | Discharge status: Home / Self Care | Payer: 59 | Source: Ambulatory Visit | Attending: Radiation Oncology | Admitting: Radiation Oncology

## 2021-11-13 DIAGNOSIS — Z510 Encounter for antineoplastic radiation therapy: Secondary | ICD-10-CM | POA: Diagnosis not present

## 2021-11-13 LAB — RAD ONC ARIA SESSION SUMMARY
Course Elapsed Days: 13
Plan Fractions Treated to Date: 5
Plan Prescribed Dose Per Fraction: 12 Gy
Plan Total Fractions Prescribed: 5
Plan Total Prescribed Dose: 60 Gy
Reference Point Dosage Given to Date: 60 Gy
Reference Point Session Dosage Given: 12 Gy
Session Number: 5

## 2021-11-19 MED FILL — Dexamethasone Sodium Phosphate Inj 100 MG/10ML: INTRAMUSCULAR | Qty: 1 | Status: AC

## 2021-11-19 NOTE — Progress Notes (Unsigned)
[place]  Telephone:(336) [phone] Fax:(336) [phone]  ID: PERETZ THIEME OB: [birth year]  MR#: [id number]  IRC#:[id number]  Patient Care Team: Lynnell Jude, MD as PCP - General (Family Medicine) Lloyd Huger, MD as Consulting Physician (Oncology) [name], MD as Referring Physician (Radiation Oncology) Telford Nab, RN as Registered Nurse   CHIEF COMPLAINT: Recurrent squamous cell carcinoma of the left lung, all molecular markers and PD-L1 are negative.  INTERVAL HISTORY: Patient returns to clinic today for further evaluation and resumption of Taxotere and Cyramza.  Patient recently completed 5 treatments of SBRT.  He states he still feels weak and fatigued from his chemotherapy nearly 6 weeks ago.  He does not wish to restart Taxotere.  He continues to have neck pain unrelated to his malignancy.  His peripheral neuropathy is unchanged.  He has no other neurologic complaints.  He has a good appetite and denies weight loss. He denies any chest pain, hemoptysis, or shortness of breath.  He continues to have chronic cough.  He denies any nausea, vomiting, constipation, or diarrhea.  He has no urinary complaints.  Patient offers no further specific complaints today.  REVIEW OF SYSTEMS:   Review of Systems  Constitutional:  Positive for malaise/fatigue. Negative for fever and weight loss.  HENT: Negative.  Negative for congestion.   Respiratory: Negative.  Negative for cough, hemoptysis and shortness of breath.   Cardiovascular: Negative.  Negative for chest pain and leg swelling.  Gastrointestinal:  Negative for abdominal pain, constipation, heartburn, nausea and vomiting.  Genitourinary: Negative.  Negative for dysuria.  Musculoskeletal:  Positive for joint pain and neck pain. Negative for back pain.  Skin: Negative.  Negative for itching and rash.  Neurological:  Positive for sensory change and weakness. Negative for dizziness, focal weakness and  headaches.  Psychiatric/Behavioral: Negative.  The patient is not nervous/anxious.    As per HPI. Otherwise, a complete review of systems is negative.  PAST MEDICAL HISTORY: Past Medical History:  Diagnosis Date   Asthma    Cancer associated pain    COPD (chronic obstructive pulmonary disease) ([place])    Diabetes mellitus without complication ([place])    Hypertension    Lung cancer ([place]) 05/2017   Hx Chemo + rad tx's.   Pneumonia     PAST SURGICAL HISTORY: Past Surgical History:  Procedure Laterality Date   ENDOBRONCHIAL ULTRASOUND N/A 05/12/2017   Procedure: ENDOBRONCHIAL ULTRASOUND;  Surgeon: Laverle Hobby, MD;  Location: [facility] ORS;  Service: Pulmonary;  Laterality: N/A;   FRACTURE SURGERY Left    ANKLE X 2   TONSILLECTOMY      FAMILY HISTORY: Family History  Problem Relation Age of Onset   Stroke Mother    Diabetes Mother    Hypertension Mother    Diabetes Father    Hypertension Father    Diabetes Sister    Diabetes Brother    Heart attack Paternal Uncle    Stroke Maternal Grandmother     ADVANCED DIRECTIVES (Y/N):  N  HEALTH MAINTENANCE: Social History   Tobacco Use   Smoking status: Some Days    Packs/day: 0.50    Types: Cigarettes   Smokeless tobacco: Never  Vaping Use   Vaping Use: Never used  Substance Use Topics   Alcohol use: No   Drug use: No     Colonoscopy:  PAP:  Bone density:  Lipid panel:  Allergies  Allergen Reactions   Shrimp [Shellfish Allergy] Nausea And Vomiting    Current Outpatient  Medications  Medication Sig Dispense Refill   albuterol (PROVENTIL) (2.5 MG/3ML) 0.083% nebulizer solution Take 2.5 mg by nebulization every 6 (six) hours as needed for wheezing or shortness of breath.     albuterol (VENTOLIN HFA) 108 (90 Base) MCG/ACT inhaler Inhale 2 puffs into the lungs every 6 (six) hours as needed for wheezing or shortness of breath. 1 Inhaler 0   amLODipine (NORVASC) 10 MG tablet Take 10 mg by mouth daily.      atorvastatin (LIPITOR) 20 MG tablet Take 20 mg by mouth daily.     glipiZIDE (GLUCOTROL) 5 MG tablet Take 10 mg by mouth daily before breakfast.      ibuprofen (ADVIL,MOTRIN) 200 MG tablet Take 400-800 mg by mouth every 8 (eight) hours as needed (for pain.).     lisinopril-hydrochlorothiazide (ZESTORETIC) 20-25 MG tablet Take 1 tablet by mouth daily. 30 tablet 0   metFORMIN (GLUCOPHAGE) 1000 MG tablet Take 1 tablet (1,000 mg total) by mouth 2 (two) times daily. 60 tablet 0   omeprazole (PRILOSEC) 20 MG capsule TAKE 1 CAPSULE(20 MG) BY MOUTH TWICE DAILY 60 capsule 2   ondansetron (ZOFRAN) 8 MG tablet Take 1 tablet (8 mg total) by mouth 2 (two) times daily as needed for refractory nausea / vomiting. 60 tablet 2   prochlorperazine (COMPAZINE) 10 MG tablet Take 1 tablet (10 mg total) by mouth every 6 (six) hours as needed (Nausea or vomiting). 60 tablet 2   TRELEGY ELLIPTA 200-62.5-25 MCG/INH AEPB Take 1 puff by mouth daily.     ALPRAZolam (XANAX) 0.5 MG tablet Take 1 tablet (0.5 mg total) by mouth as needed for anxiety (Before radiation treatments). (Patient not taking: Reported on 02/28/2021) 10 tablet 0   amLODipine (NORVASC) 5 MG tablet Take 10 mg by mouth daily. (Patient not taking: Reported on 11/20/2021)     baclofen (LIORESAL) 10 MG tablet TAKE 1 TABLET(10 MG) BY MOUTH THREE TIMES DAILY (Patient not taking: Reported on 11/20/2021) 90 tablet 1   fentaNYL (DURAGESIC) 25 MCG/HR Place 1 patch onto the skin every 3 (three) days. 10 patch 0   HYDROcodone-acetaminophen (NORCO) 5-325 MG tablet Take 2 tablets by mouth every 4 (four) hours as needed for moderate pain. 90 tablet 0   No current facility-administered medications for this visit.    OBJECTIVE: Vitals:   11/20/21 0903  BP: (!) 150/87  Pulse: (!) 102  Resp: 20  Temp: (!) 96.6 F (35.9 C)  SpO2: 97%      Body mass index is 28.39 kg/m.    ECOG FS:1 - Symptomatic but completely ambulatory  General: Well-developed, well-nourished, no acute  distress. Eyes: Pink conjunctiva, anicteric sclera. HEENT: Normocephalic, moist mucous membranes. Lungs: No audible wheezing or coughing. Heart: Regular rate and rhythm. Abdomen: Soft, nontender, no obvious distention. Musculoskeletal: No edema, cyanosis, or clubbing. Neuro: Alert, answering all questions appropriately. Cranial nerves grossly intact. Skin: No rashes or petechiae noted. Psych: Normal affect.  LAB RESULTS:  Lab Results  Component Value Date   NA 128 (L) 11/20/2021   K 3.8 11/20/2021   CL 91 (L) 11/20/2021   CO2 28 11/20/2021   GLUCOSE 157 (H) 11/20/2021   BUN 18 11/20/2021   CREATININE 0.78 11/20/2021   CALCIUM 9.0 11/20/2021   PROT 7.0 11/20/2021   ALBUMIN 4.0 11/20/2021   AST 15 11/20/2021   ALT 15 11/20/2021   ALKPHOS 67 11/20/2021   BILITOT 0.3 11/20/2021   GFRNONAA >60 11/20/2021   GFRAA >60 11/12/2019    Lab  Results  Component Value Date   WBC 12.8 (H) 11/20/2021   NEUTROABS 11.2 (H) 11/20/2021   HGB 15.0 11/20/2021   HCT 44.8 11/20/2021   MCV 87.7 11/20/2021   PLT 300 11/20/2021     STUDIES: No results found.  ONCOLOGY HISTORY: Patient initially underwent treatment with weekly carboplatinum and Taxol along with daily XRT.  He subsequently completed 1 year of maintenance durvalumab on August 27, 2018. CT scan on September 08, 2019 revealed progressive lymphadenopathy in his right supraclavicular and right thoracic inlet.  PET scan completed on September 27, 2019 confirmed likely recurrence.  Biopsy on October 04, 2019 consistent with squamous cell carcinoma. He completed XRT only for his local recurrence on November 17, 2019.  Repeat CT scan on August 28, 2020 with continued progression of 3 pulmonary nodules in his left and right upper lobe as well as right lower lobe.  PET scan results completed on September 18, 2020 revealed a right lower lobe nodule has a minimally elevated SUV of 2.9, the right upper lobe nodule has an SUV of 7.7 and additional 11 mm nodule in the  left apex as an SUV of 5.3.  Patient has now completed XRT to these lesions.   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  PLAN:    1.  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  PET scan results from July 05, 2021 reviewed independently with 4.6 cm left upper lobe mass substantially increased in size and hypermetabolism.  No additional sites of hypermetabolic disease are noted.  Given previous XRT to this area, patient agreed to systemic chemotherapy using Taxotere and Cyramza every 3 weeks.  Repeat PET scan on October 08, 2021 was essentially unchanged may be mildly progressive disease.   Patient was subsequently referred back to radiation oncology and has now completed 5 doses of SBRT to his residual lesion.  Patient has declined further chemotherapy at this time, therefore we will do a PET scan in approximately 5 weeks to assess if more treatment is necessary.  Return to clinic 2 to 3 days after imaging to discuss the results.  2.  Hypertension: Blood pressure moderately elevated today.  Continue evaluation and treatment per primary care. 3.  Cough: Chronic and unchanged.  Continue OTC medications as needed.  Patient was instructed to use his inhaler more frequently. 4.  Shoulder/neck pain: Continue fentanyl patch and hydrocodone as needed.  XRT as above.  Patient states he feels his pain is musculoskeletal in nature from his neck and plans to further discuss with primary care. 5.  Hyperglycemia:  Patient has improved blood glucose control.  Continue evaluation and treatment per primary care. 6.  Reflux: Resolved.  Continue omeprazole as prescribed.   7.  Nausea and vomiting: Resolved.  Patient has been instructed to take his antiemetics regularly after each chemotherapy  cycle. 8.  Peripheral neuropathy: Chronic and unchanged.  Taxotere has been dose reduced.  Hydrocodone as above.  Can consider gabapentin or Lyrica in the future. 9.  Hyponatremia: Chronic and unchanged.  Patient sodium is 128. 10.  Leukocytosis: Chronic and unchanged. 11.  Constipation: Patient does not complain of this today.  Continue MiraLAX as directed. 12.  Rash: Patient does not complain of this today.  Continue OTC treatments as needed.  13: Urinary complaints: Appears to be related to BPH.  Patient was previously offered a referral to urology which he has declined.   Patient expressed understanding and was in agreement with this plan. He also understands that He can call clinic at any time with any questions, concerns, or complaints.    Cancer Staging  Squamous cell lung cancer, left [facility]) Staging form: Lung, AJCC 8th Edition - Clinical stage from 05/16/2017: Stage IIIB (cT3, cN2, cM0) - Signed by Lloyd Huger, MD on 05/16/2017   Lloyd Huger, MD   11/20/2021 12:31 PM

## 2021-11-20 ENCOUNTER — Inpatient Hospital Stay: Payer: 59 | Attending: Oncology

## 2021-11-20 ENCOUNTER — Encounter: Payer: Self-pay | Admitting: Oncology

## 2021-11-20 ENCOUNTER — Inpatient Hospital Stay (HOSPITAL_BASED_OUTPATIENT_CLINIC_OR_DEPARTMENT_OTHER): Payer: 59 | Admitting: Oncology

## 2021-11-20 ENCOUNTER — Inpatient Hospital Stay: Payer: 59

## 2021-11-20 ENCOUNTER — Other Ambulatory Visit: Payer: Self-pay

## 2021-11-20 VITALS — BP 150/87 | HR 102 | Temp 96.6°F | Resp 20 | Wt 186.7 lb

## 2021-11-20 DIAGNOSIS — M542 Cervicalgia: Secondary | ICD-10-CM | POA: Diagnosis not present

## 2021-11-20 DIAGNOSIS — E1142 Type 2 diabetes mellitus with diabetic polyneuropathy: Secondary | ICD-10-CM | POA: Diagnosis not present

## 2021-11-20 DIAGNOSIS — E871 Hypo-osmolality and hyponatremia: Secondary | ICD-10-CM | POA: Insufficient documentation

## 2021-11-20 DIAGNOSIS — Z79899 Other long term (current) drug therapy: Secondary | ICD-10-CM | POA: Diagnosis not present

## 2021-11-20 DIAGNOSIS — Z923 Personal history of irradiation: Secondary | ICD-10-CM | POA: Diagnosis not present

## 2021-11-20 DIAGNOSIS — C3492 Malignant neoplasm of unspecified part of left bronchus or lung: Secondary | ICD-10-CM

## 2021-11-20 DIAGNOSIS — E1165 Type 2 diabetes mellitus with hyperglycemia: Secondary | ICD-10-CM | POA: Diagnosis not present

## 2021-11-20 DIAGNOSIS — I10 Essential (primary) hypertension: Secondary | ICD-10-CM | POA: Diagnosis not present

## 2021-11-20 DIAGNOSIS — F17210 Nicotine dependence, cigarettes, uncomplicated: Secondary | ICD-10-CM | POA: Diagnosis not present

## 2021-11-20 DIAGNOSIS — C3412 Malignant neoplasm of upper lobe, left bronchus or lung: Secondary | ICD-10-CM | POA: Insufficient documentation

## 2021-11-20 DIAGNOSIS — D72829 Elevated white blood cell count, unspecified: Secondary | ICD-10-CM | POA: Diagnosis not present

## 2021-11-20 DIAGNOSIS — K5900 Constipation, unspecified: Secondary | ICD-10-CM | POA: Diagnosis not present

## 2021-11-20 LAB — COMPREHENSIVE METABOLIC PANEL
ALT: 15 U/L (ref 0–44)
AST: 15 U/L (ref 15–41)
Albumin: 4 g/dL (ref 3.5–5.0)
Alkaline Phosphatase: 67 U/L (ref 38–126)
Anion gap: 9 (ref 5–15)
BUN: 18 mg/dL (ref 8–23)
CO2: 28 mmol/L (ref 22–32)
Calcium: 9 mg/dL (ref 8.9–10.3)
Chloride: 91 mmol/L — ABNORMAL LOW (ref 98–111)
Creatinine, Ser: 0.78 mg/dL (ref 0.61–1.24)
GFR, Estimated: >60 mL/min (ref >60)
Glucose, Bld: 157 mg/dL — ABNORMAL HIGH (ref 70–99)
Potassium: 3.8 mmol/L (ref 3.5–5.1)
Sodium: 128 mmol/L — ABNORMAL LOW (ref 135–145)
Total Bilirubin: 0.3 mg/dL (ref 0.3–1.2)
Total Protein: 7 g/dL (ref 6.5–8.1)

## 2021-11-20 LAB — CBC WITH DIFFERENTIAL/PLATELET
Abs Immature Granulocytes: 0.06 10*3/uL (ref 0.00–0.07)
Basophils Absolute: 0.1 10*3/uL (ref 0.0–0.1)
Basophils Relative: 1 %
Eosinophils Absolute: 0.2 10*3/uL (ref 0.0–0.5)
Eosinophils Relative: 1 %
HCT: 44.8 % (ref 39.0–52.0)
Hemoglobin: 15 g/dL (ref 13.0–17.0)
Immature Granulocytes: 1 %
Lymphocytes Relative: 4 %
Lymphs Abs: 0.6 10*3/uL — ABNORMAL LOW (ref 0.7–4.0)
MCH: 29.4 pg (ref 26.0–34.0)
MCHC: 33.5 g/dL (ref 30.0–36.0)
MCV: 87.7 fL (ref 80.0–100.0)
Monocytes Absolute: 0.8 10*3/uL (ref 0.1–1.0)
Monocytes Relative: 6 %
Neutro Abs: 11.2 10*3/uL — ABNORMAL HIGH (ref 1.7–7.7)
Neutrophils Relative %: 87 %
Platelets: 300 10*3/uL (ref 150–400)
RBC: 5.11 MIL/uL (ref 4.22–5.81)
RDW: 15.3 % (ref 11.5–15.5)
WBC: 12.8 10*3/uL — ABNORMAL HIGH (ref 4.0–10.5)
nRBC: 0 % (ref 0.0–0.2)

## 2021-11-20 LAB — URINALYSIS, DIPSTICK ONLY
Bilirubin Urine: NEGATIVE
Glucose, UA: NEGATIVE mg/dL
Ketones, ur: NEGATIVE mg/dL
Nitrite: NEGATIVE
Protein, ur: >=300 mg/dL — AB
Specific Gravity, Urine: 1.013 (ref 1.005–1.030)
pH: 5 (ref 5.0–8.0)

## 2021-11-20 LAB — TSH: TSH: 4.288 u[IU]/mL (ref 0.350–4.500)

## 2021-11-20 MED ORDER — HYDROCODONE-ACETAMINOPHEN 5-325 MG PO TABS: 2.0000 | ORAL_TABLET | ORAL | 0 refills | Status: DC | PRN

## 2021-11-20 MED ORDER — FENTANYL 25 MCG/HR TD PT72: 1.0000 | MEDICATED_PATCH | TRANSDERMAL | 0 refills | Status: DC

## 2021-11-20 NOTE — Progress Notes (Signed)
  Pt states that his txs is making him weaker and weaker  and he does not believe his body can not handle it anymore, constantly SOB. Did not take any pain meds this morning because he did not want it to interact with his tx.

## 2021-11-21 LAB — T4: T4, Total: 11.3 ug/dL (ref 4.5–12.0)

## 2021-12-11 ENCOUNTER — Encounter: Payer: Self-pay | Admitting: Oncology

## 2021-12-12 ENCOUNTER — Ambulatory Visit: Payer: 59 | Admitting: Radiation Oncology

## 2021-12-13 ENCOUNTER — Other Ambulatory Visit: Payer: Self-pay | Admitting: *Deleted

## 2021-12-13 DIAGNOSIS — C3492 Malignant neoplasm of unspecified part of left bronchus or lung: Secondary | ICD-10-CM

## 2021-12-13 MED ORDER — FENTANYL 25 MCG/HR TD PT72: 1.0000 | MEDICATED_PATCH | TRANSDERMAL | 0 refills | Status: DC

## 2021-12-13 MED ORDER — HYDROCODONE-ACETAMINOPHEN 5-325 MG PO TABS: 2.0000 | ORAL_TABLET | ORAL | 0 refills | Status: DC | PRN

## 2021-12-20 ENCOUNTER — Encounter: Payer: Self-pay | Admitting: Oncology

## 2021-12-24 ENCOUNTER — Inpatient Hospital Stay: Payer: 59 | Attending: Oncology

## 2021-12-24 DIAGNOSIS — K219 Gastro-esophageal reflux disease without esophagitis: Secondary | ICD-10-CM | POA: Diagnosis not present

## 2021-12-24 DIAGNOSIS — K5900 Constipation, unspecified: Secondary | ICD-10-CM | POA: Diagnosis not present

## 2021-12-24 DIAGNOSIS — E1165 Type 2 diabetes mellitus with hyperglycemia: Secondary | ICD-10-CM | POA: Diagnosis not present

## 2021-12-24 DIAGNOSIS — E871 Hypo-osmolality and hyponatremia: Secondary | ICD-10-CM | POA: Diagnosis not present

## 2021-12-24 DIAGNOSIS — C3412 Malignant neoplasm of upper lobe, left bronchus or lung: Secondary | ICD-10-CM | POA: Diagnosis present

## 2021-12-24 DIAGNOSIS — D72829 Elevated white blood cell count, unspecified: Secondary | ICD-10-CM | POA: Diagnosis not present

## 2021-12-24 DIAGNOSIS — C3492 Malignant neoplasm of unspecified part of left bronchus or lung: Secondary | ICD-10-CM

## 2021-12-24 DIAGNOSIS — E1142 Type 2 diabetes mellitus with diabetic polyneuropathy: Secondary | ICD-10-CM | POA: Insufficient documentation

## 2021-12-24 DIAGNOSIS — M542 Cervicalgia: Secondary | ICD-10-CM | POA: Diagnosis not present

## 2021-12-24 DIAGNOSIS — Z79899 Other long term (current) drug therapy: Secondary | ICD-10-CM | POA: Insufficient documentation

## 2021-12-24 DIAGNOSIS — F17210 Nicotine dependence, cigarettes, uncomplicated: Secondary | ICD-10-CM | POA: Diagnosis not present

## 2021-12-24 DIAGNOSIS — I10 Essential (primary) hypertension: Secondary | ICD-10-CM | POA: Insufficient documentation

## 2021-12-24 LAB — URINALYSIS, DIPSTICK ONLY
Bilirubin Urine: NEGATIVE
Glucose, UA: NEGATIVE mg/dL
Ketones, ur: NEGATIVE mg/dL
Nitrite: NEGATIVE
Protein, ur: >300 mg/dL — AB
Specific Gravity, Urine: 1.025 (ref 1.005–1.030)
pH: 6 (ref 5.0–8.0)

## 2021-12-24 LAB — CBC WITH DIFFERENTIAL/PLATELET
Abs Immature Granulocytes: 0.07 10*3/uL (ref 0.00–0.07)
Basophils Absolute: 0.2 10*3/uL — ABNORMAL HIGH (ref 0.0–0.1)
Basophils Relative: 1 %
Eosinophils Absolute: 0.4 10*3/uL (ref 0.0–0.5)
Eosinophils Relative: 4 %
HCT: 45.3 % (ref 39.0–52.0)
Hemoglobin: 15.3 g/dL (ref 13.0–17.0)
Immature Granulocytes: 1 %
Lymphocytes Relative: 6 %
Lymphs Abs: 0.6 10*3/uL — ABNORMAL LOW (ref 0.7–4.0)
MCH: 29.6 pg (ref 26.0–34.0)
MCHC: 33.8 g/dL (ref 30.0–36.0)
MCV: 87.6 fL (ref 80.0–100.0)
Monocytes Absolute: 0.7 10*3/uL (ref 0.1–1.0)
Monocytes Relative: 6 %
Neutro Abs: 8.8 10*3/uL — ABNORMAL HIGH (ref 1.7–7.7)
Neutrophils Relative %: 82 %
Platelets: 296 10*3/uL (ref 150–400)
RBC: 5.17 MIL/uL (ref 4.22–5.81)
RDW: 13.8 % (ref 11.5–15.5)
WBC: 10.8 10*3/uL — ABNORMAL HIGH (ref 4.0–10.5)
nRBC: 0 % (ref 0.0–0.2)

## 2021-12-24 LAB — COMPREHENSIVE METABOLIC PANEL
ALT: 15 U/L (ref 0–44)
AST: 15 U/L (ref 15–41)
Albumin: 4.3 g/dL (ref 3.5–5.0)
Alkaline Phosphatase: 81 U/L (ref 38–126)
Anion gap: 13 (ref 5–15)
BUN: 20 mg/dL (ref 8–23)
CO2: 28 mmol/L (ref 22–32)
Calcium: 9.5 mg/dL (ref 8.9–10.3)
Chloride: 89 mmol/L — ABNORMAL LOW (ref 98–111)
Creatinine, Ser: 0.52 mg/dL — ABNORMAL LOW (ref 0.61–1.24)
GFR, Estimated: >60 mL/min (ref >60)
Glucose, Bld: 165 mg/dL — ABNORMAL HIGH (ref 70–99)
Potassium: 3.7 mmol/L (ref 3.5–5.1)
Sodium: 130 mmol/L — ABNORMAL LOW (ref 135–145)
Total Bilirubin: 0.7 mg/dL (ref 0.3–1.2)
Total Protein: 7.4 g/dL (ref 6.5–8.1)

## 2021-12-24 LAB — TSH: TSH: 3.965 u[IU]/mL (ref 0.350–4.500)

## 2021-12-25 ENCOUNTER — Ambulatory Visit
Admission: RE | Admit: 2021-12-25 | Discharge: 2021-12-25 | Disposition: A | Discharge status: Home / Self Care | Payer: 59 | Source: Ambulatory Visit | Attending: Oncology | Admitting: Oncology

## 2021-12-25 ENCOUNTER — Encounter: Payer: Self-pay | Admitting: Oncology

## 2021-12-25 DIAGNOSIS — C3492 Malignant neoplasm of unspecified part of left bronchus or lung: Secondary | ICD-10-CM | POA: Diagnosis not present

## 2021-12-25 DIAGNOSIS — J984 Other disorders of lung: Secondary | ICD-10-CM | POA: Diagnosis present

## 2021-12-25 DIAGNOSIS — Z923 Personal history of irradiation: Secondary | ICD-10-CM | POA: Insufficient documentation

## 2021-12-25 DIAGNOSIS — R911 Solitary pulmonary nodule: Secondary | ICD-10-CM | POA: Diagnosis not present

## 2021-12-25 LAB — GLUCOSE, CAPILLARY: Glucose-Capillary: 138 mg/dL — ABNORMAL HIGH (ref 70–99)

## 2021-12-25 LAB — T4: T4, Total: 10.4 ug/dL (ref 4.5–12.0)

## 2021-12-25 MED ORDER — FLUDEOXYGLUCOSE F - 18 (FDG) INJECTION
10.1400 | Freq: Once | INTRAVENOUS | Status: AC
  Administered 2021-12-25: 10.14 via INTRAVENOUS

## 2021-12-25 NOTE — Progress Notes (Unsigned)
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  INTERVAL HISTORY: Patient returns to clinic today for further evaluation and resumption of Taxotere and Cyramza.  Patient recently completed 5 treatments of SBRT.  He states he still feels weak and fatigued from his chemotherapy nearly 6 weeks ago.  He does not wish to restart Taxotere.  He continues to have neck pain unrelated to his malignancy.  His peripheral neuropathy is unchanged.  He has no other neurologic complaints.  He has a good appetite and denies weight loss. He denies any chest pain, hemoptysis, or shortness of breath.  He continues to have chronic cough.  He denies any nausea, vomiting, constipation, or diarrhea.  He has no urinary complaints.  Patient offers no further specific complaints today.  REVIEW OF SYSTEMS:   Review of Systems  Constitutional:  Positive for malaise/fatigue. Negative for fever and weight loss.  HENT: Negative.  Negative for congestion.   Respiratory: Negative.  Negative for cough, hemoptysis and shortness of breath.   Cardiovascular: Negative.  Negative for chest pain and leg swelling.  Gastrointestinal:  Negative for abdominal pain, constipation, heartburn, nausea and vomiting.  Genitourinary: Negative.  Negative for dysuria.  Musculoskeletal:  Positive for joint pain and neck pain. Negative for back pain.  Skin: Negative.  Negative for itching and rash.  Neurological:  Positive for sensory change and weakness. Negative for dizziness, focal weakness and  headaches.  Psychiatric/Behavioral: Negative.  The patient is not nervous/anxious.     As per HPI. Otherwise, a complete review of systems is negative.  PAST MEDICAL HISTORY: Past Medical History:  Diagnosis Date   Asthma    Cancer associated pain    COPD (chronic obstructive pulmonary disease) ([place])    Diabetes mellitus without complication ([place])    Hypertension    Lung cancer ([place]) 05/2017   Hx Chemo + rad tx's.   Pneumonia     PAST SURGICAL HISTORY: Past Surgical History:  Procedure Laterality Date   ENDOBRONCHIAL ULTRASOUND N/A 05/12/2017   Procedure: ENDOBRONCHIAL ULTRASOUND;  Surgeon: Laverle Hobby, MD;  Location: [facility] ORS;  Service: Pulmonary;  Laterality: N/A;   FRACTURE SURGERY Left    ANKLE X 2   TONSILLECTOMY      FAMILY HISTORY: Family History  Problem Relation Age of Onset   Stroke Mother    Diabetes Mother    Hypertension Mother    Diabetes Father    Hypertension Father    Diabetes Sister    Diabetes Brother    Heart attack Paternal Uncle    Stroke Maternal Grandmother     ADVANCED DIRECTIVES (Y/N):  N  HEALTH MAINTENANCE: Social History   Tobacco Use   Smoking status: Some Days    Packs/day: 0.50    Types: Cigarettes   Smokeless tobacco: Never  Vaping Use   Vaping Use: Never used  Substance Use Topics   Alcohol use: No   Drug use: No     Colonoscopy:  PAP:  Bone density:  Lipid panel:  Allergies  Allergen Reactions   Shrimp [Shellfish Allergy] Nausea And Vomiting    Current  Outpatient Medications  Medication Sig Dispense Refill   albuterol (PROVENTIL) (2.5 MG/3ML) 0.083% nebulizer solution Take 2.5 mg by nebulization every 6 (six) hours as needed for wheezing or shortness of breath.     albuterol (VENTOLIN HFA) 108 (90 Base) MCG/ACT inhaler Inhale 2 puffs into the lungs every 6 (six) hours as needed for wheezing or shortness of breath. 1 Inhaler 0   ALPRAZolam (XANAX) 0.5 MG tablet Take 1 tablet (0.5 mg total) by mouth  as needed for anxiety (Before radiation treatments). (Patient not taking: Reported on 02/28/2021) 10 tablet 0   amLODipine (NORVASC) 10 MG tablet Take 10 mg by mouth daily.     amLODipine (NORVASC) 5 MG tablet Take 10 mg by mouth daily. (Patient not taking: Reported on 11/20/2021)     atorvastatin (LIPITOR) 20 MG tablet Take 20 mg by mouth daily.     baclofen (LIORESAL) 10 MG tablet TAKE 1 TABLET(10 MG) BY MOUTH THREE TIMES DAILY (Patient not taking: Reported on 11/20/2021) 90 tablet 1   fentaNYL (DURAGESIC) 25 MCG/HR Place 1 patch onto the skin every 3 (three) days. 10 patch 0   glipiZIDE (GLUCOTROL) 5 MG tablet Take 10 mg by mouth daily before breakfast.      HYDROcodone-acetaminophen (NORCO) 5-325 MG tablet Take 2 tablets by mouth every 4 (four) hours as needed for moderate pain. 90 tablet 0   ibuprofen (ADVIL,MOTRIN) 200 MG tablet Take 400-800 mg by mouth every 8 (eight) hours as needed (for pain.).     lisinopril-hydrochlorothiazide (ZESTORETIC) 20-25 MG tablet Take 1 tablet by mouth daily. 30 tablet 0   metFORMIN (GLUCOPHAGE) 1000 MG tablet Take 1 tablet (1,000 mg total) by mouth 2 (two) times daily. 60 tablet 0   omeprazole (PRILOSEC) 20 MG capsule TAKE 1 CAPSULE(20 MG) BY MOUTH TWICE DAILY 60 capsule 2   ondansetron (ZOFRAN) 8 MG tablet Take 1 tablet (8 mg total) by mouth 2 (two) times daily as needed for refractory nausea / vomiting. 60 tablet 2   prochlorperazine (COMPAZINE) 10 MG tablet Take 1 tablet (10 mg total) by mouth every 6 (six) hours as needed (Nausea or vomiting). 60 tablet 2   TRELEGY ELLIPTA 200-62.5-25 MCG/INH AEPB Take 1 puff by mouth daily.     No current facility-administered medications for this visit.    OBJECTIVE: There were no vitals filed for this visit.     There is no height or weight on file to calculate BMI.    ECOG FS:1 - Symptomatic but completely ambulatory  General: Well-developed, well-nourished, no acute distress. Eyes: Pink conjunctiva, anicteric  sclera. HEENT: Normocephalic, moist mucous membranes. Lungs: No audible wheezing or coughing. Heart: Regular rate and rhythm. Abdomen: Soft, nontender, no obvious distention. Musculoskeletal: No edema, cyanosis, or clubbing. Neuro: Alert, answering all questions appropriately. Cranial nerves grossly intact. Skin: No rashes or petechiae noted. Psych: Normal affect.  LAB RESULTS:  Lab Results  Component Value Date   NA 130 (L) 12/24/2021   K 3.7 12/24/2021   CL 89 (L) 12/24/2021   CO2 28 12/24/2021   GLUCOSE 165 (H) 12/24/2021   BUN 20 12/24/2021   CREATININE 0.52 (L) 12/24/2021   CALCIUM 9.5 12/24/2021   PROT 7.4 12/24/2021   ALBUMIN 4.3 12/24/2021   AST 15 12/24/2021   ALT 15 12/24/2021   ALKPHOS 81 12/24/2021   BILITOT 0.7 12/24/2021   GFRNONAA >60 12/24/2021   GFRAA >60 11/12/2019    Lab Results  Component Value Date   WBC 10.8 (H) 12/24/2021  NEUTROABS 8.8 (H) 12/24/2021   HGB 15.3 12/24/2021   HCT 45.3 12/24/2021   MCV 87.6 12/24/2021   PLT 296 12/24/2021     STUDIES: No results found.  ONCOLOGY HISTORY: Patient initially underwent treatment with weekly carboplatinum and Taxol along with daily XRT.  He subsequently completed 1 year of maintenance durvalumab on August 27, 2018. CT scan on September 08, 2019 revealed progressive lymphadenopathy in his right supraclavicular and right thoracic inlet.  PET scan completed on September 27, 2019 confirmed likely recurrence.  Biopsy on October 04, 2019 consistent with squamous cell carcinoma. He completed XRT only for his local recurrence on November 17, 2019.  Repeat CT scan on August 28, 2020 with continued progression of 3 pulmonary nodules in his left and right upper lobe as well as right lower lobe.  PET scan results completed on September 18, 2020 revealed a right lower lobe nodule has a minimally elevated SUV of 2.9, the right upper lobe nodule has an SUV of 7.7 and additional 11 mm nodule in the left apex as an SUV of 5.3.  Patient has  now completed XRT to these lesions.   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  PLAN:    1.  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  PET scan results from July 05, 2021 reviewed independently with 4.6 cm left upper lobe mass substantially increased in size and hypermetabolism.  No additional sites of hypermetabolic disease are noted.  Given previous XRT to this area, patient agreed to systemic chemotherapy using Taxotere and Cyramza every 3 weeks.  Repeat PET scan on October 08, 2021 was essentially unchanged may be mildly progressive disease.   Patient was subsequently referred back to radiation oncology and has now completed 5 doses of SBRT to his residual lesion.  Patient has declined further chemotherapy at this time, therefore we will do a PET scan in approximately 5 weeks to assess if more treatment is necessary.  Return to clinic 2 to 3 days after imaging to discuss the results.  2.  Hypertension: Blood pressure moderately elevated today.  Continue evaluation and treatment per primary care. 3.  Cough: Chronic and unchanged.  Continue OTC medications as needed.  Patient was instructed to use his inhaler more frequently. 4.  Shoulder/neck pain: Continue fentanyl patch and hydrocodone as needed.  XRT as above.  Patient states he feels his pain is musculoskeletal in nature from his neck and plans to further discuss with primary care. 5.  Hyperglycemia:  Patient has improved blood glucose control.  Continue evaluation and treatment per primary care. 6.  Reflux: Resolved.  Continue omeprazole as prescribed.   7.  Nausea and vomiting: Resolved.  Patient has been instructed to take his antiemetics regularly after each chemotherapy cycle. 8.  Peripheral neuropathy:  Chronic and unchanged.  Taxotere has been dose reduced.  Hydrocodone as above.  Can consider gabapentin or Lyrica in the future. 9.  Hyponatremia: Chronic and unchanged.  Patient sodium is 128. 10.  Leukocytosis: Chronic and unchanged. 11.  Constipation: Patient does not complain of this today.  Continue MiraLAX as directed. 12.  Rash: Patient does not complain of this today.  Continue OTC treatments as needed.   13: Urinary complaints: Appears to be related to BPH.  Patient was previously  offered a referral to urology which he has declined.   Patient expressed understanding and was in agreement with this plan. He also understands that He can call clinic at any time with any questions, concerns, or complaints.    Cancer Staging  Squamous cell lung cancer, left [facility]) Staging form: Lung, AJCC 8th Edition - Clinical stage from 05/16/2017: Stage IIIB (cT3, cN2, cM0) - Signed by Lloyd Huger, MD on 05/16/2017   Lloyd Huger, MD   12/25/2021 7:32 AM

## 2021-12-27 ENCOUNTER — Encounter: Payer: Self-pay | Admitting: Oncology

## 2021-12-27 ENCOUNTER — Ambulatory Visit
Admission: RE | Admit: 2021-12-27 | Discharge: 2021-12-27 | Disposition: A | Discharge status: Home / Self Care | Payer: 59 | Source: Ambulatory Visit | Attending: Oncology | Admitting: Oncology

## 2021-12-27 ENCOUNTER — Inpatient Hospital Stay (HOSPITAL_BASED_OUTPATIENT_CLINIC_OR_DEPARTMENT_OTHER): Payer: 59 | Admitting: Oncology

## 2021-12-27 ENCOUNTER — Other Ambulatory Visit: Payer: Self-pay | Admitting: *Deleted

## 2021-12-27 VITALS — BP 157/67 | HR 100 | Temp 97.3°F | Resp 18 | Ht 68.0 in | Wt 189.5 lb

## 2021-12-27 DIAGNOSIS — C3412 Malignant neoplasm of upper lobe, left bronchus or lung: Secondary | ICD-10-CM | POA: Insufficient documentation

## 2021-12-27 DIAGNOSIS — Z923 Personal history of irradiation: Secondary | ICD-10-CM | POA: Insufficient documentation

## 2021-12-27 DIAGNOSIS — R739 Hyperglycemia, unspecified: Secondary | ICD-10-CM | POA: Diagnosis not present

## 2021-12-27 DIAGNOSIS — E871 Hypo-osmolality and hyponatremia: Secondary | ICD-10-CM | POA: Diagnosis not present

## 2021-12-27 DIAGNOSIS — C3492 Malignant neoplasm of unspecified part of left bronchus or lung: Secondary | ICD-10-CM

## 2021-12-27 MED ORDER — ALBUTEROL SULFATE (2.5 MG/3ML) 0.083% IN NEBU
2.5000 mg | INHALATION_SOLUTION | Freq: Four times a day (QID) | RESPIRATORY_TRACT | 0 refills | Status: DC | PRN
Start: 2021-12-27 — End: 2022-08-21

## 2021-12-27 MED ORDER — FENTANYL 25 MCG/HR TD PT72: 1.0000 | MEDICATED_PATCH | TRANSDERMAL | 0 refills | Status: DC

## 2021-12-27 NOTE — Progress Notes (Signed)
Radiation Oncology Follow up Note  Name: Gabriel Mendez   Date:   12/27/2021 MRN:  [id number] DOB: [birth year]    This 61 y.o. 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  REFERRING PROVIDER: Lynnell Jude, MD  HPI: Patient is a 61 year old male had multiple radiation treatments now at 1 month from SBRT treatment to his left upper lobe.  He is also previously had treatments with concurrent chemoradiation for stage IIIb squamous cell carcinoma the right lung seen today in routine follow-up he is doing well.  He specifically denies cough hemoptysis chest tightness or any change in his pulmonary status..  Patient has declined any further chemotherapy at this time.  COMPLICATIONS OF TREATMENT: none  FOLLOW UP COMPLIANCE: keeps appointments   PHYSICAL EXAM:  There were no vitals taken for this visit. Well-developed well-nourished patient in NAD. HEENT reveals PERLA, EOMI, discs not visualized.  Oral cavity is clear. No oral mucosal lesions are identified. Neck is clear without evidence of cervical or supraclavicular adenopathy. Lungs are clear to A&P. Cardiac examination is essentially unremarkable with regular rate and rhythm without murmur rub or thrill. Abdomen is benign with no organomegaly or masses noted. Motor sensory and DTR levels are equal and symmetric in the upper and lower extremities. Cranial nerves II through XII are grossly intact. Proprioception is intact. No peripheral adenopathy or edema is identified. No motor or sensory levels are noted. Crude visual fields are within normal range.  RADIOLOGY RESULTS: CT scan ordered for 3 months  PLAN: Present time patient is doing well pulmonary status is stable.  He continues follow-up care with medical oncology.  We will see him back in  3 months with a CT scan of his chest prior to that and make any further treatment decisions.  He continues close follow-up care with medical oncology for his multiple medical problems as well as hyponatremia leukocytosis hyperglycemia.  Patient is to call with any concerns.  I would like to take this opportunity to thank you for allowing me to participate in the care of your patient.[name], MD

## 2022-01-07 ENCOUNTER — Other Ambulatory Visit: Payer: Self-pay

## 2022-01-07 ENCOUNTER — Other Ambulatory Visit: Payer: Self-pay | Admitting: *Deleted

## 2022-01-07 DIAGNOSIS — C3492 Malignant neoplasm of unspecified part of left bronchus or lung: Secondary | ICD-10-CM

## 2022-01-07 MED ORDER — HYDROCODONE-ACETAMINOPHEN 5-325 MG PO TABS: 2.0000 | ORAL_TABLET | ORAL | 0 refills | Status: DC | PRN

## 2022-01-07 MED ORDER — OMEPRAZOLE 20 MG PO CPDR: DELAYED_RELEASE_CAPSULE | ORAL | 2 refills | Status: DC

## 2022-01-09 ENCOUNTER — Encounter: Payer: Self-pay | Admitting: Oncology

## 2022-01-18 ENCOUNTER — Telehealth: Payer: Self-pay | Admitting: *Deleted

## 2022-01-18 NOTE — Telephone Encounter (Signed)
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 He is on oxycodone and he has a paper that his work needs filled out in order for him to continue driving. He states that he does not have any side effects while taking the medication. Paper filled out and gave it to him and made a copy for records

## 2022-01-25 ENCOUNTER — Other Ambulatory Visit: Payer: Self-pay | Admitting: *Deleted

## 2022-01-25 DIAGNOSIS — C3492 Malignant neoplasm of unspecified part of left bronchus or lung: Secondary | ICD-10-CM

## 2022-01-25 MED ORDER — FENTANYL 25 MCG/HR TD PT72: 1.0000 | MEDICATED_PATCH | TRANSDERMAL | 0 refills | Status: DC

## 2022-01-25 MED ORDER — HYDROCODONE-ACETAMINOPHEN 5-325 MG PO TABS: 2.0000 | ORAL_TABLET | ORAL | 0 refills | Status: DC | PRN

## 2022-01-25 NOTE — Telephone Encounter (Signed)
Josh the msg to add pt for palliative care is attached to this refill request. FYI- Thanks. Nira Conn

## 2022-01-29 ENCOUNTER — Inpatient Hospital Stay: Payer: 59 | Attending: Oncology | Admitting: Hospice and Palliative Medicine

## 2022-01-29 DIAGNOSIS — M50322 Other cervical disc degeneration at C5-C6 level: Secondary | ICD-10-CM | POA: Insufficient documentation

## 2022-01-29 DIAGNOSIS — M50323 Other cervical disc degeneration at C6-C7 level: Secondary | ICD-10-CM | POA: Insufficient documentation

## 2022-01-29 DIAGNOSIS — M50321 Other cervical disc degeneration at C4-C5 level: Secondary | ICD-10-CM | POA: Insufficient documentation

## 2022-01-29 DIAGNOSIS — F17210 Nicotine dependence, cigarettes, uncomplicated: Secondary | ICD-10-CM | POA: Insufficient documentation

## 2022-01-29 DIAGNOSIS — C3492 Malignant neoplasm of unspecified part of left bronchus or lung: Secondary | ICD-10-CM | POA: Insufficient documentation

## 2022-01-29 DIAGNOSIS — E119 Type 2 diabetes mellitus without complications: Secondary | ICD-10-CM | POA: Insufficient documentation

## 2022-01-29 DIAGNOSIS — I10 Essential (primary) hypertension: Secondary | ICD-10-CM | POA: Insufficient documentation

## 2022-02-14 ENCOUNTER — Other Ambulatory Visit: Payer: Self-pay

## 2022-02-14 ENCOUNTER — Encounter: Payer: Self-pay | Admitting: Hospice and Palliative Medicine

## 2022-02-14 ENCOUNTER — Inpatient Hospital Stay (HOSPITAL_BASED_OUTPATIENT_CLINIC_OR_DEPARTMENT_OTHER): Payer: 59 | Admitting: Hospice and Palliative Medicine

## 2022-02-14 VITALS — BP 146/90 | HR 99 | Temp 98.9°F | Resp 18

## 2022-02-14 DIAGNOSIS — M50323 Other cervical disc degeneration at C6-C7 level: Secondary | ICD-10-CM | POA: Diagnosis not present

## 2022-02-14 DIAGNOSIS — M50321 Other cervical disc degeneration at C4-C5 level: Secondary | ICD-10-CM | POA: Diagnosis not present

## 2022-02-14 DIAGNOSIS — M50322 Other cervical disc degeneration at C5-C6 level: Secondary | ICD-10-CM | POA: Diagnosis not present

## 2022-02-14 DIAGNOSIS — I10 Essential (primary) hypertension: Secondary | ICD-10-CM | POA: Diagnosis not present

## 2022-02-14 DIAGNOSIS — C3490 Malignant neoplasm of unspecified part of unspecified bronchus or lung: Secondary | ICD-10-CM | POA: Diagnosis not present

## 2022-02-14 DIAGNOSIS — E119 Type 2 diabetes mellitus without complications: Secondary | ICD-10-CM | POA: Diagnosis not present

## 2022-02-14 DIAGNOSIS — C3492 Malignant neoplasm of unspecified part of left bronchus or lung: Secondary | ICD-10-CM

## 2022-02-14 DIAGNOSIS — F17210 Nicotine dependence, cigarettes, uncomplicated: Secondary | ICD-10-CM | POA: Diagnosis not present

## 2022-02-14 MED ORDER — HYDROCODONE-ACETAMINOPHEN 10-325 MG PO TABS: 1.0000 | ORAL_TABLET | Freq: Four times a day (QID) | ORAL | 0 refills | Status: DC | PRN

## 2022-02-14 MED ORDER — FENTANYL 25 MCG/HR TD PT72: 1.0000 | MEDICATED_PATCH | TRANSDERMAL | 0 refills | Status: DC

## 2022-02-14 NOTE — Progress Notes (Signed)
[place] [facility] Telephone:(336) [phone] Fax:(336) [phone]   Name: Gabriel Mendez Date: 02/14/2022 MRN: [id number]  DOB: 12/18/1960  Patient Care Team: Lynnell Jude, MD as PCP - General (Family Medicine) Lloyd Huger, MD as Consulting Physician (Oncology) [name], MD as Referring Physician (Radiation Oncology) Telford Nab, RN as Registered Nurse    REASON FOR CONSULTATION: Gabriel Mendez is a 61 y.o. 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  PET scan on September 27, 2019 confirmed recurrence.  Patient completed XRT in June 2021 for local recurrence and then CT scan in March 2022 revealed progression.  He is status post concurrent chemoradiation.  Palliative care was consulted to address pain.   SOCIAL HISTORY:     reports that he has been smoking cigarettes. He has been smoking an average of .5 packs per day. He has never used smokeless tobacco. He reports that he does not drink alcohol and does not use drugs.  Patient works as a Retail banker:    CODE STATUS:   PAST MEDICAL HISTORY: Past Medical History:  Diagnosis Date   Asthma    Cancer associated pain    COPD (chronic obstructive pulmonary disease) ([place])    Diabetes mellitus without complication ([place])    Hypertension    Lung cancer ([place]) 05/2017   Hx Chemo + rad tx's.   Pneumonia     PAST SURGICAL HISTORY:  Past Surgical History:  Procedure Laterality Date   ENDOBRONCHIAL ULTRASOUND N/A 05/12/2017   Procedure: ENDOBRONCHIAL ULTRASOUND;  Surgeon: Laverle Hobby, MD;  Location: [facility] ORS;  Service: Pulmonary;  Laterality: N/A;   FRACTURE SURGERY Left    ANKLE X 2   TONSILLECTOMY      HEMATOLOGY/ONCOLOGY HISTORY:  Oncology History  Squamous cell lung  cancer, left ([place])  05/16/2017 Initial Diagnosis   Squamous cell lung cancer, left ([place])   09/11/2017 - 08/27/2018 Chemotherapy   Patient is on Treatment Plan : LUNG DURVALUMAB C62B     07/17/2021 - 10/09/2021 Chemotherapy   Patient is on Treatment Plan : LUNG Docetaxel + Ramucirumab q21d        ALLERGIES:  is allergic to shrimp [shellfish allergy].  MEDICATIONS:  Current Outpatient Medications  Medication Sig Dispense Refill   albuterol (PROVENTIL) (2.5 MG/3ML) 0.083% nebulizer solution Take 3 mLs (2.5 mg total) by nebulization every 6 (six) hours as needed for wheezing or shortness of breath. 75 mL 0   albuterol (VENTOLIN HFA) 108 (90 Base) MCG/ACT inhaler Inhale 2 puffs into the lungs every 6 (six) hours as needed for wheezing or shortness of breath. 1 Inhaler 0   ALPRAZolam (XANAX) 0.5 MG tablet Take 1 tablet (0.5 mg total) by mouth as needed for anxiety (Before radiation treatments). (Patient not taking: Reported on 02/28/2021) 10 tablet 0   amLODipine (NORVASC) 10 MG tablet Take 10 mg by mouth daily.     amLODipine (NORVASC) 5 MG tablet Take 10 mg by mouth daily. (Patient not taking: Reported on 11/20/2021)     atorvastatin (LIPITOR) 20 MG tablet Take 20 mg by mouth daily.     baclofen (LIORESAL) 10 MG tablet TAKE 1 TABLET(10 MG) BY MOUTH THREE TIMES DAILY (Patient not taking: Reported on 11/20/2021) 90 tablet 1   fentaNYL (DURAGESIC) 25 MCG/HR Place 1 patch  onto the skin every 3 (three) days. 10 patch 0   glipiZIDE (GLUCOTROL) 5 MG tablet Take 10 mg by mouth daily before breakfast.      HYDROcodone-acetaminophen (NORCO) 5-325 MG tablet Take 2 tablets by mouth every 4 (four) hours as needed for moderate pain. 90 tablet 0   ibuprofen (ADVIL,MOTRIN) 200 MG tablet Take 400-800 mg by mouth every 8 (eight) hours as needed (for pain.).     lisinopril-hydrochlorothiazide (ZESTORETIC) 20-25 MG tablet Take 1 tablet by mouth daily. 30 tablet 0   metFORMIN (GLUCOPHAGE) 1000 MG tablet Take 1 tablet  (1,000 mg total) by mouth 2 (two) times daily. 60 tablet 0   omeprazole (PRILOSEC) 20 MG capsule TAKE 1 CAPSULE(20 MG) BY MOUTH TWICE DAILY 60 capsule 2   ondansetron (ZOFRAN) 8 MG tablet Take 1 tablet (8 mg total) by mouth 2 (two) times daily as needed for refractory nausea / vomiting. 60 tablet 2   prochlorperazine (COMPAZINE) 10 MG tablet Take 1 tablet (10 mg total) by mouth every 6 (six) hours as needed (Nausea or vomiting). 60 tablet 2   TRELEGY ELLIPTA 200-62.5-25 MCG/INH AEPB Take 1 puff by mouth daily.     No current facility-administered medications for this visit.    VITAL SIGNS: There were no vitals taken for this visit. There were no vitals filed for this visit.  Estimated body mass index is 28.81 kg/m as calculated from the following:   Height as of 12/27/21: 5\' 8"  (1.727 m).   Weight as of 12/27/21: 189 lb 8 oz (86 kg).  LABS: CBC:    Component Value Date/Time   WBC 10.8 (H) 12/24/2021 0902   HGB 15.3 12/24/2021 0902   HCT 45.3 12/24/2021 0902   PLT 296 12/24/2021 0902   MCV 87.6 12/24/2021 0902   NEUTROABS 8.8 (H) 12/24/2021 0902   LYMPHSABS 0.6 (L) 12/24/2021 0902   MONOABS 0.7 12/24/2021 0902   EOSABS 0.4 12/24/2021 0902   BASOSABS 0.2 (H) 12/24/2021 0902   Comprehensive Metabolic Panel:    Component Value Date/Time   NA 130 (L) 12/24/2021 0902   K 3.7 12/24/2021 0902   CL 89 (L) 12/24/2021 0902   CO2 28 12/24/2021 0902   BUN 20 12/24/2021 0902   CREATININE 0.52 (L) 12/24/2021 0902   GLUCOSE 165 (H) 12/24/2021 0902   CALCIUM 9.5 12/24/2021 0902   AST 15 12/24/2021 0902   ALT 15 12/24/2021 0902   ALKPHOS 81 12/24/2021 0902   BILITOT 0.7 12/24/2021 0902   PROT 7.4 12/24/2021 0902   ALBUMIN 4.3 12/24/2021 0902    RADIOGRAPHIC STUDIES: No results found.  PERFORMANCE STATUS (ECOG) : 1 - Symptomatic but completely ambulatory  Review of Systems Unless otherwise noted, a complete review of systems is negative.  Physical Exam General:  NAD Cardiovascular: regular rate and rhythm Pulmonary: clear anterior/posterior fields Abdomen: soft, nontender, + bowel sounds GU: no suprapubic tenderness Extremities: no edema, no joint deformities Skin: no rashes Neurological: Weakness but otherwise nonfocal  IMPRESSION: Patient seen today for evaluation of pain.  He endorses chronic neck pain radiating to right shoulder.  Movement of his neck either exacerbates or improves the pain.  Patient is using fentanyl patch, which he changes every 3 days.  He is taking Norco on average twice daily.  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  He is primarily using it at nighttime.  Patient  has been on transdermal fentanyl since at least February 2023.  He takes Norco 5-325mg  (two tablets) every 4 hours for breakthrough pain.  PDMP reviewed.  Has requested to evaluate due to frequent refill request.  However, it appears that patient is receiving regular refills of fentanyl monthly and then Norco about every 3 weeks.  Used at maximum allowable and dosing/frequency, supply would only last 8 days.  Refill frequency does seem appropriate.  Imaging reviewed.  X-ray of the cervical spine in October 2022 showed multilevel degenerative disc disease and moderate right-sided neuroforaminal stenosis at C4-C5, C5-C6 and C6-C7.  I suspect that his pain is related to his cervical spinal disease and less likely related to his history of lung cancer/treatment.  Patient is pending CT of the chest in October for cancer surveillance.  Will add on a CT of the cervical spine for evaluation of neck pain.  He was not interested in MRI and did not want to obtain imaging sooner.  We will rotate to Norco 10 mg tablets to reduce pill burden.  Continue fentanyl for now.  Patient is taking ibuprofen but does not find that to be particularly helpful.  Discussed rotating to  something like meloxicam but he was not interested.  He was potentially interested in interventional procedures but will await results of CT prior to considering referral to pain clinic, neurosurgery or orthopedics.  Ideally, patient states that he would be interested in trying to wean off pain medications in the future.  PLAN: -Continue current scope of treatment -PDMP reviewed -Refill transdermal fentanyl #10 -Rotate to Norco 10-325 mg Q6H PRN #90 -Avoid opioids while driving -Daily bowel regimen -Follow-up telephone visit 1 month  Case and plan discussed with Dr. Grayland Ormond  Patient expressed understanding and was in agreement with this plan. He also understands that He can call the clinic at any time with any questions, concerns, or complaints.     Time Total: 15 minutes  Visit consisted of counseling and education dealing with the complex and emotionally intense issues of symptom management and palliative care in the setting of serious and potentially life-threatening illness.Greater than 50%  of this time was spent counseling and coordinating care related to the above assessment and plan.  Signed by: Altha Harm, PhD, NP-C

## 2022-03-21 ENCOUNTER — Other Ambulatory Visit: Payer: 59

## 2022-03-21 ENCOUNTER — Inpatient Hospital Stay: Payer: 59

## 2022-03-21 DIAGNOSIS — C3492 Malignant neoplasm of unspecified part of left bronchus or lung: Secondary | ICD-10-CM | POA: Insufficient documentation

## 2022-03-21 DIAGNOSIS — I10 Essential (primary) hypertension: Secondary | ICD-10-CM | POA: Insufficient documentation

## 2022-03-21 DIAGNOSIS — E119 Type 2 diabetes mellitus without complications: Secondary | ICD-10-CM | POA: Insufficient documentation

## 2022-03-21 DIAGNOSIS — Z923 Personal history of irradiation: Secondary | ICD-10-CM | POA: Insufficient documentation

## 2022-03-21 DIAGNOSIS — G893 Neoplasm related pain (acute) (chronic): Secondary | ICD-10-CM | POA: Insufficient documentation

## 2022-03-21 DIAGNOSIS — Z9221 Personal history of antineoplastic chemotherapy: Secondary | ICD-10-CM | POA: Insufficient documentation

## 2022-03-21 DIAGNOSIS — Z79899 Other long term (current) drug therapy: Secondary | ICD-10-CM | POA: Insufficient documentation

## 2022-03-21 DIAGNOSIS — F17210 Nicotine dependence, cigarettes, uncomplicated: Secondary | ICD-10-CM | POA: Insufficient documentation

## 2022-03-21 DIAGNOSIS — J449 Chronic obstructive pulmonary disease, unspecified: Secondary | ICD-10-CM | POA: Insufficient documentation

## 2022-03-21 LAB — URINALYSIS, DIPSTICK ONLY
Bilirubin Urine: NEGATIVE
Glucose, UA: NEGATIVE mg/dL
Hgb urine dipstick: NEGATIVE
Ketones, ur: NEGATIVE mg/dL
Nitrite: NEGATIVE
Protein, ur: >=300 mg/dL — AB
Specific Gravity, Urine: 1.011 (ref 1.005–1.030)
pH: 7 (ref 5.0–8.0)

## 2022-03-21 LAB — CBC WITH DIFFERENTIAL/PLATELET
Abs Immature Granulocytes: 0.09 10*3/uL — ABNORMAL HIGH (ref 0.00–0.07)
Basophils Absolute: 0.1 10*3/uL (ref 0.0–0.1)
Basophils Relative: 1 %
Eosinophils Absolute: 0.5 10*3/uL (ref 0.0–0.5)
Eosinophils Relative: 4 %
HCT: 40.1 % (ref 39.0–52.0)
Hemoglobin: 13.8 g/dL (ref 13.0–17.0)
Immature Granulocytes: 1 %
Lymphocytes Relative: 4 %
Lymphs Abs: 0.5 10*3/uL — ABNORMAL LOW (ref 0.7–4.0)
MCH: 28.6 pg (ref 26.0–34.0)
MCHC: 34.4 g/dL (ref 30.0–36.0)
MCV: 83 fL (ref 80.0–100.0)
Monocytes Absolute: 0.9 10*3/uL (ref 0.1–1.0)
Monocytes Relative: 7 %
Neutro Abs: 11.4 10*3/uL — ABNORMAL HIGH (ref 1.7–7.7)
Neutrophils Relative %: 83 %
Platelets: 368 10*3/uL (ref 150–400)
RBC: 4.83 MIL/uL (ref 4.22–5.81)
RDW: 12.7 % (ref 11.5–15.5)
WBC: 13.5 10*3/uL — ABNORMAL HIGH (ref 4.0–10.5)
nRBC: 0 % (ref 0.0–0.2)

## 2022-03-21 LAB — COMPREHENSIVE METABOLIC PANEL
ALT: 19 U/L (ref 0–44)
AST: 18 U/L (ref 15–41)
Albumin: 3.8 g/dL (ref 3.5–5.0)
Alkaline Phosphatase: 104 U/L (ref 38–126)
Anion gap: 12 (ref 5–15)
BUN: 16 mg/dL (ref 8–23)
CO2: 30 mmol/L (ref 22–32)
Calcium: 9.2 mg/dL (ref 8.9–10.3)
Chloride: 84 mmol/L — ABNORMAL LOW (ref 98–111)
Creatinine, Ser: 0.65 mg/dL (ref 0.61–1.24)
GFR, Estimated: >60 mL/min (ref >60)
Glucose, Bld: 183 mg/dL — ABNORMAL HIGH (ref 70–99)
Potassium: 3.3 mmol/L — ABNORMAL LOW (ref 3.5–5.1)
Sodium: 126 mmol/L — ABNORMAL LOW (ref 135–145)
Total Bilirubin: 0.6 mg/dL (ref 0.3–1.2)
Total Protein: 7.2 g/dL (ref 6.5–8.1)

## 2022-03-21 LAB — TSH: TSH: 2.844 u[IU]/mL (ref 0.350–4.500)

## 2022-03-22 ENCOUNTER — Ambulatory Visit
Admission: RE | Admit: 2022-03-22 | Discharge: 2022-03-22 | Disposition: A | Discharge status: Home / Self Care | Payer: 59 | Source: Ambulatory Visit | Attending: Radiation Oncology | Admitting: Radiation Oncology

## 2022-03-22 ENCOUNTER — Ambulatory Visit
Admission: RE | Admit: 2022-03-22 | Discharge: 2022-03-22 | Disposition: A | Discharge status: Home / Self Care | Payer: 59 | Source: Ambulatory Visit | Attending: Hospice and Palliative Medicine | Admitting: Hospice and Palliative Medicine

## 2022-03-22 DIAGNOSIS — C3490 Malignant neoplasm of unspecified part of unspecified bronchus or lung: Secondary | ICD-10-CM

## 2022-03-22 DIAGNOSIS — C3412 Malignant neoplasm of upper lobe, left bronchus or lung: Secondary | ICD-10-CM | POA: Diagnosis present

## 2022-03-22 LAB — T4: T4, Total: 10.2 ug/dL (ref 4.5–12.0)

## 2022-03-22 MED ORDER — IOHEXOL 300 MG/ML  SOLN
75.0000 mL | Freq: Once | INTRAMUSCULAR | Status: AC | PRN
  Administered 2022-03-22: 75 mL via INTRAVENOUS

## 2022-03-24 NOTE — Progress Notes (Signed)
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  INTERVAL HISTORY: Patient returns to clinic today for further evaluation and discussion of his imaging results.  He continues to have significant cervical pain, but otherwise feels well.  He does not complain of peripheral neuropathy today.  He has no other neurologic complaints.  He has a good appetite and denies weight loss. He denies any chest pain, hemoptysis, or shortness of breath.  He continues to have chronic cough.  He denies any nausea, vomiting, constipation, or diarrhea.  He has no urinary complaints.  Patient offers no further specific complaints today.  REVIEW OF SYSTEMS:   Review of Systems  Constitutional: Negative.  Negative for fever, malaise/fatigue and weight loss.  HENT: Negative.  Negative for congestion.   Respiratory: Negative.  Negative for cough, hemoptysis and shortness of breath.   Cardiovascular: Negative.  Negative for chest pain and leg swelling.  Gastrointestinal:  Negative for abdominal pain, constipation, heartburn, nausea and vomiting.  Genitourinary: Negative.  Negative for dysuria.  Musculoskeletal:  Positive for neck pain. Negative for back pain and joint pain.  Skin: Negative.  Negative for itching and rash.  Neurological: Negative.  Negative for dizziness, sensory change, focal weakness, weakness and headaches.  Psychiatric/Behavioral: Negative.  The patient is not nervous/anxious.     As per HPI. Otherwise, a complete review of systems is negative.  PAST  MEDICAL HISTORY: Past Medical History:  Diagnosis Date   Asthma    Cancer associated pain    COPD (chronic obstructive pulmonary disease) ([place])    Diabetes mellitus without complication ([place])    Hypertension    Lung cancer ([place]) 05/2017   Hx Chemo + rad tx's.   Pneumonia     PAST SURGICAL HISTORY: Past Surgical History:  Procedure Laterality Date   ENDOBRONCHIAL ULTRASOUND N/A 05/12/2017   Procedure: ENDOBRONCHIAL ULTRASOUND;  Surgeon: Laverle Hobby, MD;  Location: [facility] ORS;  Service: Pulmonary;  Laterality: N/A;   FRACTURE SURGERY Left    ANKLE X 2   TONSILLECTOMY      FAMILY HISTORY: Family History  Problem Relation Age of Onset   Stroke Mother    Diabetes Mother    Hypertension Mother    Diabetes Father    Hypertension Father    Diabetes Sister    Diabetes Brother    Heart attack Paternal Uncle    Stroke Maternal Grandmother     ADVANCED DIRECTIVES (Y/N):  N  HEALTH MAINTENANCE: Social History   Tobacco Use   Smoking status: Some Days    Packs/day: 0.50    Types: Cigarettes   Smokeless tobacco: Never  Vaping Use   Vaping Use: Never used  Substance Use Topics   Alcohol use: No   Drug use: No     Colonoscopy:  PAP:  Bone density:  Lipid panel:  Allergies  Allergen Reactions   Shrimp [Shellfish Allergy] Nausea And Vomiting    Current Outpatient Medications  Medication Sig Dispense Refill   albuterol (PROVENTIL) (2.5 MG/3ML) 0.083% nebulizer solution Take 3 mLs (2.5 mg total) by nebulization every 6 (six) hours as needed for  wheezing or shortness of breath. 75 mL 0   albuterol (VENTOLIN HFA) 108 (90 Base) MCG/ACT inhaler Inhale 2 puffs into the lungs every 6 (six) hours as needed for wheezing or shortness of breath. 1 Inhaler 0   amLODipine (NORVASC) 10 MG tablet Take 10 mg by mouth daily.     atorvastatin (LIPITOR) 20 MG tablet Take 20 mg by mouth daily.     fentaNYL (DURAGESIC) 25 MCG/HR Place 1 patch onto the skin every 3 (three) days.  10 patch 0   glipiZIDE (GLUCOTROL) 5 MG tablet Take 10 mg by mouth daily before breakfast.      HYDROcodone-acetaminophen (NORCO) 10-325 MG tablet Take 1 tablet by mouth every 6 (six) hours as needed (breakthrough pain). 90 tablet 0   ibuprofen (ADVIL,MOTRIN) 200 MG tablet Take 400-800 mg by mouth every 8 (eight) hours as needed (for pain.).     lisinopril-hydrochlorothiazide (ZESTORETIC) 20-25 MG tablet Take 1 tablet by mouth daily. 30 tablet 0   metFORMIN (GLUCOPHAGE) 1000 MG tablet Take 1 tablet (1,000 mg total) by mouth 2 (two) times daily. 60 tablet 0   omeprazole (PRILOSEC) 20 MG capsule TAKE 1 CAPSULE(20 MG) BY MOUTH TWICE DAILY 60 capsule 2   ondansetron (ZOFRAN) 8 MG tablet Take 1 tablet (8 mg total) by mouth 2 (two) times daily as needed for refractory nausea / vomiting. 60 tablet 2   prochlorperazine (COMPAZINE) 10 MG tablet Take 1 tablet (10 mg total) by mouth every 6 (six) hours as needed (Nausea or vomiting). 60 tablet 2   TRELEGY ELLIPTA 200-62.5-25 MCG/INH AEPB Take 1 puff by mouth daily.     No current facility-administered medications for this visit.    OBJECTIVE: Vitals:   03/28/22 0953  BP: 131/69  Pulse: 96  Resp: 18  SpO2: 100%      Body mass index is 27.32 kg/m.    ECOG FS:1 - Symptomatic but completely ambulatory  General: Well-developed, well-nourished, no acute distress. Eyes: Pink conjunctiva, anicteric sclera. HEENT: Normocephalic, moist mucous membranes. Lungs: No audible wheezing or coughing. Heart: Regular rate and rhythm. Abdomen: Soft, nontender, no obvious distention. Musculoskeletal: No edema, cyanosis, or clubbing. Neuro: Alert, answering all questions appropriately. Cranial nerves grossly intact. Skin: No rashes or petechiae noted. Psych: Normal affect.  LAB RESULTS:  Lab Results  Component Value Date   NA 126 (L) 03/21/2022   K 3.3 (L) 03/21/2022   CL 84 (L) 03/21/2022   CO2 30 03/21/2022   GLUCOSE 183 (H) 03/21/2022   BUN 16  03/21/2022   CREATININE 0.65 03/21/2022   CALCIUM 9.2 03/21/2022   PROT 7.2 03/21/2022   ALBUMIN 3.8 03/21/2022   AST 18 03/21/2022   ALT 19 03/21/2022   ALKPHOS 104 03/21/2022   BILITOT 0.6 03/21/2022   GFRNONAA >60 03/21/2022   GFRAA >60 11/12/2019    Lab Results  Component Value Date   WBC 13.5 (H) 03/21/2022   NEUTROABS 11.4 (H) 03/21/2022   HGB 13.8 03/21/2022   HCT 40.1 03/21/2022   MCV 83.0 03/21/2022   PLT 368 03/21/2022     STUDIES: CT CERVICAL SPINE WO CONTRAST  Result Date: 03/23/2022 CLINICAL DATA:  Neck pain radiating to right shoulder EXAM: CT CERVICAL SPINE WITHOUT CONTRAST TECHNIQUE: Multidetector CT imaging of the cervical spine was performed without intravenous contrast. Multiplanar CT image reconstructions were also generated. RADIATION DOSE REDUCTION: This exam was performed according to the departmental dose-optimization program which includes automated exposure control, adjustment of the mA and/or kV according to  patient size and/or use of iterative reconstruction technique. COMPARISON:  Radiograph 04/09/2021 FINDINGS: Alignment: Straightening of the cervical spine. No subluxation. Facet alignment within normal limits Skull base and vertebrae: No acute fracture. No primary bone lesion or focal pathologic process. Soft tissues and spinal canal: No prevertebral fluid or swelling. No visible canal hematoma. Disc levels: At C2-C3, patent disc space. No canal stenosis. The foramen are patent bilaterally. At C3-C4, patent disc space, no canal stenosis. The foramen are patent bilaterally. At C4-C5, moderate severe disc space narrowing with vacuum disc and osteophyte. Small right paracentral disc protrusion. No canal stenosis. No high-grade foraminal narrowing. At C5-C6, advanced disc space narrowing. Suspect small central disc protrusion with indentation of thecal sac. No high-grade canal stenosis. Facet degenerative changes. Mild bilateral foraminal narrowing. At C6-C7,  advanced disc space narrowing. No high-grade canal stenosis. Mild bilateral foraminal narrowing. At C7-T1, patent disc space. No canal stenosis. The foramen are patent Upper chest: Emphysema Other: None IMPRESSION: 1. 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 2. Emphysema Electronically Signed   By: Donavan Foil M.D.   On: 03/23/2022 16:09   CT Chest W Contrast  Result Date: 03/23/2022 CLINICAL DATA:  61 year old male with history of non-small cell lung cancer, status post radiation therapy and chemotherapy. Evaluate for treatment response. Worsening shortness of breath. * Tracking Code: BO * EXAM: CT CHEST WITH CONTRAST TECHNIQUE: Multidetector CT imaging of the chest was performed during intravenous contrast administration. RADIATION DOSE REDUCTION: This exam was performed according to the departmental dose-optimization program which includes automated exposure control, adjustment of the mA and/or kV according to patient size and/or use of iterative reconstruction technique. CONTRAST:  69m OMNIPAQUE IOHEXOL 300 MG/ML  SOLN COMPARISON:  Multiple priors, most recently PET-CT 12/25/2021. Chest CT 06/01/2021. FINDINGS: Cardiovascular: Heart size is normal. There is no significant pericardial fluid, thickening or pericardial calcification. There is aortic atherosclerosis, as well as atherosclerosis of the great vessels of the mediastinum and the coronary arteries, including calcified atherosclerotic plaque in the left main, left anterior descending, left circumflex and right coronary arteries. Mediastinum/Nodes: Multiple prominent but nonenlarged mediastinal and bilateral hilar lymph nodes are noted, but nonspecific. Small diverticulum of the proximal aspect of the right side of the trachea incidentally noted. Esophagus is unremarkable in appearance. No axillary lymphadenopathy. Lungs/Pleura: Chronic volume loss and mass-like architectural distortion in  the central aspect of the right lung, similar to prior examinations, compatible with an area of chronic postradiation mass-like fibrosis. Previously noted cavitary left upper lobe neoplasm is similar to the recent PET-CT, demonstrating predominantly thin walls with minimal medial and lateral mural nodularity noted on axial images 48 and 49 of series 4 respectively. A slightly thick-walled cavity in the periphery of the right lower lobe (axial image 77 of series 4) measuring 1.1 x 1.0 cm is stable. No other new suspicious appearing pulmonary nodules or masses are noted. No acute consolidative airspace disease. Trace bilateral pleural effusion lying dependently. Mild paraseptal emphysema. Upper Abdomen: Low-attenuation adrenal nodules bilaterally, similar to prior studies, measuring 2.5 x 1.4 cm on the right and 2.2 x 2.0 cm on the left, previously categorized as adenomas. Aortic atherosclerosis. Capsular calcification associated with the lateral aspect of the spleen, likely sequela of remote trauma. Musculoskeletal: There are no aggressive appearing lytic or blastic lesions noted in the visualized portions of the skeleton. IMPRESSION: 1. Stable examination with treated lesions in the lungs bilaterally, which appear essentially unchanged compared to the recent  PET-CT. Continued attention on follow-up imaging is recommended to ensure continued stability/regression. No new lesions are identified. 2. Mild paraseptal emphysema. 3. Aortic atherosclerosis, in addition to left main and three-vessel coronary artery disease. Please note that although the presence of coronary artery calcium documents the presence of coronary artery disease, the severity of this disease and any potential stenosis cannot be assessed on this non-gated CT examination. Assessment for potential risk factor modification, dietary therapy or pharmacologic therapy may be warranted, if clinically indicated. 4. Bilateral adrenal adenomas again noted.  Aortic Atherosclerosis (ICD10-I70.0) and Emphysema (ICD10-J43.9). Electronically Signed   By: Vinnie Langton M.D.   On: 03/23/2022 10:12    ONCOLOGY HISTORY: Patient initially underwent treatment with weekly carboplatinum and Taxol along with daily XRT.  He subsequently completed 1 year of maintenance durvalumab on August 27, 2018. CT scan on September 08, 2019 revealed progressive lymphadenopathy in his right supraclavicular and right thoracic inlet.  PET scan completed on September 27, 2019 confirmed likely recurrence.  Biopsy on October 04, 2019 consistent with squamous cell carcinoma. He completed XRT only for his local recurrence on November 17, 2019.  Repeat CT scan on August 28, 2020 with continued progression of 3 pulmonary nodules in his left and right upper lobe as well as right lower lobe.  PET scan results completed on September 18, 2020 revealed a right lower lobe nodule has a minimally elevated SUV of 2.9, the right upper lobe nodule has an SUV of 7.7 and additional 11 mm nodule in the left apex as an SUV of 5.3.  Patient has now completed XRT to these lesions.  Repeat CT scan on June 01, 2021 reviewed independently with new progression of disease in his left lung.  PET scan results from July 05, 2021 reviewed independently with 4.6 cm left upper lobe mass substantially increased in size and hypermetabolism.  No additional sites of hypermetabolic disease are noted.  Given previous XRT to this area, patient agreed to systemic chemotherapy using Taxotere and Cyramza every 3 weeks.  Repeat PET scan on October 08, 2021 was essentially unchanged may be mildly progressive disease.   Patient was subsequently referred back to radiation oncology and has now completed 5 doses of SBRT to his residual lesion.  PET scan results from December 25, 2021 reviewed independently with significant improvement in both size and hypermetabolism of patient's lung lesion.   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  PLAN:    1.  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  Patient last had chemotherapy with Taxotere and Cyramza on October 09, 2021.  He completed another round of XRT soon after.  His most recent CT scan on March 22, 2022 reviewed independently and reported as above with no obvious evidence of recurrent or progressive disease.  No intervention is needed.  Return to clinic in 3 months with repeat imaging and further evaluation at which point patient can likely be transition to imaging and duration every 6 months.    2.  Hypertension: Blood pressure is well controlled today.  Continue evaluation and treatment per primary care. 3.  Cough/shortness of breath: Patient does not complain of this today.  Likely secondary to underlying COPD.    4.  Shoulder/neck pain: Cervical CT scan results reviewed independently and reported as above confirming symptoms are unrelated to malignancy.  Patient was given referral to  neurosurgery as well as pain clinic.  He was given refills to his narcotics recently, but also instructed that since this is not related to his malignancy the cancer center cannot provide this long-term.   5.  Hyperglycemia:  Patient has improved blood glucose control.  Continue evaluation and treatment per primary care. 6.  Peripheral neuropathy: Patient does not complain of this today. 7.  Hyponatremia: Chronic and unchanged.  Patient's most recent sodium was 126.   8.  Leukocytosis: Chronic and unchanged.  Likely reactive.    Patient expressed understanding and was in agreement with this plan. He also understands that He can call clinic at any time with any questions, concerns, or complaints.    Cancer Staging  Squamous cell lung cancer, left [facility]) Staging form: Lung, AJCC 8th Edition - Clinical stage from 05/16/2017: Stage IIIB (cT3, cN2, cM0) - Signed by Lloyd Huger, MD on 05/16/2017   Lloyd Huger, MD   03/28/2022 11:56 AM

## 2022-03-26 ENCOUNTER — Other Ambulatory Visit: Payer: Self-pay

## 2022-03-26 DIAGNOSIS — C3492 Malignant neoplasm of unspecified part of left bronchus or lung: Secondary | ICD-10-CM

## 2022-03-26 MED ORDER — HYDROCODONE-ACETAMINOPHEN 10-325 MG PO TABS: 1.0000 | ORAL_TABLET | Freq: Four times a day (QID) | ORAL | 0 refills | Status: DC | PRN

## 2022-03-26 MED ORDER — FENTANYL 25 MCG/HR TD PT72: 1.0000 | MEDICATED_PATCH | TRANSDERMAL | 0 refills | Status: DC

## 2022-03-26 NOTE — Telephone Encounter (Signed)
Patient requesting refill on the pended pain medications. Please advise

## 2022-03-27 ENCOUNTER — Telehealth: Payer: 59 | Admitting: Hospice and Palliative Medicine

## 2022-03-28 ENCOUNTER — Telehealth: Payer: Self-pay | Admitting: *Deleted

## 2022-03-28 ENCOUNTER — Other Ambulatory Visit: Payer: Self-pay | Admitting: *Deleted

## 2022-03-28 ENCOUNTER — Inpatient Hospital Stay (HOSPITAL_BASED_OUTPATIENT_CLINIC_OR_DEPARTMENT_OTHER): Payer: 59 | Admitting: Oncology

## 2022-03-28 ENCOUNTER — Encounter: Payer: Self-pay | Admitting: Oncology

## 2022-03-28 ENCOUNTER — Inpatient Hospital Stay (HOSPITAL_BASED_OUTPATIENT_CLINIC_OR_DEPARTMENT_OTHER): Payer: 59 | Admitting: Hospice and Palliative Medicine

## 2022-03-28 ENCOUNTER — Ambulatory Visit
Admission: RE | Admit: 2022-03-28 | Discharge: 2022-03-28 | Disposition: A | Discharge status: Home / Self Care | Payer: 59 | Source: Ambulatory Visit | Attending: Oncology | Admitting: Oncology

## 2022-03-28 VITALS — BP 131/69 | HR 96 | Resp 18 | Wt 179.7 lb

## 2022-03-28 DIAGNOSIS — M79601 Pain in right arm: Secondary | ICD-10-CM

## 2022-03-28 DIAGNOSIS — M25519 Pain in unspecified shoulder: Secondary | ICD-10-CM

## 2022-03-28 DIAGNOSIS — C3492 Malignant neoplasm of unspecified part of left bronchus or lung: Secondary | ICD-10-CM | POA: Diagnosis not present

## 2022-03-28 NOTE — Progress Notes (Signed)
Radiation Oncology Follow up Note  Name: Gabriel Mendez   Date:   03/28/2022 MRN:  [id number] DOB: [birth year]    This 61 y.o. 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  REFERRING PROVIDER: Lynnell Jude, MD  HPI: Patient is a 61 year old male now 4 months having recently completed SBRT to his left upper lobe for a get a stage I non-small cell lung cancer.  Patient's had multiple SBRT treatments in the past seen today in routine follow-up he is doing well.  He specifically denies cough hemoptysis chest tightness or any real change in his pulmonary status..  He had a recent CT scan showing stable examination with treated lesions in the lungs bilaterally appear essentially unchanged compared to prior PET CT scans.  I have reviewed his serial scans and most recent CT scan.  COMPLICATIONS OF TREATMENT: none  FOLLOW UP COMPLIANCE: keeps appointments   PHYSICAL EXAM:  There were no vitals taken for this visit. Well-developed well-nourished patient in NAD. HEENT reveals PERLA, EOMI, discs not visualized.  Oral cavity is clear. No oral mucosal lesions are identified. Neck is clear without evidence of cervical or supraclavicular adenopathy. Lungs are clear to A&P. Cardiac examination is essentially unremarkable with regular rate and rhythm without murmur rub or thrill. Abdomen is benign with no organomegaly or masses noted. Motor sensory and DTR levels are equal and symmetric in the upper and lower extremities. Cranial nerves II through XII are grossly intact. Proprioception is intact. No peripheral adenopathy or edema is identified. No motor or sensory levels are noted. Crude visual fields are within normal range.  RADIOLOGY RESULTS: CT scans reviewed compatible with above-stated  findings  PLAN: Present time patient is doing well excellent response to treatment.  I have asked to see him back in 6 months for follow-up with a repeat CT scan at that time.  He already has a follow-up CT scan scheduled in 3 months by Dr. Grayland Ormond we will review that when it becomes available.  Patient knows to call with any concerns.  I would like to take this opportunity to thank you for allowing me to participate in the care of your patient.[name], MD

## 2022-03-28 NOTE — Progress Notes (Signed)
[place] at [facility] Telephone:([phone]) [phone] Fax:(336) [phone]   Name: Gabriel Mendez Date: 03/28/2022 MRN: [id number]  DOB: Jan 22, 1961  Patient Care Team: Lynnell Jude, MD as PCP - General (Family Medicine) Lloyd Huger, MD as Consulting Physician (Oncology) [name], MD as Referring Physician (Radiation Oncology) Telford Nab, RN as Registered Nurse    REASON FOR CONSULTATION: Gabriel Mendez is a 61 y.o. 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  PET scan on September 27, 2019 confirmed recurrence.  Patient completed XRT in June 2021 for local recurrence and then CT scan in March 2022 revealed progression.  He is status post concurrent chemoradiation.  Palliative care was consulted to address pain.   SOCIAL HISTORY:     reports that he has been smoking cigarettes. He has been smoking an average of .5 packs per day. He has never used smokeless tobacco. He reports that he does not drink alcohol and does not use drugs.  Patient works as a Retail banker:    CODE STATUS:   PAST MEDICAL HISTORY: Past Medical History:  Diagnosis Date   Asthma    Cancer associated pain    COPD (chronic obstructive pulmonary disease) ([place])    Diabetes mellitus without complication ([place])    Hypertension    Lung cancer ([place]) 05/2017   Hx Chemo + rad tx's.   Pneumonia     PAST SURGICAL HISTORY:  Past Surgical History:  Procedure Laterality Date   ENDOBRONCHIAL ULTRASOUND N/A 05/12/2017   Procedure: ENDOBRONCHIAL ULTRASOUND;  Surgeon: Laverle Hobby, MD;  Location: [facility] ORS;  Service: Pulmonary;  Laterality: N/A;   FRACTURE SURGERY Left    ANKLE X 2   TONSILLECTOMY      HEMATOLOGY/ONCOLOGY HISTORY:  Oncology History  Squamous cell  lung cancer, left ([place])  05/16/2017 Initial Diagnosis   Squamous cell lung cancer, left ([place])   09/11/2017 - 08/27/2018 Chemotherapy   Patient is on Treatment Plan : LUNG DURVALUMAB S28B     07/17/2021 - 10/09/2021 Chemotherapy   Patient is on Treatment Plan : LUNG Docetaxel + Ramucirumab q21d        ALLERGIES:  is allergic to shrimp [shellfish allergy].  MEDICATIONS:  Current Outpatient Medications  Medication Sig Dispense Refill   albuterol (PROVENTIL) (2.5 MG/3ML) 0.083% nebulizer solution Take 3 mLs (2.5 mg total) by nebulization every 6 (six) hours as needed for wheezing or shortness of breath. 75 mL 0   albuterol (VENTOLIN HFA) 108 (90 Base) MCG/ACT inhaler Inhale 2 puffs into the lungs every 6 (six) hours as needed for wheezing or shortness of breath. 1 Inhaler 0   amLODipine (NORVASC) 10 MG tablet Take 10 mg by mouth daily.     atorvastatin (LIPITOR) 20 MG tablet Take 20 mg by mouth daily.     fentaNYL (DURAGESIC) 25 MCG/HR Place 1 patch onto the skin every 3 (three) days. 10 patch 0   glipiZIDE (GLUCOTROL) 5 MG tablet Take 10 mg by mouth daily before breakfast.      HYDROcodone-acetaminophen (NORCO) 10-325 MG tablet Take 1 tablet by mouth every 6 (six) hours as needed (breakthrough pain). 90 tablet 0   ibuprofen (ADVIL,MOTRIN) 200 MG tablet Take 400-800 mg by mouth every 8 (eight) hours as needed (for pain.).     lisinopril-hydrochlorothiazide (ZESTORETIC)  20-25 MG tablet Take 1 tablet by mouth daily. 30 tablet 0   metFORMIN (GLUCOPHAGE) 1000 MG tablet Take 1 tablet (1,000 mg total) by mouth 2 (two) times daily. 60 tablet 0   omeprazole (PRILOSEC) 20 MG capsule TAKE 1 CAPSULE(20 MG) BY MOUTH TWICE DAILY 60 capsule 2   ondansetron (ZOFRAN) 8 MG tablet Take 1 tablet (8 mg total) by mouth 2 (two) times daily as needed for refractory nausea / vomiting. 60 tablet 2   prochlorperazine (COMPAZINE) 10 MG tablet Take 1 tablet (10 mg total) by mouth every 6 (six) hours as needed (Nausea or  vomiting). 60 tablet 2   TRELEGY ELLIPTA 200-62.5-25 MCG/INH AEPB Take 1 puff by mouth daily.     No current facility-administered medications for this visit.    VITAL SIGNS: There were no vitals taken for this visit. There were no vitals filed for this visit.  Estimated body mass index is 27.32 kg/m as calculated from the following:   Height as of 12/27/21: 5\' 8"  (1.727 m).   Weight as of an earlier encounter on 03/28/22: 179 lb 11.2 oz (81.5 kg).  LABS: CBC:    Component Value Date/Time   WBC 13.5 (H) 03/21/2022 0906   HGB 13.8 03/21/2022 0906   HCT 40.1 03/21/2022 0906   PLT 368 03/21/2022 0906   MCV 83.0 03/21/2022 0906   NEUTROABS 11.4 (H) 03/21/2022 0906   LYMPHSABS 0.5 (L) 03/21/2022 0906   MONOABS 0.9 03/21/2022 0906   EOSABS 0.5 03/21/2022 0906   BASOSABS 0.1 03/21/2022 0906   Comprehensive Metabolic Panel:    Component Value Date/Time   NA 126 (L) 03/21/2022 0906   K 3.3 (L) 03/21/2022 0906   CL 84 (L) 03/21/2022 0906   CO2 30 03/21/2022 0906   BUN 16 03/21/2022 0906   CREATININE 0.65 03/21/2022 0906   GLUCOSE 183 (H) 03/21/2022 0906   CALCIUM 9.2 03/21/2022 0906   AST 18 03/21/2022 0906   ALT 19 03/21/2022 0906   ALKPHOS 104 03/21/2022 0906   BILITOT 0.6 03/21/2022 0906   PROT 7.2 03/21/2022 0906   ALBUMIN 3.8 03/21/2022 0906    RADIOGRAPHIC STUDIES: CT CERVICAL SPINE WO CONTRAST  Result Date: 03/23/2022 CLINICAL DATA:  Neck pain radiating to right shoulder EXAM: CT CERVICAL SPINE WITHOUT CONTRAST TECHNIQUE: Multidetector CT imaging of the cervical spine was performed without intravenous contrast. Multiplanar CT image reconstructions were also generated. RADIATION DOSE REDUCTION: This exam was performed according to the departmental dose-optimization program which includes automated exposure control, adjustment of the mA and/or kV according to patient size and/or use of iterative reconstruction technique. COMPARISON:  Radiograph 04/09/2021 FINDINGS:  Alignment: Straightening of the cervical spine. No subluxation. Facet alignment within normal limits Skull base and vertebrae: No acute fracture. No primary bone lesion or focal pathologic process. Soft tissues and spinal canal: No prevertebral fluid or swelling. No visible canal hematoma. Disc levels: At C2-C3, patent disc space. No canal stenosis. The foramen are patent bilaterally. At C3-C4, patent disc space, no canal stenosis. The foramen are patent bilaterally. At C4-C5, moderate severe disc space narrowing with vacuum disc and osteophyte. Small right paracentral disc protrusion. No canal stenosis. No high-grade foraminal narrowing. At C5-C6, advanced disc space narrowing. Suspect small central disc protrusion with indentation of thecal sac. No high-grade canal stenosis. Facet degenerative changes. Mild bilateral foraminal narrowing. At C6-C7, advanced disc space narrowing. No high-grade canal stenosis. Mild bilateral foraminal narrowing. At C7-T1, patent disc space. No canal stenosis. The foramen are patent  Upper chest: Emphysema Other: None IMPRESSION: 1. 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 2. Emphysema Electronically Signed   By: Donavan Foil M.D.   On: 03/23/2022 16:09   CT Chest W Contrast  Result Date: 03/23/2022 CLINICAL DATA:  61 year old male with history of non-small cell lung cancer, status post radiation therapy and chemotherapy. Evaluate for treatment response. Worsening shortness of breath. * Tracking Code: BO * EXAM: CT CHEST WITH CONTRAST TECHNIQUE: Multidetector CT imaging of the chest was performed during intravenous contrast administration. RADIATION DOSE REDUCTION: This exam was performed according to the departmental dose-optimization program which includes automated exposure control, adjustment of the mA and/or kV according to patient size and/or use of iterative reconstruction technique. CONTRAST:  30mL OMNIPAQUE  IOHEXOL 300 MG/ML  SOLN COMPARISON:  Multiple priors, most recently PET-CT 12/25/2021. Chest CT 06/01/2021. FINDINGS: Cardiovascular: Heart size is normal. There is no significant pericardial fluid, thickening or pericardial calcification. There is aortic atherosclerosis, as well as atherosclerosis of the great vessels of the mediastinum and the coronary arteries, including calcified atherosclerotic plaque in the left main, left anterior descending, left circumflex and right coronary arteries. Mediastinum/Nodes: Multiple prominent but nonenlarged mediastinal and bilateral hilar lymph nodes are noted, but nonspecific. Small diverticulum of the proximal aspect of the right side of the trachea incidentally noted. Esophagus is unremarkable in appearance. No axillary lymphadenopathy. Lungs/Pleura: Chronic volume loss and mass-like architectural distortion in the central aspect of the right lung, similar to prior examinations, compatible with an area of chronic postradiation mass-like fibrosis. Previously noted cavitary left upper lobe neoplasm is similar to the recent PET-CT, demonstrating predominantly thin walls with minimal medial and lateral mural nodularity noted on axial images 48 and 49 of series 4 respectively. A slightly thick-walled cavity in the periphery of the right lower lobe (axial image 77 of series 4) measuring 1.1 x 1.0 cm is stable. No other new suspicious appearing pulmonary nodules or masses are noted. No acute consolidative airspace disease. Trace bilateral pleural effusion lying dependently. Mild paraseptal emphysema. Upper Abdomen: Low-attenuation adrenal nodules bilaterally, similar to prior studies, measuring 2.5 x 1.4 cm on the right and 2.2 x 2.0 cm on the left, previously categorized as adenomas. Aortic atherosclerosis. Capsular calcification associated with the lateral aspect of the spleen, likely sequela of remote trauma. Musculoskeletal: There are no aggressive appearing lytic or blastic  lesions noted in the visualized portions of the skeleton. IMPRESSION: 1. Stable examination with treated lesions in the lungs bilaterally, which appear essentially unchanged compared to the recent PET-CT. Continued attention on follow-up imaging is recommended to ensure continued stability/regression. No new lesions are identified. 2. Mild paraseptal emphysema. 3. Aortic atherosclerosis, in addition to left main and three-vessel coronary artery disease. Please note that although the presence of coronary artery calcium documents the presence of coronary artery disease, the severity of this disease and any potential stenosis cannot be assessed on this non-gated CT examination. Assessment for potential risk factor modification, dietary therapy or pharmacologic therapy may be warranted, if clinically indicated. 4. Bilateral adrenal adenomas again noted. Aortic Atherosclerosis (ICD10-I70.0) and Emphysema (ICD10-J43.9). Electronically Signed   By: Vinnie Langton M.D.   On: 03/23/2022 10:12    PERFORMANCE STATUS (ECOG) : 1 - Symptomatic but completely ambulatory  Review of Systems Unless otherwise noted, a complete review of systems is negative.  Physical Exam General: NAD Cardiovascular: regular rate and rhythm Pulmonary: clear anterior/posterior fields Abdomen: soft, nontender, + bowel sounds GU: no  suprapubic tenderness Extremities: no edema, no joint deformities Skin: no rashes Neurological: Weakness but otherwise nonfocal  IMPRESSION: Patient seen for routine follow-up.  CT cervical spine showed multilevel level degenerative changes, worst at C5-C6 and C6-C7.  Stable disease on CT of the chest.  Patient reports that pain has been worsening in the neck with radiculopathy down the arms.  This does not appear neoplasm related.  Will send referral to neurosurgery and pain management.  Patient verbalized understanding that we cannot chronically manage 9 neoplasm related pain.  PLAN: -Continue  current scope of treatment -PDMP reviewed -Continue fentanyl/Norco until patient seen by pain management -Daily bowel regimen --Referrals to pain management and neurosurgery -Follow-up telephone visit 1 month  Case and plan discussed with Dr. Grayland Ormond  Patient expressed understanding and was in agreement with this plan. He also understands that He can call the clinic at any time with any questions, concerns, or complaints.     Time Total: 15 minutes  Visit consisted of counseling and education dealing with the complex and emotionally intense issues of symptom management and palliative care in the setting of serious and potentially life-threatening illness.Greater than 50%  of this time was spent counseling and coordinating care related to the above assessment and plan.  Signed by: Altha Harm, PhD, NP-C

## 2022-03-28 NOTE — Telephone Encounter (Signed)
Per v/o [facility] Referral faxed to [facility] for pain mgt- shoulder/neck pain.

## 2022-03-29 ENCOUNTER — Ambulatory Visit: Payer: 59 | Admitting: Radiation Oncology

## 2022-04-09 NOTE — Addendum Note (Signed)
Addended by: Delice Bison E on: 04/09/2022 04:06 PM   Modules accepted: Orders

## 2022-04-10 ENCOUNTER — Telehealth: Payer: Self-pay

## 2022-04-10 NOTE — Telephone Encounter (Signed)
Referral has been faxed to neurosurgery.

## 2022-04-11 ENCOUNTER — Telehealth: Payer: Self-pay | Admitting: Oncology

## 2022-04-11 NOTE — Telephone Encounter (Signed)
Pt called and stated that he has not heard from Dr. Nelly Laurence office yet. Just fyi.

## 2022-04-11 NOTE — Telephone Encounter (Signed)
Called neurosurgeon office to check on referral. They stated they received it and will call patient in the next day or so. I informed patient of the response and advise he call back if he does not here anything in the next few days. Patient expressed understanding.

## 2022-04-18 ENCOUNTER — Other Ambulatory Visit: Payer: Self-pay

## 2022-04-18 ENCOUNTER — Ambulatory Visit (INDEPENDENT_AMBULATORY_CARE_PROVIDER_SITE_OTHER): Payer: 59

## 2022-04-18 ENCOUNTER — Ambulatory Visit
Admission: EM | Admit: 2022-04-18 | Discharge: 2022-04-18 | Disposition: A | Discharge status: Home / Self Care | Payer: 59 | Attending: Physician Assistant | Admitting: Physician Assistant

## 2022-04-18 DIAGNOSIS — J441 Chronic obstructive pulmonary disease with (acute) exacerbation: Secondary | ICD-10-CM

## 2022-04-18 DIAGNOSIS — R059 Cough, unspecified: Secondary | ICD-10-CM | POA: Diagnosis not present

## 2022-04-18 DIAGNOSIS — R058 Other specified cough: Secondary | ICD-10-CM | POA: Diagnosis present

## 2022-04-18 DIAGNOSIS — Z1152 Encounter for screening for COVID-19: Secondary | ICD-10-CM | POA: Diagnosis not present

## 2022-04-18 DIAGNOSIS — R0602 Shortness of breath: Secondary | ICD-10-CM

## 2022-04-18 LAB — RESP PANEL BY RT-PCR (FLU A&B, COVID) ARPGX2
Influenza A by PCR: NEGATIVE
Influenza B by PCR: NEGATIVE
SARS Coronavirus 2 by RT PCR: NEGATIVE

## 2022-04-18 MED ORDER — CEFTRIAXONE SODIUM 1 G IJ SOLR
1.0000 g | Freq: Once | INTRAMUSCULAR | Status: AC
  Administered 2022-04-18: 1 g via INTRAMUSCULAR

## 2022-04-18 MED ORDER — METHYLPREDNISOLONE SODIUM SUCC 125 MG IJ SOLR
125.0000 mg | Freq: Once | INTRAMUSCULAR | Status: AC
  Administered 2022-04-18: 125 mg via INTRAMUSCULAR

## 2022-04-18 MED ORDER — IPRATROPIUM-ALBUTEROL 0.5-2.5 (3) MG/3ML IN SOLN: 3.0000 mL | Freq: Four times a day (QID) | RESPIRATORY_TRACT | 0 refills | Status: DC | PRN

## 2022-04-18 MED ORDER — PREDNISONE 50 MG PO TABS: 50.0000 mg | ORAL_TABLET | Freq: Every day | ORAL | 0 refills | Status: AC

## 2022-04-18 MED ORDER — DOXYCYCLINE HYCLATE 100 MG PO CAPS
100.0000 mg | ORAL_CAPSULE | Freq: Two times a day (BID) | ORAL | 0 refills | Status: AC
Start: 2022-04-18 — End: 2022-04-25

## 2022-04-18 MED ORDER — IPRATROPIUM-ALBUTEROL 0.5-2.5 (3) MG/3ML IN SOLN
3.0000 mL | Freq: Once | RESPIRATORY_TRACT | Status: AC
  Administered 2022-04-18: 3 mL via RESPIRATORY_TRACT

## 2022-04-18 NOTE — Discharge Instructions (Addendum)
-  You are negative for COVID and flu.  Your chest x-ray does not show any evidence of pneumonia. - You are having an exacerbation of your COPD.  We have given you a couple breathing treatments in the clinic and I have sent the DuoNeb solution to the pharmacy for you to use in your nebulizer.  I also sent steroids and antibiotics.  Start oral prednisone tomorrow since you have been given an injection of a steroid tonight.  You may start the doxycycline tonight or wait until tomorrow.  We have given you an injection of antibiotic tonight.  Plenty of rest and fluids. - Return as needed but if you are having increased shortness of breath, fever or are feeling worse she should go to the ER.

## 2022-04-18 NOTE — ED Provider Notes (Addendum)
[facility]    CSN: [id number] Arrival date & time: 04/18/22  1747      History   Chief Complaint Chief Complaint  Patient presents with   Shortness of Breath    HPI Gabriel Mendez is a 61 y.o. male with history of asthma, COPD, recurrent lung cancer, hypertension and diabetes.  He reports that he finished chemo and radiation in June of this year for the lung cancer.  Patient presents today for feeling fatigued and short of breath since yesterday.  Also reports a productive cough of yellowish sputum for the past 2 weeks.  He says his throat is sore but not really painful when he swallows.  He denies any fevers.  He has had some pain in his right ear.  No sinus pain or nasal congestion.  Not reporting any chest pain, abdominal pain, nausea/vomiting or diarrhea.  No sick contacts.  States he lives alone.  Patient does use Trelegy and has albuterol for his nebulizer at home but has not been using albuterol for his breathing difficulty.  No other complaints.  HPI  Past Medical History:  Diagnosis Date   Asthma    Cancer associated pain    COPD (chronic obstructive pulmonary disease) ([place])    Diabetes mellitus without complication ([place])    Hypertension    Lung cancer ([place]) 05/2017   Hx Chemo + rad tx's.   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 Authorizing Provider  doxycycline (VIBRAMYCIN) 100 MG capsule Take 1 capsule (100 mg total) by mouth 2 (two)  times daily for 7 days. 04/18/22 04/25/22 Yes Danton Clap, PA-C  ipratropium-albuterol (DUONEB) 0.5-2.5 (3) MG/3ML SOLN Take 3 mLs by nebulization every 6 (six) hours as needed. 04/18/22  Yes Danton Clap, PA-C  OXYGEN Place 2 L into the nose at bedtime as needed (Uses nightly O2).   Yes [provider]  predniSONE (DELTASONE) 50 MG tablet Take 1 tablet (50 mg total) by mouth daily for 5 days. 04/18/22 04/23/22 Yes Laurene Footman B, PA-C  albuterol (PROVENTIL) (2.5 MG/3ML) 0.083% nebulizer solution Take 3 mLs (2.5 mg total) by nebulization every 6 (six) hours as needed for wheezing or shortness of breath. 12/27/21   Lloyd Huger, MD  albuterol (VENTOLIN HFA) 108 (90 Base) MCG/ACT inhaler Inhale 2 puffs into the lungs every 6 (six) hours as needed for wheezing or shortness of breath. 11/21/18   Karen Kitchens, NP  amLODipine (NORVASC) 10 MG tablet Take 10 mg by mouth daily. 11/13/21   [provider]  atorvastatin (LIPITOR) 20 MG tablet Take 20 mg by mouth daily. 02/09/19   [provider]  fentaNYL (DURAGESIC) 25 MCG/HR Place 1 patch onto the skin every 3 (three) days. 03/26/22   Borders, Kirt Boys, NP  glipiZIDE (GLUCOTROL) 5 MG tablet Take 10 mg by mouth daily before breakfast.  03/22/19   [provider]  HYDROcodone-acetaminophen (  NORCO) 10-325 MG tablet Take 1 tablet by mouth every 6 (six) hours as needed (breakthrough pain). 03/26/22   Borders, Kirt Boys, NP  ibuprofen (ADVIL,MOTRIN) 200 MG tablet Take 400-800 mg by mouth every 8 (eight) hours as needed (for pain.).    [provider]  lisinopril-hydrochlorothiazide (ZESTORETIC) 20-25 MG tablet Take 1 tablet by mouth daily. 11/21/18   Karen Kitchens, NP  metFORMIN (GLUCOPHAGE) 1000 MG tablet Take 1 tablet (1,000 mg total) by mouth 2 (two) times daily. 11/21/18   Karen Kitchens, NP  omeprazole (PRILOSEC) 20 MG capsule TAKE 1 CAPSULE(20 MG) BY MOUTH TWICE DAILY 01/07/22   Lloyd Huger, MD  ondansetron  (ZOFRAN) 8 MG tablet Take 1 tablet (8 mg total) by mouth 2 (two) times daily as needed for refractory nausea / vomiting. 07/10/21   Lloyd Huger, MD  prochlorperazine (COMPAZINE) 10 MG tablet Take 1 tablet (10 mg total) by mouth every 6 (six) hours as needed (Nausea or vomiting). 07/10/21   Lloyd Huger, MD  TRELEGY ELLIPTA 200-62.5-25 MCG/INH AEPB Take 1 puff by mouth daily. 04/11/20   [provider]    Family History Family History  Problem Relation Age of Onset   Stroke Mother    Diabetes Mother    Hypertension Mother    Diabetes Father    Hypertension Father    Diabetes Sister    Diabetes Brother    Stroke Maternal Grandmother    Heart attack Paternal Uncle     Social History Social History   Tobacco Use   Smoking status: Some Days    Packs/day: 0.50    Types: Cigarettes   Smokeless tobacco: Never  Vaping Use   Vaping Use: Never used  Substance Use Topics   Alcohol use: No   Drug use: No     Allergies   Shrimp [shellfish allergy]   Review of Systems Review of Systems  Constitutional:  Positive for fatigue. Negative for fever.  HENT:  Positive for congestion and sore throat. Negative for rhinorrhea, sinus pressure and sinus pain.   Respiratory:  Positive for cough, shortness of breath and wheezing.   Cardiovascular:  Negative for chest pain.  Gastrointestinal:  Negative for abdominal pain, diarrhea, nausea and vomiting.  Musculoskeletal:  Negative for myalgias.  Neurological:  Negative for weakness, light-headedness and headaches.  Hematological:  Negative for adenopathy.     Physical Exam Triage Vital Signs ED Triage Vitals  Enc Vitals Group     BP 04/18/22 1758 (!) 178/96     Pulse Rate 04/18/22 1758 100     Resp 04/18/22 1758 (!) 30     Temp 04/18/22 1758 98.5 F (36.9 C)     Temp Source 04/18/22 1758 Oral     SpO2 04/18/22 1758 92 %     Weight 04/18/22 1756 175 lb (79.4 kg)     Height 04/18/22 1756 5\' 8"  (1.727 m)     Head  Circumference --      Peak Flow --      Pain Score 04/18/22 1755 0     Pain Loc --      Pain Edu? --      Excl. in [place]? --    No data found.  Updated Vital Signs BP 130/81 (BP Location: Left Arm)   Pulse 100   Temp 98.5 F (36.9 C) (Oral)   Resp (!) 30   Ht 5\' 8"  (1.727 m)   Wt 175 lb (79.4 kg)  SpO2 91%   BMI 26.61 kg/m      Physical Exam Vitals and nursing note reviewed.  Constitutional:      General: He is in acute distress (mild respiratory distress).     Appearance: Normal appearance. He is well-developed. He is not ill-appearing.  HENT:     Head: Normocephalic and atraumatic.     Right Ear: Tympanic membrane, ear canal and external ear normal.     Left Ear: Tympanic membrane, ear canal and external ear normal.     Nose: Nose normal.     Mouth/Throat:     Mouth: Mucous membranes are moist.     Pharynx: Oropharynx is clear. Posterior oropharyngeal erythema present.  Eyes:     General: No scleral icterus.    Conjunctiva/sclera: Conjunctivae normal.  Cardiovascular:     Rate and Rhythm: Normal rate and regular rhythm.     Heart sounds: No murmur heard. Pulmonary:     Effort: Respiratory distress (increased RR.  Patient pauses at times while speaking after every fewer this.  After the nebulizer treatment his respiratory rate does decrease and he is breathing better.  No longer in respiratory distress.) present.     Breath sounds: Rhonchi (diffuse rhonchi throughout all lung fields) present.  Musculoskeletal:     Cervical back: Neck supple.  Skin:    General: Skin is warm and dry.     Capillary Refill: Capillary refill takes less than 2 seconds.  Neurological:     General: No focal deficit present.     Mental Status: He is alert. Mental status is at baseline.     Motor: No weakness.     Gait: Gait normal.  Psychiatric:        Mood and Affect: Mood normal.        Behavior: Behavior normal.      UC Treatments / Results  Labs (all labs ordered are listed,  but only abnormal results are displayed) Labs Reviewed  RESP PANEL BY RT-PCR (FLU A&B, COVID) [id number]    EKG   Radiology DG Chest 2 View  Result Date: 04/18/2022 CLINICAL DATA:  Shortness of breath beginning yesterday. Fatigue. Asthma. Previous right lung carcinoma. EXAM: CHEST - 2 VIEW COMPARISON:  08/27/2019 FINDINGS: The heart size and mediastinal contours are within normal limits. Stable postoperative changes are seen in the right hemithorax. No evidence of acute infiltrate or edema. No evidence of pleural effusion. Pulmonary hyperinflation is again noted, consistent with COPD. IMPRESSION: COPD. No active cardiopulmonary disease. Stable postoperative changes in right hemithorax. Electronically Signed   By: Marlaine Hind M.D.   On: 04/18/2022 18:23    Procedures Procedures (including critical care time)  Medications Ordered in UC Medications  cefTRIAXone (ROCEPHIN) injection 1 g (has no administration in time range)  methylPREDNISolone sodium succinate (SOLU-MEDROL) 125 mg/2 mL injection 125 mg (has no administration in time range)  ipratropium-albuterol (DUONEB) 0.5-2.5 (3) MG/3ML nebulizer solution 3 mL (3 mLs Nebulization Given 04/18/22 1824)  ipratropium-albuterol (DUONEB) 0.5-2.5 (3) MG/3ML nebulizer solution 3 mL (3 mLs Nebulization Given 04/18/22 1854)    Initial Impression / Assessment and Plan / UC Course  I have reviewed the triage vital signs and the nursing notes.  Pertinent labs & imaging results that were available during my care of the patient were reviewed by me and considered in my medical decision making (see chart for details).   60 year old male with history of COPD/asthma, hypertension, diabetes and recurrent lung cancer presents for fatigue, sore throat, cough  and shortness of breath since yesterday.  BP elevated at 178/96.  Oxygen is 92%.  He is unsure of his baseline oxygen saturation.  His respiratory rate is increased and he is in mild respiratory distress.  He  pauses after every few words to take of breath.  On exam he does have diffuse rhonchi throughout all lung fields and mild posterior pharyngeal erythema.  Respiratory panel obtained as well as chest x-ray.  Patient given DuoNeb for acute shortness of breath.  Patient reported the DuoNeb did improve his breathing and oxygen increased to 94%.  Blood pressure also rechecked and was 130/81.  Reduce respiratory rate to normal.  No longer in respiratory distress.  Patient to be given a second DuoNeb treatment.  Oxygen staying around 93%.  Chest x-ray without evidence of pneumonia.  Respiratory panel is negative for flu and COVID.  Discussed all results with patient.  Suspect he is having exacerbation of his COPD.  We will treat him with prednisone, doxycycline and I sent prescription for DuoNeb solution.  Patient unsure if he will be able to pick up his prescriptions tonight.  Patient has been given 1 g Rocephin IM in clinic as well as 125 mg IM Solu-Medrol.  Hopefully this will keep him out of the ED.  Reviewed if his symptoms are worsening he should go to the emergency department.   Final Clinical Impressions(s) / UC Diagnoses   Final diagnoses:  COPD exacerbation ([place])  Shortness of breath  Productive cough     Discharge Instructions      -You are negative for COVID and flu.  Your chest x-ray does not show any evidence of pneumonia. - You are having an exacerbation of your COPD.  We have given you a couple breathing treatments in the clinic and I have sent the DuoNeb solution to the pharmacy for you to use in your nebulizer.  I also sent steroids and antibiotics.  Start oral prednisone tomorrow since you have been given an injection of a steroid tonight.  You may start the doxycycline tonight or wait until tomorrow.  We have given you an injection of antibiotic tonight.  Plenty of rest and fluids. - Return as needed but if you are having increased shortness of breath, fever or are feeling worse she  should go to the ER.     ED Prescriptions     Medication Sig Dispense Auth. Provider   predniSONE (DELTASONE) 50 MG tablet Take 1 tablet (50 mg total) by mouth daily for 5 days. 5 tablet Laurene Footman B, PA-C   doxycycline (VIBRAMYCIN) 100 MG capsule Take 1 capsule (100 mg total) by mouth 2 (two) times daily for 7 days. 14 capsule Laurene Footman B, PA-C   ipratropium-albuterol (DUONEB) 0.5-2.5 (3) MG/3ML SOLN Take 3 mLs by nebulization every 6 (six) hours as needed. 200 mL Danton Clap, PA-C      PDMP not reviewed this encounter.   Danton Clap, PA-C 04/18/22 1857    Danton Clap, PA-C 04/18/22 1926

## 2022-04-18 NOTE — ED Triage Notes (Addendum)
Pt reports low energy, SOB, sore throat. Started feeling poorly yesterday. Finished chemo and radiation in June.

## 2022-04-29 ENCOUNTER — Inpatient Hospital Stay: Payer: 59 | Attending: Oncology | Admitting: Hospice and Palliative Medicine

## 2022-04-29 DIAGNOSIS — C3492 Malignant neoplasm of unspecified part of left bronchus or lung: Secondary | ICD-10-CM | POA: Diagnosis not present

## 2022-04-29 DIAGNOSIS — M542 Cervicalgia: Secondary | ICD-10-CM | POA: Diagnosis not present

## 2022-04-29 DIAGNOSIS — M25519 Pain in unspecified shoulder: Secondary | ICD-10-CM | POA: Diagnosis not present

## 2022-04-29 MED ORDER — HYDROCODONE-ACETAMINOPHEN 10-325 MG PO TABS
1.0000 | ORAL_TABLET | Freq: Four times a day (QID) | ORAL | 0 refills | Status: DC | PRN
Start: 2022-04-29 — End: 2022-08-21

## 2022-04-29 MED ORDER — FENTANYL 25 MCG/HR TD PT72: 1.0000 | MEDICATED_PATCH | TRANSDERMAL | 0 refills | Status: DC

## 2022-04-29 NOTE — Progress Notes (Signed)
Virtual Visit via Telephone Note  I connected with Gabriel Mendez on 04/29/22 at 10:30 AM EST by telephone and verified that I am speaking with the correct person using two identifiers.  Location: Patient: Home Provider: Clinic   I discussed the limitations, risks, security and privacy concerns of performing an evaluation and management service by telephone and the availability of in person appointments. I also discussed with the patient that there may be a patient responsible charge related to this service. The patient expressed understanding and agreed to proceed.   History of Present Illness: Gabriel Mendez is a 61 y.o. 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  PET scan on September 27, 2019 confirmed recurrence.  Patient completed XRT in June 2021 for local recurrence and then CT scan in March 2022 revealed progression.  He is status post concurrent chemoradiation.  Palliative care was consulted to address pain.    Observations/Objective: I called and spoke with patient by phone.  He says that he has continued to have some upper respiratory symptoms following recent urgent care evaluation on 04/18/2022 but denies significant changes or concerns.  He continues to endorse persistent neck pain with radiculopathy.  He says that the fentanyl and Norco are helping with the pain.  We again discussed that this pain is not related to his cancer and therefore will be best managed by another specialist.  He is pending evaluation by neurosurgery later this week.  We will proceed with refilling his opioids for 1 more month until patient can be evaluated by neurosurgery.  He is also pending referral to the pain clinic.  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  Will refill  fentanyl and Norco for 1 more month until patient can be evaluated by neurosurgery later this week.  Follow Up Instructions: As needed   I discussed the assessment and treatment plan with the patient. The patient was provided an opportunity to ask questions and all were answered. The patient agreed with the plan and demonstrated an understanding of the instructions.   The patient was advised to call back or seek an in-person evaluation if the symptoms worsen or if the condition fails to improve as anticipated.  I provided 10 minutes of non-face-to-face time during this encounter.   Irean Hong, NP

## 2022-05-01 NOTE — Progress Notes (Deleted)
Referring Physician:  Lloyd Huger, MD [address] Gabriel Mendez,  [place] [address]  Primary Physician:  Lynnell Jude, MD  History of Present Illness: 05/01/2022*** [name] is currently being treated for recurrent lung CA. Also with history of asthma, COPD, DM.      He is currently taking norco 10 and using duragesic patches from oncology.    He has been referred to pain management as well.   Duration: *** Location: *** Quality: *** Severity: ***  Precipitating: aggravated by *** Modifying factors: made better by *** Weakness: none Timing: *** Bowel/Bladder Dysfunction: none  Conservative measures:  Physical therapy: ***  Multimodal medical therapy including regular antiinflammatories: duragesic patch, hydrocodone, motrin  Injections: *** epidural steroid injections  Past Surgery: ***  Gabriel Mendez has ***no symptoms of cervical myelopathy.  The symptoms are causing a significant impact on the patient's life.   Review of Systems:  A 10 point review of systems is negative, except for the pertinent positives and negatives detailed in the HPI.  Past Medical History: Past Medical History:  Diagnosis Date   Asthma    Cancer associated pain    COPD (chronic obstructive pulmonary disease) ([place])    Diabetes mellitus without complication ([place])    Hypertension    Lung cancer ([place]) 05/2017   Hx Chemo + rad tx's.   Pneumonia     Past Surgical History: Past Surgical History:  Procedure Laterality Date   ENDOBRONCHIAL ULTRASOUND N/A 05/12/2017   Procedure: ENDOBRONCHIAL ULTRASOUND;  Surgeon: Laverle Hobby, MD;  Location: [facility] ORS;  Service: Pulmonary;  Laterality: N/A;   FRACTURE SURGERY Left    ANKLE X 2   TONSILLECTOMY      Allergies: Allergies as of 05/03/2022 - Review Complete 04/18/2022  Allergen Reaction Noted   Shrimp [shellfish allergy] Nausea And Vomiting 05/15/2015    Medications: Outpatient Encounter Medications as of  05/03/2022  Medication Sig   albuterol (PROVENTIL) (2.5 MG/3ML) 0.083% nebulizer solution Take 3 mLs (2.5 mg total) by nebulization every 6 (six) hours as needed for wheezing or shortness of breath.   albuterol (VENTOLIN HFA) 108 (90 Base) MCG/ACT inhaler Inhale 2 puffs into the lungs every 6 (six) hours as needed for wheezing or shortness of breath.   amLODipine (NORVASC) 10 MG tablet Take 10 mg by mouth daily.   atorvastatin (LIPITOR) 20 MG tablet Take 20 mg by mouth daily.   fentaNYL (DURAGESIC) 25 MCG/HR Place 1 patch onto the skin every 3 (three) days.   glipiZIDE (GLUCOTROL) 5 MG tablet Take 10 mg by mouth daily before breakfast.    HYDROcodone-acetaminophen (NORCO) 10-325 MG tablet Take 1 tablet by mouth every 6 (six) hours as needed (breakthrough pain).   ibuprofen (ADVIL,MOTRIN) 200 MG tablet Take 400-800 mg by mouth every 8 (eight) hours as needed (for pain.).   ipratropium-albuterol (DUONEB) 0.5-2.5 (3) MG/3ML SOLN Take 3 mLs by nebulization every 6 (six) hours as needed.   lisinopril-hydrochlorothiazide (ZESTORETIC) 20-25 MG tablet Take 1 tablet by mouth daily.   metFORMIN (GLUCOPHAGE) 1000 MG tablet Take 1 tablet (1,000 mg total) by mouth 2 (two) times daily.   omeprazole (PRILOSEC) 20 MG capsule TAKE 1 CAPSULE(20 MG) BY MOUTH TWICE DAILY   ondansetron (ZOFRAN) 8 MG tablet Take 1 tablet (8 mg total) by mouth 2 (two) times daily as needed for refractory nausea / vomiting.   OXYGEN Place 2 L into the nose at bedtime as needed (Uses nightly O2).   prochlorperazine (COMPAZINE) 10  MG tablet Take 1 tablet (10 mg total) by mouth every 6 (six) hours as needed (Nausea or vomiting).   TRELEGY ELLIPTA 200-62.5-25 MCG/INH AEPB Take 1 puff by mouth daily.   No facility-administered encounter medications on file as of 05/03/2022.    Social History: Social History   Tobacco Use   Smoking status: Some Days    Packs/day: 0.50    Types: Cigarettes   Smokeless tobacco: Never  Vaping Use    Vaping Use: Never used  Substance Use Topics   Alcohol use: No   Drug use: No    Family Medical History: Family History  Problem Relation Age of Onset   Stroke Mother    Diabetes Mother    Hypertension Mother    Diabetes Father    Hypertension Father    Diabetes Sister    Diabetes Brother    Stroke Maternal Grandmother    Heart attack Paternal Uncle     Physical Examination: There were no vitals filed for this visit.  General: Patient is well developed, well nourished, calm, collected, and in no apparent distress. Attention to examination is appropriate.  Respiratory: Patient is breathing without any difficulty.   NEUROLOGICAL:     Awake, alert, oriented to person, place, and time.  Speech is clear and fluent. Fund of knowledge is appropriate.   Cranial Nerves: Pupils equal round and reactive to light.  Facial tone is symmetric.  Facial sensation is symmetric.  ROM of spine:  *** ROM of cervical spine *** pain *** ROM of lumbar spine *** pain  No abnormal lesions on exposed skin.   Strength: Side Biceps Triceps Deltoid Interossei Grip Wrist Ext. Wrist Flex.  R 5 5 5 5 5 5 5   L 5 5 5 5 5 5 5    Side Iliopsoas Quads Hamstring PF DF EHL  R 5 5 5 5 5 5   L 5 5 5 5 5 5    Reflexes are ***2+ and symmetric at the biceps, triceps, brachioradialis, patella and achilles.   Hoffman's is absent.  Clonus is not present.   Bilateral upper and lower extremity sensation is intact to light touch.    No evidence of dysmetria noted.  Gait is normal.   ***No difficulty with tandem gait.    Medical Decision Making  Imaging: CT of cervical spine dated 03/22/22:  FINDINGS: Alignment: Straightening of the cervical spine. No subluxation. Facet alignment within normal limits   Skull base and vertebrae: No acute fracture. No primary bone lesion or focal pathologic process.   Soft tissues and spinal canal: No prevertebral fluid or swelling. No visible canal hematoma.   Disc  levels:   At C2-C3, patent disc space. No canal stenosis. The foramen are patent bilaterally.   At C3-C4, patent disc space, no canal stenosis. The foramen are patent bilaterally.   At C4-C5, moderate severe disc space narrowing with vacuum disc and osteophyte. Small right paracentral disc protrusion. No canal stenosis. No high-grade foraminal narrowing.   At C5-C6, advanced disc space narrowing. Suspect small central disc protrusion with indentation of thecal sac. No high-grade canal stenosis. Facet degenerative changes. Mild bilateral foraminal narrowing.   At C6-C7, advanced disc space narrowing. No high-grade canal stenosis. Mild bilateral foraminal narrowing.   At C7-T1, patent disc space. No canal stenosis. The foramen are patent   Upper chest: Emphysema   Other: None   IMPRESSION: 1. 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 2. Emphysema  Electronically Signed   By: Donavan Foil M.D.   On: 03/23/2022 16:09   I have personally reviewed the images and agree with the above interpretation.  Assessment and Plan: Mr. Wacha is a pleasant 61 y.o. male with ***  Treatment options discussed with patient and following plan made:   - Order for physical therapy for *** spine ***. - Continue on current medications including ***. Reviewed proper dosing along with risks and benefits. Take and NSAIDs with food.      I spent a total of *** minutes in face-to-face and non-face-to-face activities related to this patient's care toda including review of outside records, review of imaging, review of symptoms, physical exam, discussion of differential diagnosis, discussion of treatment options, and documentation.   Thank you for involving me in the care of this patient.   Geronimo Boot PA-C Dept. of Neurosurgery

## 2022-05-03 ENCOUNTER — Ambulatory Visit: Payer: Self-pay | Admitting: Orthopedic Surgery

## 2022-05-17 DEATH — deceased

## 2022-06-28 ENCOUNTER — Ambulatory Visit: Admission: RE | Admit: 2022-06-28 | Payer: 59 | Source: Ambulatory Visit

## 2022-07-01 ENCOUNTER — Telehealth: Payer: Self-pay | Admitting: *Deleted

## 2022-07-01 NOTE — Telephone Encounter (Signed)
Saw that patient was a No show for his CT scan. Attempted to call pt to discuss rescheduling the scan and follow up MD appt. Call went straight to voicemail.

## 2022-07-02 ENCOUNTER — Inpatient Hospital Stay: Payer: Self-pay | Attending: Oncology | Admitting: Oncology

## 2022-07-02 ENCOUNTER — Inpatient Hospital Stay: Payer: Self-pay | Admitting: Hospice and Palliative Medicine

## 2022-07-02 ENCOUNTER — Encounter: Payer: Self-pay | Admitting: Oncology

## 2022-10-07 ENCOUNTER — Ambulatory Visit: Payer: 59 | Admitting: Radiation Oncology
